# Patient Record
Sex: Female | Born: 1935 | Race: White | Hispanic: No | Smoking: Former smoker
Health system: Southern US, Community
[De-identification: ages and names within clinical notes are randomized; demographics above are authoritative.]

## PROBLEM LIST (undated history)

## (undated) DIAGNOSIS — R053 Chronic cough: Secondary | ICD-10-CM

## (undated) DIAGNOSIS — Z79899 Other long term (current) drug therapy: Secondary | ICD-10-CM

## (undated) DIAGNOSIS — C439 Malignant melanoma of skin, unspecified: Secondary | ICD-10-CM

## (undated) DIAGNOSIS — Z9981 Dependence on supplemental oxygen: Secondary | ICD-10-CM

## (undated) DIAGNOSIS — R519 Headache, unspecified: Secondary | ICD-10-CM

## (undated) DIAGNOSIS — I471 Supraventricular tachycardia, unspecified: Secondary | ICD-10-CM

## (undated) DIAGNOSIS — I209 Angina pectoris, unspecified: Secondary | ICD-10-CM

## (undated) DIAGNOSIS — F419 Anxiety disorder, unspecified: Secondary | ICD-10-CM

## (undated) DIAGNOSIS — Z8719 Personal history of other diseases of the digestive system: Secondary | ICD-10-CM

## (undated) DIAGNOSIS — J329 Chronic sinusitis, unspecified: Secondary | ICD-10-CM

## (undated) DIAGNOSIS — I639 Cerebral infarction, unspecified: Secondary | ICD-10-CM

## (undated) DIAGNOSIS — E119 Type 2 diabetes mellitus without complications: Secondary | ICD-10-CM

## (undated) DIAGNOSIS — R51 Headache: Secondary | ICD-10-CM

## (undated) DIAGNOSIS — IMO0001 Reserved for inherently not codable concepts without codable children: Secondary | ICD-10-CM

## (undated) DIAGNOSIS — Z5189 Encounter for other specified aftercare: Secondary | ICD-10-CM

## (undated) DIAGNOSIS — R55 Syncope and collapse: Secondary | ICD-10-CM

## (undated) DIAGNOSIS — I4891 Unspecified atrial fibrillation: Secondary | ICD-10-CM

## (undated) DIAGNOSIS — F32A Depression, unspecified: Secondary | ICD-10-CM

## (undated) DIAGNOSIS — I10 Essential (primary) hypertension: Secondary | ICD-10-CM

## (undated) DIAGNOSIS — I519 Heart disease, unspecified: Secondary | ICD-10-CM

## (undated) DIAGNOSIS — M1990 Unspecified osteoarthritis, unspecified site: Secondary | ICD-10-CM

## (undated) DIAGNOSIS — E7800 Pure hypercholesterolemia, unspecified: Secondary | ICD-10-CM

## (undated) DIAGNOSIS — R05 Cough: Secondary | ICD-10-CM

## (undated) DIAGNOSIS — D649 Anemia, unspecified: Secondary | ICD-10-CM

## (undated) DIAGNOSIS — J449 Chronic obstructive pulmonary disease, unspecified: Secondary | ICD-10-CM

## (undated) DIAGNOSIS — I82409 Acute embolism and thrombosis of unspecified deep veins of unspecified lower extremity: Secondary | ICD-10-CM

## (undated) DIAGNOSIS — F329 Major depressive disorder, single episode, unspecified: Secondary | ICD-10-CM

## (undated) HISTORY — PX: CARDIAC CATHETERIZATION: SHX172

## (undated) HISTORY — PX: BACK SURGERY: SHX140

## (undated) HISTORY — PX: LUMBAR SPINE SURGERY: SHX701

## (undated) HISTORY — PX: VESICOVAGINAL FISTULA CLOSURE W/ TAH: SUR271

## (undated) HISTORY — PX: PALATE SURGERY: SHX729

## (undated) HISTORY — PX: SMALL INTESTINE SURGERY: SHX150

## (undated) HISTORY — DX: Chronic obstructive pulmonary disease, unspecified: J44.9

## (undated) HISTORY — PX: KNEE CARTILAGE SURGERY: SHX688

## (undated) HISTORY — DX: Supraventricular tachycardia: I47.1

## (undated) HISTORY — DX: Acute embolism and thrombosis of unspecified deep veins of unspecified lower extremity: I82.409

## (undated) HISTORY — PX: ABDOMINAL HYSTERECTOMY: SHX81

## (undated) HISTORY — PX: APPENDECTOMY: SHX54

## (undated) HISTORY — PX: TONSILLECTOMY AND ADENOIDECTOMY: SUR1326

## (undated) HISTORY — PX: ROTATOR CUFF REPAIR: SHX139

## (undated) HISTORY — DX: Supraventricular tachycardia, unspecified: I47.10

## (undated) HISTORY — PX: LUNG BIOPSY: SHX232

---

## 1991-02-14 HISTORY — PX: THORACOTOMY: SUR1349

## 1998-07-15 ENCOUNTER — Encounter: Payer: Self-pay | Admitting: Pulmonary Disease

## 1998-07-15 ENCOUNTER — Ambulatory Visit (HOSPITAL_COMMUNITY): Admission: RE | Admit: 1998-07-15 | Discharge: 1998-07-15 | Payer: Self-pay | Admitting: Pulmonary Disease

## 1999-01-12 ENCOUNTER — Encounter: Admission: RE | Admit: 1999-01-12 | Discharge: 1999-01-12 | Payer: Self-pay | Admitting: Pulmonary Disease

## 1999-01-31 ENCOUNTER — Other Ambulatory Visit: Admission: RE | Admit: 1999-01-31 | Discharge: 1999-01-31 | Payer: Self-pay | Admitting: Obstetrics and Gynecology

## 1999-02-14 HISTORY — PX: CARDIAC ELECTROPHYSIOLOGY STUDY AND ABLATION: SHX1294

## 1999-06-15 ENCOUNTER — Inpatient Hospital Stay (HOSPITAL_COMMUNITY): Admission: EM | Admit: 1999-06-15 | Discharge: 1999-06-16 | Payer: Self-pay | Admitting: *Deleted

## 1999-07-25 ENCOUNTER — Ambulatory Visit (HOSPITAL_COMMUNITY): Admission: RE | Admit: 1999-07-25 | Discharge: 1999-07-26 | Payer: Self-pay | Admitting: Internal Medicine

## 1999-07-25 ENCOUNTER — Encounter: Payer: Self-pay | Admitting: Internal Medicine

## 1999-07-31 ENCOUNTER — Emergency Department (HOSPITAL_COMMUNITY): Admission: EM | Admit: 1999-07-31 | Discharge: 1999-07-31 | Payer: Self-pay

## 1999-09-13 ENCOUNTER — Encounter: Payer: Self-pay | Admitting: Obstetrics and Gynecology

## 1999-09-13 ENCOUNTER — Encounter: Admission: RE | Admit: 1999-09-13 | Discharge: 1999-09-13 | Payer: Self-pay | Admitting: Obstetrics and Gynecology

## 1999-09-20 ENCOUNTER — Encounter: Admission: RE | Admit: 1999-09-20 | Discharge: 1999-09-20 | Payer: Self-pay | Admitting: Obstetrics and Gynecology

## 1999-09-20 ENCOUNTER — Encounter: Payer: Self-pay | Admitting: Obstetrics and Gynecology

## 2000-01-19 ENCOUNTER — Other Ambulatory Visit: Admission: RE | Admit: 2000-01-19 | Discharge: 2000-01-19 | Payer: Self-pay | Admitting: Obstetrics and Gynecology

## 2000-08-13 HISTORY — PX: BREAST BIOPSY: SHX20

## 2000-08-22 ENCOUNTER — Encounter: Payer: Self-pay | Admitting: Obstetrics and Gynecology

## 2000-08-22 ENCOUNTER — Encounter: Admission: RE | Admit: 2000-08-22 | Discharge: 2000-08-22 | Payer: Self-pay | Admitting: Obstetrics and Gynecology

## 2000-09-04 ENCOUNTER — Encounter (INDEPENDENT_AMBULATORY_CARE_PROVIDER_SITE_OTHER): Payer: Self-pay | Admitting: Specialist

## 2000-09-04 ENCOUNTER — Ambulatory Visit (HOSPITAL_BASED_OUTPATIENT_CLINIC_OR_DEPARTMENT_OTHER): Admission: RE | Admit: 2000-09-04 | Discharge: 2000-09-04 | Payer: Self-pay | Admitting: *Deleted

## 2000-10-05 ENCOUNTER — Encounter: Payer: Self-pay | Admitting: Obstetrics and Gynecology

## 2000-10-05 ENCOUNTER — Encounter: Admission: RE | Admit: 2000-10-05 | Discharge: 2000-10-05 | Payer: Self-pay | Admitting: Obstetrics and Gynecology

## 2001-01-14 ENCOUNTER — Other Ambulatory Visit: Admission: RE | Admit: 2001-01-14 | Discharge: 2001-01-14 | Payer: Self-pay | Admitting: Obstetrics and Gynecology

## 2001-04-26 ENCOUNTER — Encounter: Payer: Self-pay | Admitting: Emergency Medicine

## 2001-04-27 ENCOUNTER — Emergency Department (HOSPITAL_COMMUNITY): Admission: EM | Admit: 2001-04-27 | Discharge: 2001-04-27 | Payer: Self-pay | Admitting: Emergency Medicine

## 2001-04-27 ENCOUNTER — Encounter: Payer: Self-pay | Admitting: Emergency Medicine

## 2001-05-02 ENCOUNTER — Encounter: Payer: Self-pay | Admitting: Gastroenterology

## 2001-05-02 ENCOUNTER — Encounter: Admission: RE | Admit: 2001-05-02 | Discharge: 2001-05-02 | Payer: Self-pay | Admitting: Gastroenterology

## 2001-12-26 ENCOUNTER — Encounter: Payer: Self-pay | Admitting: Pulmonary Disease

## 2001-12-26 ENCOUNTER — Encounter: Admission: RE | Admit: 2001-12-26 | Discharge: 2001-12-26 | Payer: Self-pay | Admitting: Pulmonary Disease

## 2001-12-27 ENCOUNTER — Encounter: Payer: Self-pay | Admitting: Pulmonary Disease

## 2001-12-27 ENCOUNTER — Ambulatory Visit (HOSPITAL_COMMUNITY): Admission: RE | Admit: 2001-12-27 | Discharge: 2001-12-27 | Payer: Self-pay | Admitting: Pulmonary Disease

## 2002-02-04 ENCOUNTER — Other Ambulatory Visit: Admission: RE | Admit: 2002-02-04 | Discharge: 2002-02-04 | Payer: Self-pay | Admitting: Gynecology

## 2002-03-13 ENCOUNTER — Emergency Department (HOSPITAL_COMMUNITY): Admission: EM | Admit: 2002-03-13 | Discharge: 2002-03-13 | Payer: Self-pay | Admitting: Emergency Medicine

## 2002-03-13 ENCOUNTER — Encounter: Payer: Self-pay | Admitting: Emergency Medicine

## 2002-04-10 LAB — PULMONARY FUNCTION TEST

## 2002-04-15 ENCOUNTER — Emergency Department (HOSPITAL_COMMUNITY): Admission: EM | Admit: 2002-04-15 | Discharge: 2002-04-15 | Payer: Self-pay | Admitting: Emergency Medicine

## 2002-04-15 ENCOUNTER — Encounter: Payer: Self-pay | Admitting: Emergency Medicine

## 2002-09-14 ENCOUNTER — Encounter: Payer: Self-pay | Admitting: Emergency Medicine

## 2002-09-14 ENCOUNTER — Emergency Department (HOSPITAL_COMMUNITY): Admission: EM | Admit: 2002-09-14 | Discharge: 2002-09-14 | Payer: Self-pay | Admitting: Emergency Medicine

## 2002-09-19 ENCOUNTER — Ambulatory Visit (HOSPITAL_COMMUNITY): Admission: RE | Admit: 2002-09-19 | Discharge: 2002-09-19 | Payer: Self-pay | Admitting: Gastroenterology

## 2002-09-19 ENCOUNTER — Encounter (INDEPENDENT_AMBULATORY_CARE_PROVIDER_SITE_OTHER): Payer: Self-pay | Admitting: *Deleted

## 2003-02-09 ENCOUNTER — Encounter: Admission: RE | Admit: 2003-02-09 | Discharge: 2003-02-09 | Payer: Self-pay | Admitting: Pulmonary Disease

## 2003-05-12 ENCOUNTER — Ambulatory Visit (HOSPITAL_COMMUNITY): Admission: RE | Admit: 2003-05-12 | Discharge: 2003-05-12 | Payer: Self-pay | Admitting: Internal Medicine

## 2003-08-29 ENCOUNTER — Emergency Department (HOSPITAL_COMMUNITY): Admission: EM | Admit: 2003-08-29 | Discharge: 2003-08-29 | Payer: Self-pay | Admitting: Emergency Medicine

## 2004-02-25 ENCOUNTER — Encounter: Admission: RE | Admit: 2004-02-25 | Discharge: 2004-02-25 | Payer: Self-pay | Admitting: Gynecology

## 2004-03-02 ENCOUNTER — Ambulatory Visit: Payer: Self-pay | Admitting: Internal Medicine

## 2004-06-21 ENCOUNTER — Ambulatory Visit: Payer: Self-pay | Admitting: Internal Medicine

## 2004-07-01 ENCOUNTER — Ambulatory Visit: Payer: Self-pay | Admitting: Internal Medicine

## 2004-07-19 ENCOUNTER — Ambulatory Visit (HOSPITAL_COMMUNITY): Admission: RE | Admit: 2004-07-19 | Discharge: 2004-07-19 | Payer: Self-pay | Admitting: Internal Medicine

## 2004-07-19 LAB — PULMONARY FUNCTION TEST

## 2004-07-29 ENCOUNTER — Ambulatory Visit: Payer: Self-pay | Admitting: Internal Medicine

## 2004-11-23 ENCOUNTER — Ambulatory Visit: Payer: Self-pay | Admitting: Internal Medicine

## 2005-03-01 ENCOUNTER — Encounter: Admission: RE | Admit: 2005-03-01 | Discharge: 2005-03-01 | Payer: Self-pay | Admitting: Gynecology

## 2005-03-10 ENCOUNTER — Encounter: Admission: RE | Admit: 2005-03-10 | Discharge: 2005-03-10 | Payer: Self-pay | Admitting: Gynecology

## 2005-03-22 ENCOUNTER — Ambulatory Visit: Payer: Self-pay | Admitting: Internal Medicine

## 2005-04-06 ENCOUNTER — Other Ambulatory Visit: Admission: RE | Admit: 2005-04-06 | Discharge: 2005-04-06 | Payer: Self-pay | Admitting: Gynecology

## 2005-07-04 ENCOUNTER — Emergency Department (HOSPITAL_COMMUNITY): Admission: EM | Admit: 2005-07-04 | Discharge: 2005-07-04 | Payer: Self-pay | Admitting: Emergency Medicine

## 2005-07-19 ENCOUNTER — Ambulatory Visit: Payer: Self-pay | Admitting: Internal Medicine

## 2005-11-15 ENCOUNTER — Ambulatory Visit: Payer: Self-pay | Admitting: Internal Medicine

## 2006-03-05 ENCOUNTER — Encounter: Admission: RE | Admit: 2006-03-05 | Discharge: 2006-03-05 | Payer: Self-pay | Admitting: Gynecology

## 2006-03-19 ENCOUNTER — Ambulatory Visit: Payer: Self-pay | Admitting: Internal Medicine

## 2006-09-17 ENCOUNTER — Ambulatory Visit: Payer: Self-pay | Admitting: Internal Medicine

## 2007-01-18 ENCOUNTER — Telehealth: Payer: Self-pay | Admitting: Internal Medicine

## 2007-03-08 ENCOUNTER — Encounter: Admission: RE | Admit: 2007-03-08 | Discharge: 2007-03-08 | Payer: Self-pay | Admitting: Gynecology

## 2007-03-19 DIAGNOSIS — J302 Other seasonal allergic rhinitis: Secondary | ICD-10-CM

## 2007-03-19 DIAGNOSIS — J3089 Other allergic rhinitis: Secondary | ICD-10-CM

## 2007-03-20 ENCOUNTER — Ambulatory Visit: Payer: Self-pay | Admitting: Internal Medicine

## 2007-03-20 DIAGNOSIS — J449 Chronic obstructive pulmonary disease, unspecified: Secondary | ICD-10-CM | POA: Insufficient documentation

## 2007-03-20 DIAGNOSIS — R55 Syncope and collapse: Secondary | ICD-10-CM

## 2007-03-24 DIAGNOSIS — E119 Type 2 diabetes mellitus without complications: Secondary | ICD-10-CM | POA: Insufficient documentation

## 2007-03-25 ENCOUNTER — Ambulatory Visit: Payer: Self-pay | Admitting: Internal Medicine

## 2007-04-02 ENCOUNTER — Telehealth: Payer: Self-pay | Admitting: Internal Medicine

## 2007-04-02 DIAGNOSIS — J329 Chronic sinusitis, unspecified: Secondary | ICD-10-CM | POA: Insufficient documentation

## 2007-04-11 ENCOUNTER — Ambulatory Visit: Payer: Self-pay | Admitting: Internal Medicine

## 2007-04-16 ENCOUNTER — Ambulatory Visit: Payer: Self-pay | Admitting: Internal Medicine

## 2007-05-02 ENCOUNTER — Encounter: Payer: Self-pay | Admitting: Internal Medicine

## 2007-05-04 ENCOUNTER — Ambulatory Visit: Payer: Self-pay | Admitting: Cardiology

## 2007-05-04 ENCOUNTER — Observation Stay (HOSPITAL_COMMUNITY): Admission: EM | Admit: 2007-05-04 | Discharge: 2007-05-05 | Payer: Self-pay | Admitting: Emergency Medicine

## 2007-05-07 ENCOUNTER — Emergency Department (HOSPITAL_COMMUNITY): Admission: EM | Admit: 2007-05-07 | Discharge: 2007-05-07 | Payer: Self-pay | Admitting: Emergency Medicine

## 2007-05-08 ENCOUNTER — Ambulatory Visit: Payer: Self-pay

## 2007-05-15 ENCOUNTER — Telehealth (INDEPENDENT_AMBULATORY_CARE_PROVIDER_SITE_OTHER): Payer: Self-pay | Admitting: *Deleted

## 2007-05-16 ENCOUNTER — Ambulatory Visit: Payer: Self-pay | Admitting: Internal Medicine

## 2007-05-20 ENCOUNTER — Ambulatory Visit: Payer: Self-pay | Admitting: Internal Medicine

## 2007-08-20 ENCOUNTER — Ambulatory Visit: Payer: Self-pay | Admitting: Internal Medicine

## 2007-09-19 ENCOUNTER — Ambulatory Visit: Payer: Self-pay | Admitting: Internal Medicine

## 2007-12-18 ENCOUNTER — Telehealth (INDEPENDENT_AMBULATORY_CARE_PROVIDER_SITE_OTHER): Payer: Self-pay | Admitting: *Deleted

## 2008-03-09 ENCOUNTER — Encounter: Admission: RE | Admit: 2008-03-09 | Discharge: 2008-03-09 | Payer: Self-pay | Admitting: Gynecology

## 2008-03-17 ENCOUNTER — Ambulatory Visit: Payer: Self-pay | Admitting: Internal Medicine

## 2008-04-13 HISTORY — PX: US ECHOCARDIOGRAPHY: HXRAD669

## 2008-05-14 ENCOUNTER — Encounter: Admission: RE | Admit: 2008-05-14 | Discharge: 2008-05-14 | Payer: Self-pay | Admitting: Orthopedic Surgery

## 2008-05-26 ENCOUNTER — Telehealth: Payer: Self-pay | Admitting: Internal Medicine

## 2008-06-01 ENCOUNTER — Encounter: Payer: Self-pay | Admitting: Internal Medicine

## 2008-07-05 ENCOUNTER — Ambulatory Visit: Payer: Self-pay | Admitting: Internal Medicine

## 2008-07-05 ENCOUNTER — Emergency Department (HOSPITAL_COMMUNITY): Admission: EM | Admit: 2008-07-05 | Discharge: 2008-07-05 | Payer: Self-pay | Admitting: Family Medicine

## 2008-07-05 ENCOUNTER — Inpatient Hospital Stay (HOSPITAL_COMMUNITY): Admission: EM | Admit: 2008-07-05 | Discharge: 2008-07-07 | Payer: Self-pay | Admitting: Emergency Medicine

## 2008-07-06 ENCOUNTER — Encounter (INDEPENDENT_AMBULATORY_CARE_PROVIDER_SITE_OTHER): Payer: Self-pay | Admitting: Internal Medicine

## 2008-07-07 ENCOUNTER — Encounter: Payer: Self-pay | Admitting: Internal Medicine

## 2008-08-21 ENCOUNTER — Emergency Department (HOSPITAL_COMMUNITY): Admission: EM | Admit: 2008-08-21 | Discharge: 2008-08-21 | Payer: Self-pay | Admitting: Emergency Medicine

## 2008-09-14 ENCOUNTER — Ambulatory Visit: Payer: Self-pay | Admitting: Internal Medicine

## 2008-11-26 ENCOUNTER — Ambulatory Visit: Payer: Self-pay | Admitting: Internal Medicine

## 2008-12-07 ENCOUNTER — Emergency Department (HOSPITAL_COMMUNITY): Admission: EM | Admit: 2008-12-07 | Discharge: 2008-12-07 | Payer: Self-pay | Admitting: Emergency Medicine

## 2008-12-07 ENCOUNTER — Telehealth: Payer: Self-pay | Admitting: Internal Medicine

## 2008-12-29 ENCOUNTER — Telehealth: Payer: Self-pay | Admitting: Internal Medicine

## 2008-12-30 ENCOUNTER — Ambulatory Visit: Payer: Self-pay | Admitting: Internal Medicine

## 2008-12-30 DIAGNOSIS — R079 Chest pain, unspecified: Secondary | ICD-10-CM | POA: Insufficient documentation

## 2008-12-31 ENCOUNTER — Ambulatory Visit: Payer: Self-pay | Admitting: Internal Medicine

## 2008-12-31 ENCOUNTER — Encounter: Payer: Self-pay | Admitting: Internal Medicine

## 2009-01-04 ENCOUNTER — Telehealth (INDEPENDENT_AMBULATORY_CARE_PROVIDER_SITE_OTHER): Payer: Self-pay | Admitting: *Deleted

## 2009-01-05 ENCOUNTER — Ambulatory Visit: Payer: Self-pay | Admitting: Cardiology

## 2009-01-05 ENCOUNTER — Encounter (HOSPITAL_COMMUNITY): Admission: RE | Admit: 2009-01-05 | Discharge: 2009-02-12 | Payer: Self-pay | Admitting: Internal Medicine

## 2009-01-05 ENCOUNTER — Ambulatory Visit: Payer: Self-pay

## 2009-02-09 ENCOUNTER — Encounter: Payer: Self-pay | Admitting: Internal Medicine

## 2009-03-08 ENCOUNTER — Emergency Department (HOSPITAL_COMMUNITY): Admission: EM | Admit: 2009-03-08 | Discharge: 2009-03-08 | Payer: Self-pay | Admitting: Emergency Medicine

## 2009-03-09 ENCOUNTER — Inpatient Hospital Stay (HOSPITAL_COMMUNITY): Admission: EM | Admit: 2009-03-09 | Discharge: 2009-03-10 | Payer: Self-pay | Admitting: Emergency Medicine

## 2009-03-09 ENCOUNTER — Ambulatory Visit: Payer: Self-pay | Admitting: Cardiovascular Disease

## 2009-03-09 ENCOUNTER — Encounter (INDEPENDENT_AMBULATORY_CARE_PROVIDER_SITE_OTHER): Payer: Self-pay | Admitting: Internal Medicine

## 2009-03-09 ENCOUNTER — Ambulatory Visit: Payer: Self-pay | Admitting: Vascular Surgery

## 2009-03-09 HISTORY — PX: TRANSTHORACIC ECHOCARDIOGRAM: SHX275

## 2009-03-22 ENCOUNTER — Encounter: Payer: Self-pay | Admitting: Internal Medicine

## 2009-03-23 ENCOUNTER — Ambulatory Visit: Payer: Self-pay | Admitting: Internal Medicine

## 2009-04-01 ENCOUNTER — Ambulatory Visit: Payer: Self-pay | Admitting: Internal Medicine

## 2009-04-01 LAB — CONVERTED CEMR LAB
BUN: 16 mg/dL (ref 6–23)
Eosinophils Relative: 0.1 % (ref 0.0–5.0)
GFR calc non Af Amer: 86.53 mL/min (ref >60)
HCT: 36.2 % (ref 36.0–46.0)
Lymphs Abs: 1.2 10*3/uL (ref 0.7–4.0)
Monocytes Relative: 10.2 % (ref 3.0–12.0)
Neutrophils Relative %: 48.4 % (ref 43.0–77.0)
Platelets: 176 10*3/uL (ref 150.0–400.0)
Potassium: 5.1 meq/L (ref 3.5–5.1)
Prothrombin Time: 12.1 s — ABNORMAL HIGH (ref 9.1–11.7)
RBC: 3.65 M/uL — ABNORMAL LOW (ref 3.87–5.11)
Sodium: 140 meq/L (ref 135–145)
WBC: 2.9 10*3/uL — ABNORMAL LOW (ref 4.5–10.5)
aPTT: 29.5 s — ABNORMAL HIGH (ref 21.7–28.8)

## 2009-04-02 ENCOUNTER — Encounter: Admission: RE | Admit: 2009-04-02 | Discharge: 2009-04-02 | Payer: Self-pay | Admitting: Gynecology

## 2009-04-05 ENCOUNTER — Encounter: Payer: Self-pay | Admitting: Internal Medicine

## 2009-04-08 ENCOUNTER — Ambulatory Visit (HOSPITAL_COMMUNITY): Admission: RE | Admit: 2009-04-08 | Discharge: 2009-04-08 | Payer: Self-pay | Admitting: Internal Medicine

## 2009-04-08 ENCOUNTER — Ambulatory Visit: Payer: Self-pay | Admitting: Internal Medicine

## 2009-04-09 HISTORY — PX: LOOP RECORDER IMPLANT: SHX5954

## 2009-04-12 ENCOUNTER — Encounter: Payer: Self-pay | Admitting: Internal Medicine

## 2009-04-14 ENCOUNTER — Ambulatory Visit: Payer: Self-pay | Admitting: Internal Medicine

## 2009-04-20 ENCOUNTER — Observation Stay (HOSPITAL_COMMUNITY): Admission: EM | Admit: 2009-04-20 | Discharge: 2009-04-20 | Payer: Self-pay | Admitting: Emergency Medicine

## 2009-04-22 ENCOUNTER — Encounter: Payer: Self-pay | Admitting: Internal Medicine

## 2009-04-22 ENCOUNTER — Ambulatory Visit: Payer: Self-pay

## 2009-04-26 ENCOUNTER — Telehealth: Payer: Self-pay | Admitting: Internal Medicine

## 2009-05-12 ENCOUNTER — Ambulatory Visit: Payer: Self-pay | Admitting: Internal Medicine

## 2009-07-23 ENCOUNTER — Encounter: Payer: Self-pay | Admitting: Internal Medicine

## 2009-07-23 ENCOUNTER — Ambulatory Visit: Payer: Self-pay

## 2009-08-19 ENCOUNTER — Emergency Department (HOSPITAL_COMMUNITY): Admission: EM | Admit: 2009-08-19 | Discharge: 2009-08-19 | Payer: Self-pay | Admitting: Emergency Medicine

## 2009-09-02 ENCOUNTER — Encounter: Payer: Self-pay | Admitting: Internal Medicine

## 2009-09-09 ENCOUNTER — Ambulatory Visit: Payer: Self-pay | Admitting: Internal Medicine

## 2009-12-08 ENCOUNTER — Ambulatory Visit: Payer: Self-pay | Admitting: Internal Medicine

## 2009-12-15 ENCOUNTER — Encounter: Payer: Self-pay | Admitting: Internal Medicine

## 2009-12-28 ENCOUNTER — Ambulatory Visit: Payer: Self-pay | Admitting: Internal Medicine

## 2010-03-05 ENCOUNTER — Encounter: Payer: Self-pay | Admitting: Pulmonary Disease

## 2010-03-06 ENCOUNTER — Encounter: Payer: Self-pay | Admitting: Gynecology

## 2010-03-06 ENCOUNTER — Encounter: Payer: Self-pay | Admitting: Gastroenterology

## 2010-03-08 ENCOUNTER — Ambulatory Visit: Admit: 2010-03-08 | Payer: Self-pay | Admitting: Internal Medicine

## 2010-03-09 ENCOUNTER — Telehealth: Payer: Self-pay | Admitting: Internal Medicine

## 2010-03-15 NOTE — Progress Notes (Signed)
  Phone Note Refill Request Message from:  Fax from Pharmacy  Refills Requested: Medication #1:  METOPROLOL TARTRATE 25 MG TABS Take one half  tablet by mouth twice a day.   Notes: #[facility] [address] phone:[phone] fax: [phone] Fax request sent in Dr. Roxy Cedar name. Sent fax back with note stating responsible provider for this medication is Dr. Ladona Ridgel. Zackery Barefoot CMA  April 26, 2009 12:25 PM      Prescriptions: METOPROLOL TARTRATE 25 MG TABS (METOPROLOL TARTRATE) Take one half  tablet by mouth twice a day  #30 x 3   Entered by:   Laurance Flatten CMA   Authorized by:   Laren Boom, MD, [facility]   Signed by:   Laurance Flatten CMA on 04/26/2009   Method used:   [facility]ronically to        [facility] [address] [phone]* (retail)       [address]       [place], [place]  [address]       Ph: [phone] or [phone]       Fax: [phone]   RxID:   [id number]

## 2010-03-15 NOTE — Cardiovascular Report (Signed)
Summary: Office Visit   Office Visit   Imported By: Roderic Ovens 12/31/2009 16:26:44  _____________________________________________________________________  External Attachment:    Type:   Image     Comment:   External Document
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
## 2010-03-15 NOTE — Cardiovascular Report (Signed)
Summary: Office Visit   Office Visit   Imported By: Roderic Ovens 05/21/2009 14:02:28  _____________________________________________________________________  External Attachment:    Type:   Image     Comment:   External Document

## 2010-03-15 NOTE — Consult Note (Signed)
Summary: [facility]  [facility]   Imported By: Lester Holmen 02/16/2009 09:21:13  _____________________________________________________________________  External Attachment:    Type:   Image     Comment:   External Document

## 2010-03-15 NOTE — Procedures (Signed)
Summary: wch/ gd      Allergies Added:   Current Medications (verified): 1)  Singulair 10 Mg Tabs (Montelukast Sodium) .... Take 1 Tablet By Mouth Once A Day 2)  Proair Hfa 108 (90 Base) Mcg/act  Aers (Albuterol Sulfate) .... 2 Puffs Four Times A Day As Needed 3)  Spiriva Handihaler 18 Mcg  Caps (Tiotropium Bromide Monohydrate) .... Inhale 1 Capsule Once Daily; Do Not Swallow Capsule 4)  Tylenol Extra Strength 500 Mg Tabs (Acetaminophen) .... Use As Directed As Needed 5)  Actos 30 Mg Tabs (Pioglitazone Hcl) .... Take One Tablet By Mouth Once Daily. 6)  Af Allergy Relief 10 Mg Tbdp (Loratadine) .... Take One Tablet By Mouth Once Daily. 7)  Promethazine-Codeine 6.25-10 Mg/107ml Syrp (Promethazine-Codeine) .Marland Kitchen.. 1 Teaspoon Four Times A Day As Needed Cough 8)  Pravastatin Sodium 40 Mg Tabs (Pravastatin Sodium) .... Take One Tablet By Mouth Daily At Bedtime  Allergies (verified): 1)  ! * Contrast Dye 2)  ! Epinephrine   ILR Following MD [name] Bunting, MD DOI:  04/08/2009 Vendor:  [employer] Jude     Model Number:  [id number]     Serial Number [id number]       Tachy Episodes:  3     ILR Next Due 07/14/2009  Tech Comments:  Steri strips removed.  Incision well healed.   There were 73 tachy episodes recorded all but 3 were noise.  Episodes showed SVT 5-9 seconds with rates up to 190bpm.  [name] has not had any syncopal episodes since the loop was placed.  She was instructed to call us if she does and make a recording if possible.  Altha Harm, LPN  April 22, 2009 4:14 PM   MD Comments:  Will followup her SVT.
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
## 2010-03-15 NOTE — Cardiovascular Report (Signed)
Summary: Pre Op Orders  Pre Op Orders   Imported By: Roderic Ovens 03/31/2009 14:17:54  _____________________________________________________________________  External Attachment:    Type:   Image     Comment:   External Document
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
## 2010-03-15 NOTE — Miscellaneous (Signed)
Summary: Device preload  Clinical Lists Changes  Observations: Added new observation of ILR SERIAL: [id number]  (04/12/2009 13:58) Added new observation of ILR MODEL: [id number]  (04/12/2009 13:58) Added new observation of ILR VENDOR: St Jude  (04/12/2009 13:58) Added new observation of ILR DTOFINS: 04/08/2009  (04/12/2009 13:58) Added new observation of ILR MD: [name], MD  (04/12/2009 13:58)       ILR Following MD [name], MD DOI:  04/08/2009 Vendor:  [employer] Jude     Model Number:  [id number]     Serial Number [id number]
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
## 2010-03-15 NOTE — Miscellaneous (Signed)
  Clinical Lists Changes  Observations: Added new observation of ECHOINTERP:   Study Conclusions    - Left ventricle: Septal and inferior wall hypokinesis The cavity     size was mildly dilated. Wall thickness was normal. Systolic     function was mildly reduced. The estimated ejection fraction was     in the range of 45% to 50%.   - Mitral valve: Calcified annulus. Mildly thickened leaflets .   - Left atrium: The atrium was mildly dilated.   - Atrial septum: No defect or patent foramen ovale was identified.   Transthoracic echocardiography. M-mode, complete 2D, spectral   Doppler, and color Doppler. Patient status: Inpatient. Location:   ICU/CCU   Prepared and Electronically Authenticated by    [name], MD, [facility]   2011-01-25T18:[id number]  (03/09/2009 13:14)      Echocardiogram  Procedure date:  03/09/2009  Findings:        Study Conclusions    - Left ventricle: Septal and inferior wall hypokinesis The cavity     size was mildly dilated. Wall thickness was normal. Systolic     function was mildly reduced. The estimated ejection fraction was     in the range of 45% to 50%.   - Mitral valve: Calcified annulus. Mildly thickened leaflets .   - Left atrium: The atrium was mildly dilated.   - Atrial septum: No defect or patent foramen ovale was identified.   Transthoracic echocardiography. M-mode, complete 2D, spectral   Doppler, and color Doppler. Patient status: Inpatient. Location:   ICU/CCU   Prepared and Electronically Authenticated by    [name], MD, [facility]   2011-01-25T18:12:[id number]

## 2010-03-15 NOTE — Letter (Signed)
Summary: Implantable Device Instructions  Architectural technologist, Main Office  [address]   [place], [place] [address]   Phone: [phone]  Fax: [phone]      Implantable Device Instructions  You are scheduled for:  _____ Implantable Loop Recorder   on 2/24/11with Dr. Ladona Ridgel  1.  Please arrive at the Short Stay Center at [facility] at 11:00am on the day of your procedure.  2.  Do not eat or drink after midnight the night before your procedure.  3.  Complete lab work on 04/01/09.  The lab at [facility] is open from 8:30 AM to 1:30 PM and from 2:30 PM to 5:00 PM. You do not have to be fasting.  4.  Do NOT take these medications for the day of your procedure: .  5.  Plan for an overnight stay.  Bring your insurance cards and a list of your medications.  6.  Wash your chest and neck with antibacterial soap (any brand) the evening before and the morning of your procedure.  Rinse well.   *If you have ANY questions after you get home, please call the office [phone]. [name]  *Every attempt is made to prevent procedures from being rescheduled.  Due to the nauture of Electrophysiology, rescheduling can happen.  The physician is always aware and directs the staff when this occurs.

## 2010-03-15 NOTE — Letter (Signed)
Summary: [facility]ology [facility] Diabetes  [facility]   Imported By: Sherian Rein 12/30/2009 12:41:21  _____________________________________________________________________  External Attachment:    Type:   Image     Comment:   External Document
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
## 2010-03-15 NOTE — Medication Information (Signed)
Summary: Prometh/Codeine/[facility]  Prometh/Codeine/[facility]   Imported By: Sherian Rein 04/09/2009 09:17:11  _____________________________________________________________________  External Attachment:    Type:   Image     Comment:   External Document

## 2010-03-15 NOTE — Cardiovascular Report (Signed)
Summary: Office Visit   Office Visit   Imported By: Roderic Ovens 08/07/2009 12:17:49  _____________________________________________________________________  External Attachment:    Type:   Image     Comment:   External Document
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
## 2010-03-15 NOTE — Procedures (Signed)
Summary: device/saf    Allergies: 1)  ! * Contrast Dye 2)  ! Epinephrine   ILR Following MD [name], MD DOI:  04/08/2009 Vendor:  [employer] Jude     Model Number:  [id number]     Serial Number [id number]       Tachy Episodes:  73     Brady Episodes:  0 ILR Next Due 10/14/2009  Tech Comments:  All 73 tachy episodes oversensing.  Battery longevity >36 mths.  Changed Sensitivity from 0.09 to 0.33mV and EGM Dynamic Range from 0.37mV to 0.10mV. Checked by [employer].  Vella Kohler  July 28, 2009 8:47 AM

## 2010-03-15 NOTE — Letter (Signed)
Summary: [place] [facility] Office Progress Note    Endo Office Progress Note   Imported By: Roderic Ovens 09/22/2009 11:43:44  _____________________________________________________________________  External Attachment:    Type:   Image     Comment:   External Document

## 2010-03-17 NOTE — Letter (Signed)
Summary: [place] [facility] Office Progress Note   [facility] Progress Note   Imported By: Roderic Ovens 02/01/2010 16:21:06  _____________________________________________________________________  External Attachment:    Type:   Image     Comment:   External Document

## 2010-03-17 NOTE — Progress Notes (Signed)
Summary: nos appt  Phone Note Call from Patient   Caller: [email or web address]  Call For: Tennis Mckinnon Summary of Call: [facility] nos from 1/24 to 2/28. Initial call taken by: Darletta Moll,  March 09, 2010 11:17 AM

## 2010-03-31 ENCOUNTER — Encounter (INDEPENDENT_AMBULATORY_CARE_PROVIDER_SITE_OTHER): Payer: Medicare Other

## 2010-03-31 ENCOUNTER — Encounter: Payer: Self-pay | Admitting: Internal Medicine

## 2010-03-31 DIAGNOSIS — R55 Syncope and collapse: Secondary | ICD-10-CM

## 2010-04-01 ENCOUNTER — Emergency Department (HOSPITAL_COMMUNITY): Payer: Medicare Other

## 2010-04-01 ENCOUNTER — Inpatient Hospital Stay (HOSPITAL_COMMUNITY)
Admission: EM | Admit: 2010-04-01 | Discharge: 2010-04-04 | DRG: 189 | Disposition: A | Discharge status: Home / Self Care | Payer: Medicare Other | Attending: Pulmonary Disease | Admitting: Pulmonary Disease

## 2010-04-01 DIAGNOSIS — J309 Allergic rhinitis, unspecified: Secondary | ICD-10-CM | POA: Diagnosis present

## 2010-04-01 DIAGNOSIS — Z85828 Personal history of other malignant neoplasm of skin: Secondary | ICD-10-CM

## 2010-04-01 DIAGNOSIS — J9600 Acute respiratory failure, unspecified whether with hypoxia or hypercapnia: Principal | ICD-10-CM | POA: Diagnosis present

## 2010-04-01 DIAGNOSIS — J069 Acute upper respiratory infection, unspecified: Secondary | ICD-10-CM | POA: Diagnosis present

## 2010-04-01 DIAGNOSIS — G43909 Migraine, unspecified, not intractable, without status migrainosus: Secondary | ICD-10-CM | POA: Diagnosis present

## 2010-04-01 DIAGNOSIS — E119 Type 2 diabetes mellitus without complications: Secondary | ICD-10-CM | POA: Diagnosis present

## 2010-04-01 DIAGNOSIS — E785 Hyperlipidemia, unspecified: Secondary | ICD-10-CM | POA: Diagnosis present

## 2010-04-01 DIAGNOSIS — J441 Chronic obstructive pulmonary disease with (acute) exacerbation: Secondary | ICD-10-CM | POA: Diagnosis present

## 2010-04-01 DIAGNOSIS — J019 Acute sinusitis, unspecified: Secondary | ICD-10-CM | POA: Diagnosis present

## 2010-04-01 DIAGNOSIS — I10 Essential (primary) hypertension: Secondary | ICD-10-CM | POA: Diagnosis present

## 2010-04-01 LAB — DIFFERENTIAL
Basophils Absolute: 0 10*3/uL (ref 0.0–0.1)
Basophils Relative: 1 % (ref 0–1)
Eosinophils Absolute: 0 10*3/uL (ref 0.0–0.7)
Monocytes Relative: 11 % (ref 3–12)
Neutrophils Relative %: 39 % — ABNORMAL LOW (ref 43–77)

## 2010-04-01 LAB — POCT I-STAT 3, ART BLOOD GAS (G3+)
O2 Saturation: 99 %
Patient temperature: 98.6
TCO2: 28 mmol/L (ref 0–100)
pH, Arterial: 7.294 — ABNORMAL LOW (ref 7.350–7.400)
pH, Arterial: 7.391 (ref 7.350–7.400)

## 2010-04-01 LAB — BASIC METABOLIC PANEL
BUN: 19 mg/dL (ref 6–23)
CO2: 27 mEq/L (ref 19–32)
Chloride: 103 mEq/L (ref 96–112)
Creatinine, Ser: 1.08 mg/dL (ref 0.4–1.2)

## 2010-04-01 LAB — CBC
MCH: 32.7 pg (ref 26.0–34.0)
Platelets: 203 10*3/uL (ref 150–400)
RBC: 3.67 MIL/uL — ABNORMAL LOW (ref 3.87–5.11)
WBC: 4.7 10*3/uL (ref 4.0–10.5)

## 2010-04-01 LAB — POCT CARDIAC MARKERS: Troponin i, poc: <0.05 ng/mL (ref 0.00–0.09)

## 2010-04-02 DIAGNOSIS — J441 Chronic obstructive pulmonary disease with (acute) exacerbation: Secondary | ICD-10-CM

## 2010-04-02 LAB — GLUCOSE, CAPILLARY
Glucose-Capillary: 91 mg/dL (ref 70–99)
Glucose-Capillary: 161 mg/dL — ABNORMAL HIGH (ref 70–99)
Glucose-Capillary: 132 mg/dL — ABNORMAL HIGH (ref 70–99)
Glucose-Capillary: 134 mg/dL — ABNORMAL HIGH (ref 70–99)

## 2010-04-02 LAB — BASIC METABOLIC PANEL
BUN: 16 mg/dL (ref 6–23)
Calcium: 9.2 mg/dL (ref 8.4–10.5)
GFR calc non Af Amer: >60 mL/min (ref >60)
Glucose, Bld: 184 mg/dL — ABNORMAL HIGH (ref 70–99)

## 2010-04-02 LAB — HEMOGLOBIN A1C: Hgb A1c MFr Bld: 6.3 % — ABNORMAL HIGH (ref <5.7)

## 2010-04-02 LAB — POCT I-STAT 3, ART BLOOD GAS (G3+)
O2 Saturation: 96 %
Patient temperature: 98.6
TCO2: 28 mmol/L (ref 0–100)

## 2010-04-02 LAB — CARDIAC PANEL(CRET KIN+CKTOT+MB+TROPI): Troponin I: 0.01 ng/mL (ref 0.00–0.06)

## 2010-04-02 LAB — CBC
HCT: 33.5 % — ABNORMAL LOW (ref 36.0–46.0)
MCHC: 33.4 g/dL (ref 30.0–36.0)
MCV: 98 fL (ref 78.0–100.0)
RDW: 14.1 % (ref 11.5–15.5)

## 2010-04-03 LAB — GLUCOSE, CAPILLARY
Glucose-Capillary: 128 mg/dL — ABNORMAL HIGH (ref 70–99)
Glucose-Capillary: 91 mg/dL (ref 70–99)
Glucose-Capillary: 106 mg/dL — ABNORMAL HIGH (ref 70–99)
Glucose-Capillary: 200 mg/dL — ABNORMAL HIGH (ref 70–99)

## 2010-04-04 ENCOUNTER — Telehealth (INDEPENDENT_AMBULATORY_CARE_PROVIDER_SITE_OTHER): Payer: Self-pay | Admitting: *Deleted

## 2010-04-04 LAB — GLUCOSE, CAPILLARY
Glucose-Capillary: 198 mg/dL — ABNORMAL HIGH (ref 70–99)
Glucose-Capillary: 92 mg/dL (ref 70–99)

## 2010-04-04 LAB — BASIC METABOLIC PANEL
BUN: 16 mg/dL (ref 6–23)
Creatinine, Ser: 0.79 mg/dL (ref 0.4–1.2)
GFR calc non Af Amer: >60 mL/min (ref >60)
Glucose, Bld: 101 mg/dL — ABNORMAL HIGH (ref 70–99)
Potassium: 3.7 mEq/L (ref 3.5–5.1)

## 2010-04-05 ENCOUNTER — Encounter: Payer: Self-pay | Admitting: Internal Medicine
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
## 2010-04-07 NOTE — Discharge Summary (Addendum)
NAMEHISAYO, [name]               ACCOUNT NO.:  [id number]  MEDICAL RECORD NO.:  [id number]           PATIENT TYPE:  I  LOCATION:  5153                         FACILITY:  [facility]  PHYSICIAN:  Clinton D. Maple Hudson, MD, FCCP, FACPDATE OF BIRTH:  09[birth year]Jun-1933  DATE OF ADMISSION:  04/01/2010 DATE OF DISCHARGE:  04/04/2010                              DISCHARGE SUMMARY   DISCHARGE DIAGNOSES: 1. Acute hypercapnic respiratory failure. 2. Acute chronic obstructive pulmonary disease exacerbation.  LABORATORY DATA:  On April 04, 2010, BMP demonstrates sodium 138, potassium 3.7, chloride 104, CO2 28, glucose 101, BUN 16, creatinine 0.79, calcium 9.4.  On April 02, 2010, hemoglobin A1c was 6.3.  On April 02, 2010, CBC demonstrates WBC 3.8, hemoglobin 11.2, hematocrit 33.5, platelet count 172.  MICROBIOLOGY DATA:  On April 01, 2010, blood cultures x2 demonstrate no growth on preliminary results, final results pending.  On April 02, 2010, MRSA nasal PCR was negative.  RADIOLOGIC DATA:  On April 01, 2010, 1 view of the chest demonstrates changes consistent with COPD with no acute infiltrate or abnormality.  HISTORY OF PRESENT ILLNESS:  [name] is a 77 year old female with a past medical history of diabetes, COPD/asthma, hypertension, dyslipidemia, history of syncopal episodes status post implantable loop recorder, seasonal allergies, allergic rhinitis, and sinusitis, who was admitted to [facility] on April 01, 2010, secondary to worsening shortness of breath, chest tightness, and hypercarbic respiratory failure.  Her admission ABG demonstrated a pH of 7.29, PCO2 of 16, and PO2 of 155 with bicarbonate of 29.3.  At that time, she was not able to speak in full sentences and required BiPAP therapy.  She was placed on IV Avelox and IV Solu-Medrol with nebulized bronchodilators and made significant improvements over the course of her hospital admission.  The night prior to  discharge, she did have an episode consistent with an aura for migraine on the morning of discharge, is now resolved without numbness, headache, or residual pain.  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  She will continue on Avelox therapy for a total of 7 days post discharge and complete prednisone taper.  She is scheduled to follow up with Dr. Jetty Duhamel on April 12, 2010. Please see below.  HOSPITAL COURSE BY DISCHARGE DIAGNOSES: 1. Acute hypercapnic respiratory failure.  As per HPI, [name]     presented to [facility] with worsening shortness of breath     and chest tightness that was likely consistent with acute COPD     exacerbation.  Her initial ABG demonstrated pH of 7.29 with CO2 of     60 and a PO2 of 155.  At the time of presentation, she was unable     to complete full sentences and was in significant distress.  She     did require initiation of BiPAP therapy which did significantly     improve her work of breathing.  She was placed on IV Avelox and IV     Solu-Medrol with nebulized bronchodilators.  At the time of     discharge, she was transitioned from IV medications to p.o.  She     will continue post discharge on Avelox and prednisone taper. 2. Acute COPD exacerbation.  Please see above for details.  DISCHARGE INSTRUCTIONS: 1. Activity, increase activity slowly and as tolerated. 2. Heart-healthy diet.  FOLLOWUP:  She is scheduled to follow up with Dr. Jetty Duhamel, [facility], on April 12, 2010, at 1:45 p.m.  DISCHARGE MEDICATIONS: 1. Fluticasone 50-mcg spray, 2 sprays nasally daily.  This will be     continued post hospitalization, however, she will not be given a     script.  If she feels this significantly improves symptoms, we will     continue with a prescription after evaluation in the office.  Her     hospital Flonase was given to her at  the time of discharge. 2. Avelox 400 mg by mouth daily. 3. Prednisone 10-mg tablet 4 tablets daily for 3 days; then 2 tablets     daily for 3 days; then 1 tab daily for 3 days; and stop. 4. Nasal saline spray, 1 spray nasally as needed. 5. Actos 30 mg 1 tablet by mouth daily. 6. Loratadine 10 mg 1 tablet by mouth daily. 7. Losartan 50 mg 1 tablet by mouth daily. 8. Multivitamin OTC 1 tablet by mouth daily. 9. Naproxen sodium 220 mg 1 tablet by mouth daily as needed for pain. 10.Pravachol 40 mg by mouth daily. 11.ProAir 1 puff inhaled twice daily as needed for shortness of breath     or wheezing. 12.Singulair 10 mg 1 tablet by mouth daily. 13.Tylenol Extra Strength 500 mg 1 tablet by mouth every 4 hours as     needed for pain. 14.Spiriva 18 mcg 1 capsule inhaled daily at bedtime. 15.Vitamin D2 50,000 units 1 tablet by mouth every Saturday.  DISPOSITION:  At the time of discharge, [name] has met maximum benefit of inpatient therapy and is currently medically stable and cleared for discharge pending followup as mentioned above.  Time spent on disposition greater than 35 minutes.     Canary Brim, NP   ______________________________ Rennis Chris. Maple Hudson, MD, FCCP, FACP    BO/MEDQ  D:  04/04/2010  T:  04/05/2010  Job:  [id number]  Electronically Signed by Canary Brim  on 04/07/2010 02:34:45 PM Electronically Signed by Jetty Duhamel MD FCCP FACP on 04/12/2010 10:17:07 PM

## 2010-04-08 LAB — CULTURE, BLOOD (ROUTINE X 2): Culture: NO GROWTH

## 2010-04-12 ENCOUNTER — Encounter: Payer: Self-pay | Admitting: Internal Medicine

## 2010-04-12 ENCOUNTER — Ambulatory Visit (INDEPENDENT_AMBULATORY_CARE_PROVIDER_SITE_OTHER): Payer: Medicare Other | Admitting: Internal Medicine

## 2010-04-12 DIAGNOSIS — J9610 Chronic respiratory failure, unspecified whether with hypoxia or hypercapnia: Secondary | ICD-10-CM

## 2010-04-12 DIAGNOSIS — J449 Chronic obstructive pulmonary disease, unspecified: Secondary | ICD-10-CM

## 2010-04-12 NOTE — Progress Notes (Signed)
Summary: pharmacy calling/ pt is there waiting  Phone Note From Pharmacy   Caller: elizabeth w/ [facility] Call For: young  Summary of Call: per caller: pt is at pharmacy now and disputes the directions re: prednisone. caller says this is a new rx dated today. [phone] Initial call taken by: Tivis Ringer, CNA,  April 04, 2010 3:38 PM  Follow-up for Phone Call        I spoke with [facility]Elizabeth states that the son of the pt is questioning the Prednisone RX-I had the pharmacy fax over the Rx as I didnt see any in EMR-pt was d/c from hospital today-the Rx given by Canary Brim is correct; called the pharmacy to confirm this. Reynaldo Minium CMA  April 04, 2010 4:01 PM

## 2010-04-15 ENCOUNTER — Telehealth (INDEPENDENT_AMBULATORY_CARE_PROVIDER_SITE_OTHER): Payer: Self-pay | Admitting: *Deleted
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
## 2010-04-21 NOTE — Letter (Signed)
Summary: [facility]  [facility]   Imported By: Lennie Odor 04/12/2010 14:18:13  _____________________________________________________________________  External Attachment:    Type:   Image     Comment:   External Document

## 2010-04-21 NOTE — Progress Notes (Signed)
Summary: increased sob  Phone Note Call from Patient Call back at Home Phone [phone]   Caller: Patient Call For: young Reason for Call: Talk to Nurse Summary of Call: Patient calling states she is very sob starting today around lunch time.  She recently was in hospital for 4 days at the end of Feb.  She is asking if there is something preventive she could do or take to help with sob, she does not want to go back to the hospital. [facility] Initial call taken by: Lehman Prom,  April 15, 2010 2:59 PM  Follow-up for Phone Call        Spoke with pt.  She states that she started to have some increased SOB arounf noon today.  She states that she gets out of breath with or without any exertion.  She has had very little dry cough. Denies any fever, wheeze, CP, or other c/o's.  She states that she uses proair and this helps, but not for too long.  No openings this pm.  Pls advise thanks allergic to contrast dye and epinephrine Follow-up by: Vernie Murders,  April 15, 2010 3:31 PM  Additional Follow-up for Phone Call Additional follow up Details #1::        per CY---prednisone 20mg    #3  1 daily through the weekend.  no refills.  thanks Randell Loop CMA  April 15, 2010 3:36 PM     Additional Follow-up for Phone Call Additional follow up Details #2::    Spoke with pt and notified of recs per CDY.  She verbalized understanding and rx was sent to pharm.  Follow-up by: Vernie Murders,  April 15, 2010 4:09 PM  New/Updated Medications: PREDNISONE 20 MG TABS (PREDNISONE) 1 by mouth once daily Prescriptions: PREDNISONE 20 MG TABS (PREDNISONE) 1 by mouth once daily  #3 x 0   Entered by:   Vernie Murders   Authorized by:   Waymon Budge MD   Signed by:   Vernie Murders on 04/15/2010   Method used:   [facility]        [facility] [address] [phone]* (retail)       [id number]       [place], [place]  [address]       Ph: [phone] or [phone]  Fax: [phone]   RxID:   [id number]

## 2010-04-21 NOTE — Procedures (Signed)
Summary: device check/sl  [facility]      Allergies Added:   Current Medications (verified): 1)  Singulair 10 Mg Tabs (Montelukast Sodium) .... Take 1 Tablet By Mouth Once A Day 2)  Proair Hfa 108 (90 Base) Mcg/act  Aers (Albuterol Sulfate) .... 2 Puffs Four Times A Day As Needed 3)  Spiriva Handihaler 18 Mcg  Caps (Tiotropium Bromide Monohydrate) .... Inhale 1 Capsule Once Daily; Do Not Swallow Capsule 4)  Tylenol Extra Strength 500 Mg Tabs (Acetaminophen) .... Use As Directed As Needed 5)  Actos 30 Mg Tabs (Pioglitazone Hcl) .... Take One Tablet By Mouth Once Daily. 6)  Af Allergy Relief 10 Mg Tbdp (Loratadine) .... Take One Tablet By Mouth Once Daily. 7)  Pravastatin Sodium 40 Mg Tabs (Pravastatin Sodium) .... Take One Tablet By Mouth Daily At Bedtime 8)  Losartan Potassium 50 Mg Tabs (Losartan Potassium) .... Take One Tablet By Mouth Once Daily. 9)  Vitamin D .... Weekly 10)  Metoprolol Tartrate 25 Mg Tabs (Metoprolol Tartrate) .... 1/2 Tablet By Mouth Two Times A Day  Allergies (verified): 1)  ! * Contrast Dye 2)  ! Epinephrine   ILR Following MD [name], MD DOI:  04/08/2009 Vendor:  [employer]     Model Number:  [id number]     Serial Number [id number]       Tachy Episodes:  49     Brady Episodes:  24 ILR Next Due 06/14/2010  Tech Comments:  49 TACHY EPISODES AND 24 BRADY EPISODES.  NOT TRUE EPISODES. ROV IN 3 MTHS W/DEVICE CLINIC. Vella Kohler  April 06, 2010 4:55 PM

## 2010-04-26 NOTE — Letter (Signed)
Summary: [facility]  [facility]   Imported By: Marylou Mccoy 04/22/2010 13:01:39  _____________________________________________________________________  External Attachment:    Type:   Image     Comment:   External Document

## 2010-04-26 NOTE — Cardiovascular Report (Signed)
Summary: Office Visit   Office Visit   Imported By: Roderic Ovens 04/22/2010 14:54:37  _____________________________________________________________________  External Attachment:    Type:   Image     Comment:   External Document

## 2010-05-01 LAB — DIFFERENTIAL
Lymphocytes Relative: 29 % (ref 12–46)
Lymphs Abs: 2 10*3/uL (ref 0.7–4.0)
Monocytes Relative: 8 % (ref 3–12)
Neutro Abs: 4.4 10*3/uL (ref 1.7–7.7)
Neutrophils Relative %: 63 % (ref 43–77)

## 2010-05-01 LAB — COMPREHENSIVE METABOLIC PANEL
CO2: 22 mEq/L (ref 19–32)
Calcium: 8.9 mg/dL (ref 8.4–10.5)
Creatinine, Ser: 0.73 mg/dL (ref 0.4–1.2)
GFR calc non Af Amer: >60 mL/min (ref >60)
Glucose, Bld: 162 mg/dL — ABNORMAL HIGH (ref 70–99)
Total Protein: 6.5 g/dL (ref 6.0–8.3)

## 2010-05-01 LAB — CBC
HCT: 35.5 % — ABNORMAL LOW (ref 36.0–46.0)
Hemoglobin: 12 g/dL (ref 12.0–15.0)
MCH: 34.1 pg — ABNORMAL HIGH (ref 26.0–34.0)
MCHC: 34 g/dL (ref 30.0–36.0)
RDW: 13.9 % (ref 11.5–15.5)

## 2010-05-01 LAB — POCT I-STAT 3, ART BLOOD GAS (G3+)
Bicarbonate: 23.5 mEq/L (ref 20.0–24.0)
pCO2 arterial: 42.9 mmHg (ref 35.0–45.0)
pH, Arterial: 7.347 — ABNORMAL LOW (ref 7.350–7.400)
pO2, Arterial: 76 mmHg — ABNORMAL LOW (ref 80.0–100.0)

## 2010-05-01 LAB — PROTIME-INR: Prothrombin Time: 14.1 seconds (ref 11.6–15.2)

## 2010-05-01 LAB — BRAIN NATRIURETIC PEPTIDE: Pro B Natriuretic peptide (BNP): 148 pg/mL — ABNORMAL HIGH (ref 0.0–100.0)

## 2010-05-02 LAB — CARDIAC PANEL(CRET KIN+CKTOT+MB+TROPI)
CK, MB: 1.9 ng/mL (ref 0.3–4.0)
CK, MB: 2 ng/mL (ref 0.3–4.0)
Relative Index: 2 (ref 0.0–2.5)
Total CK: 85 U/L (ref 7–177)

## 2010-05-02 LAB — DIFFERENTIAL
Basophils Absolute: 0 10*3/uL (ref 0.0–0.1)
Eosinophils Relative: 0 % (ref 0–5)
Lymphocytes Relative: 18 % (ref 12–46)
Monocytes Absolute: 0.3 10*3/uL (ref 0.1–1.0)
Monocytes Relative: 7 % (ref 3–12)
Neutro Abs: 3.2 10*3/uL (ref 1.7–7.7)

## 2010-05-02 LAB — URINALYSIS, ROUTINE W REFLEX MICROSCOPIC
Bilirubin Urine: NEGATIVE
Glucose, UA: NEGATIVE mg/dL
Ketones, ur: NEGATIVE mg/dL
Specific Gravity, Urine: 1.011 (ref 1.005–1.030)
pH: 7 (ref 5.0–8.0)

## 2010-05-02 LAB — CBC
HCT: 33.6 % — ABNORMAL LOW (ref 36.0–46.0)
Hemoglobin: 11.7 g/dL — ABNORMAL LOW (ref 12.0–15.0)
MCHC: 34.7 g/dL (ref 30.0–36.0)
RBC: 3.48 MIL/uL — ABNORMAL LOW (ref 3.87–5.11)
RDW: 14.6 % (ref 11.5–15.5)

## 2010-05-02 LAB — TROPONIN I: Troponin I: 0.01 ng/mL (ref 0.00–0.06)

## 2010-05-02 LAB — GLUCOSE, CAPILLARY
Glucose-Capillary: 129 mg/dL — ABNORMAL HIGH (ref 70–99)
Glucose-Capillary: 161 mg/dL — ABNORMAL HIGH (ref 70–99)

## 2010-05-02 LAB — POCT I-STAT, CHEM 8
BUN: 16 mg/dL (ref 6–23)
Calcium, Ion: 1.11 mmol/L — ABNORMAL LOW (ref 1.12–1.32)
Chloride: 101 mEq/L (ref 96–112)
Creatinine, Ser: 0.9 mg/dL (ref 0.4–1.2)
Glucose, Bld: 126 mg/dL — ABNORMAL HIGH (ref 70–99)
TCO2: 27 mmol/L (ref 0–100)

## 2010-05-02 LAB — CK TOTAL AND CKMB (NOT AT ARMC)
CK, MB: 2.4 ng/mL (ref 0.3–4.0)
Relative Index: INVALID (ref 0.0–2.5)
Total CK: 98 U/L (ref 7–177)

## 2010-05-02 LAB — CORTISOL: Cortisol, Plasma: 15.4 ug/dL

## 2010-05-02 LAB — POCT CARDIAC MARKERS: Troponin i, poc: <0.05 ng/mL (ref 0.00–0.09)

## 2010-05-06 ENCOUNTER — Emergency Department (HOSPITAL_COMMUNITY)
Admission: EM | Admit: 2010-05-06 | Discharge: 2010-05-06 | Disposition: A | Discharge status: Home / Self Care | Payer: Medicare Other | Attending: Emergency Medicine | Admitting: Emergency Medicine

## 2010-05-06 DIAGNOSIS — E785 Hyperlipidemia, unspecified: Secondary | ICD-10-CM | POA: Insufficient documentation

## 2010-05-06 DIAGNOSIS — S0510XA Contusion of eyeball and orbital tissues, unspecified eye, initial encounter: Secondary | ICD-10-CM | POA: Insufficient documentation

## 2010-05-06 DIAGNOSIS — IMO0002 Reserved for concepts with insufficient information to code with codable children: Secondary | ICD-10-CM | POA: Insufficient documentation

## 2010-05-06 DIAGNOSIS — E119 Type 2 diabetes mellitus without complications: Secondary | ICD-10-CM | POA: Insufficient documentation

## 2010-05-06 DIAGNOSIS — H1130 Conjunctival hemorrhage, unspecified eye: Secondary | ICD-10-CM | POA: Insufficient documentation

## 2010-05-06 DIAGNOSIS — Z79899 Other long term (current) drug therapy: Secondary | ICD-10-CM | POA: Insufficient documentation

## 2010-05-06 DIAGNOSIS — S058X9A Other injuries of unspecified eye and orbit, initial encounter: Secondary | ICD-10-CM | POA: Insufficient documentation

## 2010-05-06 DIAGNOSIS — J45909 Unspecified asthma, uncomplicated: Secondary | ICD-10-CM | POA: Insufficient documentation

## 2010-05-06 LAB — GLUCOSE, CAPILLARY
Glucose-Capillary: 101 mg/dL — ABNORMAL HIGH (ref 70–99)
Glucose-Capillary: 82 mg/dL (ref 70–99)

## 2010-05-09 ENCOUNTER — Other Ambulatory Visit: Payer: Self-pay | Admitting: Internal Medicine

## 2010-05-09 LAB — DIFFERENTIAL
Basophils Absolute: 0 10*3/uL (ref 0.0–0.1)
Eosinophils Relative: 0 % (ref 0–5)
Lymphocytes Relative: 43 % (ref 12–46)
Lymphs Abs: 1.6 10*3/uL (ref 0.7–4.0)
Neutro Abs: 1.8 10*3/uL (ref 1.7–7.7)

## 2010-05-09 LAB — CBC
HCT: 36.6 % (ref 36.0–46.0)
Hemoglobin: 12.6 g/dL (ref 12.0–15.0)
Platelets: 185 10*3/uL (ref 150–400)
WBC: 3.8 10*3/uL — ABNORMAL LOW (ref 4.0–10.5)

## 2010-05-09 LAB — POCT I-STAT, CHEM 8
BUN: 16 mg/dL (ref 6–23)
Chloride: 98 mEq/L (ref 96–112)
Creatinine, Ser: 0.7 mg/dL (ref 0.4–1.2)
Sodium: 131 mEq/L — ABNORMAL LOW (ref 135–145)
TCO2: 29 mmol/L (ref 0–100)

## 2010-05-09 LAB — POCT CARDIAC MARKERS
CKMB, poc: 1.9 ng/mL (ref 1.0–8.0)
Myoglobin, poc: 88.2 ng/mL (ref 12–200)
Myoglobin, poc: 94.1 ng/mL (ref 12–200)
Troponin i, poc: <0.05 ng/mL (ref 0.00–0.09)

## 2010-05-11 ENCOUNTER — Other Ambulatory Visit: Payer: Self-pay | Admitting: *Deleted

## 2010-05-11 MED ORDER — TIOTROPIUM BROMIDE MONOHYDRATE 18 MCG IN CAPS: 18.0000 ug | ORAL_CAPSULE | Freq: Every day | RESPIRATORY_TRACT | Status: DC

## 2010-05-12 ENCOUNTER — Other Ambulatory Visit: Payer: Self-pay | Admitting: Internal Medicine

## 2010-05-19 LAB — POCT CARDIAC MARKERS: Myoglobin, poc: 35.2 ng/mL (ref 12–200)

## 2010-05-19 LAB — BASIC METABOLIC PANEL
BUN: 16 mg/dL (ref 6–23)
CO2: 25 mEq/L (ref 19–32)
Calcium: 8.6 mg/dL (ref 8.4–10.5)
Chloride: 97 mEq/L (ref 96–112)
Creatinine, Ser: 0.73 mg/dL (ref 0.4–1.2)
Glucose, Bld: 191 mg/dL — ABNORMAL HIGH (ref 70–99)
Potassium: 3.1 mEq/L — ABNORMAL LOW (ref 3.5–5.1)

## 2010-05-19 LAB — DIFFERENTIAL
Basophils Absolute: 0 10*3/uL (ref 0.0–0.1)
Basophils Relative: 0 % (ref 0–1)
Eosinophils Absolute: 0 10*3/uL (ref 0.0–0.7)
Eosinophils Relative: 0 % (ref 0–5)
Monocytes Relative: 4 % (ref 3–12)
Neutro Abs: 3.3 10*3/uL (ref 1.7–7.7)
Neutrophils Relative %: 75 % (ref 43–77)

## 2010-05-19 LAB — CBC
HCT: 36.1 % (ref 36.0–46.0)
Hemoglobin: 12.5 g/dL (ref 12.0–15.0)
MCHC: 34.6 g/dL (ref 30.0–36.0)
MCV: 95 fL (ref 78.0–100.0)
RBC: 3.8 MIL/uL — ABNORMAL LOW (ref 3.87–5.11)
RDW: 14.2 % (ref 11.5–15.5)

## 2010-05-23 LAB — BASIC METABOLIC PANEL
Calcium: 9 mg/dL (ref 8.4–10.5)
GFR calc Af Amer: >60 mL/min (ref >60)
GFR calc non Af Amer: >60 mL/min (ref >60)
Potassium: 4.2 mEq/L (ref 3.5–5.1)
Sodium: 131 mEq/L — ABNORMAL LOW (ref 135–145)

## 2010-05-23 LAB — POCT I-STAT, CHEM 8
BUN: 18 mg/dL (ref 6–23)
Creatinine, Ser: 0.9 mg/dL (ref 0.4–1.2)
Glucose, Bld: 100 mg/dL — ABNORMAL HIGH (ref 70–99)
Potassium: 4.2 mEq/L (ref 3.5–5.1)
Sodium: 130 mEq/L — ABNORMAL LOW (ref 135–145)
TCO2: 25 mmol/L (ref 0–100)

## 2010-05-23 LAB — POCT CARDIAC MARKERS
CKMB, poc: 1.6 ng/mL (ref 1.0–8.0)
CKMB, poc: 2.1 ng/mL (ref 1.0–8.0)
Myoglobin, poc: 61.3 ng/mL (ref 12–200)
Myoglobin, poc: 76.8 ng/mL (ref 12–200)
Troponin i, poc: <0.05 ng/mL (ref 0.00–0.09)

## 2010-05-24 ENCOUNTER — Telehealth: Payer: Self-pay | Admitting: Internal Medicine

## 2010-05-24 LAB — LIPID PANEL
Cholesterol: 180 mg/dL (ref 0–200)
HDL: 52 mg/dL (ref >39)
LDL Cholesterol: 118 mg/dL — ABNORMAL HIGH (ref 0–99)
Total CHOL/HDL Ratio: 3.5 RATIO
Triglycerides: 50 mg/dL (ref <150)

## 2010-05-24 LAB — BASIC METABOLIC PANEL
GFR calc Af Amer: >60 mL/min (ref >60)
GFR calc non Af Amer: >60 mL/min (ref >60)
Glucose, Bld: 92 mg/dL (ref 70–99)
Potassium: 3.7 mEq/L (ref 3.5–5.1)
Sodium: 130 mEq/L — ABNORMAL LOW (ref 135–145)

## 2010-05-24 LAB — URINALYSIS, MICROSCOPIC ONLY
Leukocytes, UA: NEGATIVE
Nitrite: NEGATIVE
Protein, ur: NEGATIVE mg/dL
Specific Gravity, Urine: 1.005 (ref 1.005–1.030)
Urobilinogen, UA: 0.2 mg/dL (ref 0.0–1.0)

## 2010-05-24 LAB — TROPONIN I
Troponin I: 0.01 ng/mL (ref 0.00–0.06)
Troponin I: 0.01 ng/mL (ref 0.00–0.06)
Troponin I: 0.01 ng/mL (ref 0.00–0.06)

## 2010-05-24 LAB — COMPREHENSIVE METABOLIC PANEL
ALT: 15 U/L (ref 0–35)
AST: 25 U/L (ref 0–37)
Albumin: 4.3 g/dL (ref 3.5–5.2)
Calcium: 9.8 mg/dL (ref 8.4–10.5)
GFR calc Af Amer: >60 mL/min (ref >60)
Potassium: 3.7 mEq/L (ref 3.5–5.1)
Sodium: 125 mEq/L — ABNORMAL LOW (ref 135–145)
Total Protein: 7.6 g/dL (ref 6.0–8.3)

## 2010-05-24 LAB — URINALYSIS, ROUTINE W REFLEX MICROSCOPIC
Protein, ur: NEGATIVE mg/dL
Urobilinogen, UA: 0.2 mg/dL (ref 0.0–1.0)

## 2010-05-24 LAB — DIFFERENTIAL
Lymphocytes Relative: 13 % (ref 12–46)
Lymphs Abs: 0.8 10*3/uL (ref 0.7–4.0)
Monocytes Relative: 6 % (ref 3–12)
Neutro Abs: 5.1 10*3/uL (ref 1.7–7.7)
Neutrophils Relative %: 78 % — ABNORMAL HIGH (ref 43–77)

## 2010-05-24 LAB — CBC
HCT: 31.3 % — ABNORMAL LOW (ref 36.0–46.0)
Hemoglobin: 11 g/dL — ABNORMAL LOW (ref 12.0–15.0)
MCHC: 34.8 g/dL (ref 30.0–36.0)
Platelets: 269 10*3/uL (ref 150–400)
RBC: 3.34 MIL/uL — ABNORMAL LOW (ref 3.87–5.11)
RDW: 13.2 % (ref 11.5–15.5)
RDW: 13.2 % (ref 11.5–15.5)

## 2010-05-24 LAB — POCT I-STAT, CHEM 8
Calcium, Ion: 1.01 mmol/L — ABNORMAL LOW (ref 1.12–1.32)
Creatinine, Ser: 1 mg/dL (ref 0.4–1.2)
Glucose, Bld: 111 mg/dL — ABNORMAL HIGH (ref 70–99)
HCT: 34 % — ABNORMAL LOW (ref 36.0–46.0)
Hemoglobin: 11.6 g/dL — ABNORMAL LOW (ref 12.0–15.0)

## 2010-05-24 LAB — CK TOTAL AND CKMB (NOT AT ARMC)
CK, MB: 2 ng/mL (ref 0.3–4.0)
Relative Index: INVALID (ref 0.0–2.5)

## 2010-05-24 LAB — POCT CARDIAC MARKERS
CKMB, poc: 1.3 ng/mL (ref 1.0–8.0)
Myoglobin, poc: 117 ng/mL (ref 12–200)
Troponin i, poc: <0.05 ng/mL (ref 0.00–0.09)

## 2010-05-24 LAB — GLUCOSE, CAPILLARY
Glucose-Capillary: 106 mg/dL — ABNORMAL HIGH (ref 70–99)
Glucose-Capillary: 110 mg/dL — ABNORMAL HIGH (ref 70–99)
Glucose-Capillary: 90 mg/dL (ref 70–99)

## 2010-05-24 LAB — URINE MICROSCOPIC-ADD ON

## 2010-05-24 LAB — HEMOGLOBIN A1C: Mean Plasma Glucose: 114 mg/dL

## 2010-05-24 LAB — SODIUM, URINE, RANDOM: Sodium, Ur: 24 mEq/L

## 2010-05-24 NOTE — Telephone Encounter (Signed)
Please let pt know that Placard is at front for pick up.

## 2010-05-24 NOTE — Telephone Encounter (Signed)
Pt aware placard is upfront for pick up

## 2010-05-24 NOTE — Telephone Encounter (Signed)
Pt is requesting a handicap placard. Pls advise.

## 2010-06-28 NOTE — H&P (Signed)
[name], [name]               ACCOUNT NO.:  [id number]   MEDICAL RECORD NO.:  [id number]          PATIENT TYPE:  OBV   LOCATION:  4707                         FACILITY:  [facility]   PHYSICIAN:  Lorain Childes, MD DATE OF BIRTH:  [birth year]   DATE OF ADMISSION:  05/04/2007  DATE OF DISCHARGE:                              HISTORY & PHYSICAL   CARDIOLOGIST:  [name]. Ladona Ridgel, M.D.   PRIMARY CARE PHYSICIAN:  Clinton D. Maple Hudson, MD, FCCP, FACP.   CHIEF COMPLAINT:  Chest pain with palpitations.   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  She did have recurrent syncope  over the past year with 2 episodes.  She has been evaluated by Dr.  Ladona Ridgel at the end of February of 2009, and she was placed on a monitor.  She now presents to the emergency room with complaints of chest pain and  palpitations.  The patient states that earlier this afternoon she had  some chest pain located in her central chest.  She states that it was  just a pain.  It was not pressure, tightness, or heaviness.  No  sharpness or stabbing sensation to her pain.  The pain lasted for couple  of minutes and resolved on its own.  She also has had palpitations which  have been coming off and on for the past few weeks and which prompted  the monitor placement.  She states that her palpitations last  approximately 5 minutes and resolve on their own.  She feels like her  heart rate is irregular at that time and sometimes going fast and  sometimes stable.  She was out shopping earlier this afternoon when this  began.  She stayed at the shop and waited for it to resolve, and it  passed and she came home.  Again, she had another episode of  palpitations while in her car, she sat in the car without driving, and  the palpitations resolved on their own.  With this, she had no  lightheadedness  or dizziness and no near syncope.  She continued with  her day.  Her son came over checked on her.  She had another episode  with chest pain, which lasted approximately 5 minutes.  Again, there  were no associated symptoms.  There was no tightness, no heaviness, no  pressure.  No nausea, vomiting, diaphoresis.  No shortness of breath.  There was no radiation of her pain;  it was located centrally in her  chest.  With this, her son prompted her to come in for evaluation.  To  relieve her pain, she took 2 aspirin which resolved these symptoms.  She  has not had any further symptoms.  She states that she has not had pain  like this previously.  She has been sending transmission in for her  palpitations through Card[employer].   PAST MEDICAL HISTORY:  1. 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  She is status post  radiofrequency ablation in 2001 by Dr. Ladona Ridgel.  2. Syncope.  She had syncope prior to AVNRT ablation, which prompted      multiple emergency room visits.  She had been free of syncope up      until this past year when she has had 2 syncopal events.  She was      evaluated by Dr. Ladona Ridgel and placed on a monitor.  He did recommend      her not driving until this was further evaluated.  She has been      using her CardioNet monitor.  With this syncopal event, her last      one occurred in her house approximately a month ago.  She had no      bowel or bladder dysfunction related to it and no seizure activity.      She had no prodrome leading up to her syncopal event.  3. Hypertension.  4. Reactive airway disease, on multiple inhalers and previously on      prednisone.  5. Dyslipidemia.  6. Diabetes.   SOCIAL HISTORY:  She lives locally.  She is a retired Interior and spatial designer.  She  denies any tobacco or alcohol use.   FAMILY HISTORY:  Her mother died of cancer.  Her father died from  poisoning.  She had 2 brothers who had heart disease.  One died from  leukemia also.   MEDICATIONS:  1. Actos  30 mg p.o. daily.  2. Aspirin 81 mg daily.  3. Crestor 10 mg p.o. daily.  4. Spiriva daily.  5. Singulair daily.  6. Claritin-D.  7. Diovan 320 mg p.o. daily.  8. PreserVision vitamins 1 tablet p.o. daily.  9. Albuterol p.r.n.  She use albuterol approximately 4 times in a      month.   ALLERGIES:  CONTRAST DYE which causes anaphylactic shock.  She also  states she had a reaction to EPINEPHRINE given at her dental office.   REVIEW OF SYSTEMS:  No fevers, no chills, no weight changes, no sweats.  No headaches or visual changes.  No skin rashes or lesions.  She states  the chest pain as described in the HPI.  No dyspnea on exertion,  orthopnea, or no PND.  No lower extremity edema.  She does have  palpitations as described above.  No syncopal events.  No coughing or  wheezing.  She denies any urinary symptoms.  No focal weakness.  Some  generalized malaise.  However, her activity has remained moderate.  No  nausea, vomiting, or diarrhea.  No bright red blood per rectum.  No  melena.  No hematemesis.  She states that she had a colonoscopy done  recently which was negative.  All other systems are negative.   PHYSICAL EXAMINATION:  Temperature 97.1, pulse 70, respirations 16,  blood pressure 123/71.  She is saturating at 100% on room air.  GENERAL:  She is a pleasant female, in no acute distress.  HEENT:  Normocephalic and atraumatic.  NECK:  JVP is 7cm normal  carotid upstroke.  There are no bruits.  CARDIOVASCULAR:  Normal S1 and , split S2.  Regular rate and rhythm.  She has a few ectopic beats noted.  Her pulses are equal throughout.  LUNGS:  Clear to auscultation without any wheezes or rhonchi.  ABDOMEN:  Soft and nontender with normoactive bowel sounds.  EXTREMITIES:  She has no edema.  NEUROLOGIC:  Nonfocal.    EKG:  Rate is 70, sinus rhythm.  She has PAC  noted.  Her axis is 54.  PR  is 183 milliseconds, QRS is 88 milliseconds, and QTc 423 milliseconds.  She has no Q-waves,  no acute ischemic changes, and there is no  hypertrophy.  There is no change from prior EKG dated September 14, 2002.    Labs show a white count of 4.4, hematocrit of 36, and platelets 198,000.  Potassium is 3.8, creatinine is 1.0, and glucose 108.  Point of care  enzymes show a CK-MB of 2.8, troponin less than 0.05, and myoglobin of  72.   ASSESSMENT AND PLAN:  1. The patient is a 77 year old female with history of syncope,      supraventricular tachycardia status post radiofrequency ablation,      April 04, 2007.  She has not had any recurrent syncope and is      evaluated by Dr. Ladona Ridgel and currently has a monitor in place.  She      comes in this evening for complaints of chest pain and      palpitations.  No wonder chest pain description with mostly      atypical features.  It may be related to her palpitations.  They      appeared to correlate with palpitation for the most part in her      description.  We will admit her in telemetry to monitor her for any      arrhythmias.  We will follow her EKG.  We check her cardiac      enzymes.  2. Palpitations.  __________  placing her on a monitor, they can check      with CardioNet to see if there any documented arrhythmias with her      transmissions.  Thus far, she has had premature atrial contractions      noted.  3. Hypertension.  Her blood pressure is controlled.  4. Reactive airway disease and chronic rhinitis.  We will continue her      Singulair, Spiriva, and Claritin.  5. Diabetes.  We will continue Actos, monitor her blood glucose and      cover with sliding scale as needed.      Lorain Childes, MD  Electronically Signed     CGF/MEDQ  D:  05/05/2007  T:  05/05/2007  Job:  [id number]

## 2010-06-28 NOTE — Assessment & Plan Note (Signed)
[facility]                             PULMONARY OFFICE NOTE   NAME:[name], [name]                      MRN:          [id number]  DATE:09/17/2006                            DOB:          [birth year]    PROBLEMS:  1. Chronic obstructive pulmonary disease/asthmatic bronchitis.  2. History of deep vein thrombosis.  3. Allergic rhinitis.  4. Diabetes.  5. History of supraventricular tachycardia with ablation.   HISTORY:  Her breathing has been fairly stable despite the heat. She  does cough and eats candy to suppress it, but she does not cough up  anything and does not notice wheezing, chest pain, or palpitation. She  feels a little unsteady on her feet at times, no specific pattern. I  cautioned her about sudden standing.   MEDICATIONS:  1. Aspirin 81 mg.  2. Actos.  3. Crestor.  4. Singulair.  5. Spiriva once daily.  6. Claritin D 12 hour daily p.r.n.  7. Albuterol rescue inhaler occasionally needed.   Drug intolerant CONTRAST DYE.   OBJECTIVE:  Weight 111 pounds, blood pressure 128/66, pulse 56, room air  saturation 99%. Pulse feels regular. I do not hear a murmur. She seems  alert, oriented, and appropriate now with no tremor. Lung fields are  quiet without rales, rhonchi, or wheeze. Breathing is unlabored. There  is no neck vein distension or strider. No peripheral edema or cyanosis.   IMPRESSION:  Asthma, rhinitis, cough which may reflect a combination of  both. I do not get a history suggesting reflux.   PLAN:  1. Chest x-ray.  2. Schedule return 6 months, earlier p.r.n.     Clinton D. Maple Hudson, MD, Tonny Bollman, FACP  Electronically Signed    CDY/MedQ  DD: 09/22/2006  DT: 09/24/2006  Job #: [id number]   cc:   Veverly Fells. Altheimer, M.D.  Gretta Cool, M.D.

## 2010-06-28 NOTE — H&P (Signed)
[name], [name]               ACCOUNT NO.:  [id number]   MEDICAL RECORD NO.:  [id number]          PATIENT TYPE:  INP   LOCATION:  1862                         FACILITY:  [facility]   PHYSICIAN:  Eduard Clos, MDDATE OF BIRTH:  1931/03/23   DATE OF ADMISSION:  07/05/2008  DATE OF DISCHARGE:                              HISTORY & PHYSICAL   PRIMARY CARE PHYSICIAN:  Unassigned.   PULMONOLOGIST:  Rennis Chris. Maple Hudson, MD, FCCP, FACP.   CARDIOLOGIST:  [facility].   CHIEF COMPLAINT:  Loss of function.   HISTORY OF PRESENT ILLNESS:  A 77 year old female with history of SVT ,  status post atrial fibrillation, history of diabetes mellitus type 1,  hypertension, who had a recent rotator cuff surgery 17 days ago.  Was  brought into the  ER.  The patient had an episode loss of consciousness.  She said she was  doing well.  She tried to go from bedroom to the kitchen and she  suddenly lost consciousness and it was witnessed by her son.  The  patient stated that she might have lost consciousness for less than a  minute.  She did not have any incontinence of urine, tongue biting or  any seizure-like activity.  The patient denies any headaches or any loss  of function of her limbs.   In the ER, the patient was found to be hypotensive and presently is  admitted for further work-up.   The patient denies any chest pain, shortness of breath, abdominal pain,  nausea, vomiting, diarrhea, fever, or chills.  Denies any dysuria or  discharged.  Marland Kitchen   PAST MEDICAL HISTORY:  1. Supraventricular tachycardia, status post ablation.  2. Hypertension.  3. Diabetes mellitus type 2.  4. Hyperlipidemia.   PAST SURGICAL HISTORY:  1. Low back surgery.  2. Knee surgery.  3. Tonsillectomy and adenoidectomy.  4. Recent rotator cuff surgery.   MEDICATIONS:  1. Actos 30 mg p.o. daily.  2. Crestor 10 mg p.o. on Tuesday and Thursday.  3. Diovan 325 mg daily.  4. HCTZ 12.5 mg daily.  5. Singular 10  mg daily.  6. Spiriva 18 mcg one inhalation daily.  7. Proair two puffs q.6h p.r.n.  8. Aspirin 81 mg daily.  9. Vitamin p.o. daily.  10.Oxycodone 5 mg p.o. q.6h. for pain which she took only 3 days ago.   FAMILY HISTORY:  Noncontributory.   SOCIAL HISTORY:  The patient denies smoking cigarettes, drinking alcohol  or using illicit drugs.   ALLERGIES:  IV DYE and EPINEPHRINE.   REVIEW OF SYSTEMS:  As per history of present illness,  nothing of  significance.   PHYSICAL EXAMINATION:  GENERAL:  The patient is examined at the bedside.  Not in acute distress.  VITAL SIGNS: Blood pressure 94/60, pulse 68, temperature 97, respiratory  rate 18, oxygen saturation 100% on room air.  HEENT:  Anicteric.  No pallor.  No facial asymmetry.  Tongue midline.  CHEST:  Bilateral air entry.  No rhonchi on palpation.  HEART:  S1 and S2 heard.  ABDOMEN:  Soft, nontender.  Bowel sounds heard.  CNS:  She is awake, alert and oriented to time, place and person.  Motor  strength 5/5.  EXTREMITIES:  Peripheral pulses full. No edema.   LABORATORY DATA:  The EKG shows normal sinus rhythm.  No acute ST-T wave  changes.  Chest x-ray shows stable chest.  CT scan of the head showed  pansinusitis.  No acute internal hemorrhage or edema.  X-ray of the  right shoulder shows degenerative changes. There is no fracture or  subluxation.  CBC showed WBC 6.5, hemoglobin 11.6, hematocrit 34,  platelets 269,000. Neutrophils 78%.  Basic metabolic panel:  Sodium 134,  potassium 4, chloride 91, CO2 29, glucose 111, BUN 22, creatinine 1,  total bilirubin 0.4, alk phos 57, AST 25, ALT 15, total protein 10.6,  albumin 4.3, calcium 9.8.  CK-MB 1.3, troponin-I less than 0.05.  Urinalysis:  Negative for ketones, nitrites negative, trace leukocytes,  wbc's 0.   ASSESSMENT:  1. Syncope with hypotension, probably to dehydration with medication      induced.  2. SVT S/P ablation.  3. Hyponatremia, probably from  hydrochlorothiazide.  4. Recent right rotator cuff surgery  5. Diabetes mellitus type 2.  6. Hyperlipidemia.   PLAN:  Will admit the patient to telemetry.  Place the patient on IV  fluids.  Check a urine sodium and urine electrolytes for further work-up  and hyponatremia.  Get a 2-D echo.  Will watch the patient on telemetry  for any arrhythmias.  Hold off on Diovan and HCTZ for now and further  recommendations as condition evolves.     Eduard Clos, MD  Electronically Signed    ANK/MEDQ  D:  07/05/2008  T:  07/06/2008  Job:  [id number]

## 2010-06-28 NOTE — Discharge Summary (Signed)
[name], [name]               ACCOUNT NO.:  [id number]   MEDICAL RECORD NO.:  [id number]          PATIENT TYPE:  INP   LOCATION:  5511                         FACILITY:  [facility]   PHYSICIAN:  Charlestine Massed, MDDATE OF BIRTH:  [birth year]   DATE OF ADMISSION:  07/05/2008  DATE OF DISCHARGE:  07/07/2008                               DISCHARGE SUMMARY   PRIMARY CARE PHYSICIAN:  [name] Fells. Altheimer, MD, phone number [phone].   PULMONOLOGIST:  [name] Chris. Maple Hudson, MD, [name]   CARDIOLOGIST:  Hillis Range, MD, [facility]   REASON FOR ADMISSION:  Syncope.   DISCHARGE DIAGNOSES:  1. Syncope secondary to hypovolemia secondary to hydrochlorothiazide.  2. History of hypertension, currently stable.  3. Diabetes mellitus type 2.  4. Dyslipidemia.  5. Sprain to right upper extremity.  6. Hyponatremia on admission, currently resolved.   DISCHARGE MEDICATIONS:  1. Actos 30 mg p.o. daily.  2. Crestor 10 mg p.o. on Tuesdays and Thursdays.  3. Diovan 160 mg p.o. daily.  This was changed from the previous      dosing of 320 mg p.o. daily.  4. Singulair 10 mg p.o. daily.  5. Spiriva 18 mcg one inhalation daily.  6. ProAir HFA 2 puffs q.6 hourly p.r.n.  7. Aspirin 81 mg p.o. daily.  8. Claritin 10 mg p.o. daily p.r.n. allergy.  9. Oxycodone 5 mg p.o. q.6 hourly p.r.n. pain.  10.PreserVision vitamin p.o. daily.   MEDICATION THAT WERE STOPPED:  Hydrochlorothiazide.   MEDICATIONS THAT WERE CHANGED:  Diovan decreased to 160 mg p.o. daily  from 320 mg p.o. daily.   CONSULTATIONS ORDERED DURING THIS ADMISSION:  Cardiology for  electrophysiology, [name], as EKG showed Wenckebach phenomenon  initially.   HOSPITAL COURSE:  1. Syncope due to hypertension.  The patient was markedly hypotensive      on admission.  She came after a fall and had syncope with the fall.      There was no head injury at the fall.  The patient's CAT scan      reviewed was negative for bleed.   X-ray of the shoulder did not      reveal any injury or fracture or rib fractures or humeral fracture      or dislocation of the humerus.  As early chronic degenerative      changes, the patient did not have any subluxation.  She was put in      a sling due to the sprain at time during fall and with pain      medications.   The patient was given IV fluid boluses and was started on IV normal  saline drip.  Hydrochlorothiazide was discontinued.  Yesterday, she was  markedly orthostatic and she was extremely dizzy when she woke up and  today after 24 hours of IV fluids, her orthostasis is currently normal.  On lying, her blood pressure is 141/64, heart rate of 63.  On sitting,  her blood pressure is 130/74, heart rate of 61 and on standing, her  blood pressure is 139/69 and heart rate of 64.  She is currently  ambulating in the hallway and in the room without any symptoms.  Telemetry reviewed and no arrhythmias noted.  Currently, she is having  normal sinus rhythm with some PACs.  The patient is being discharged  back home on Diovan which has been reduced to 160 mg p.o. daily.  1. Heart block on initial EKG.  Initially, EKG showed type 2 second-      degree heart block with possible Wenckebach.  Cardiology consult      was done.  After that, the telemetry review was reviewed by      Cardiology who stated that the patient currently is in normal sinus      rhythm with PACs.  No further cardiac evaluation is needed for      that.  In view of Cardiology recommendation for no further workup      for the heart block seen on the initial EKG, the patient is being      discharged currently, as the patient is also asymptomatic.  She was      at no time on any medications that would support AV node      conduction.  2. Dyslipidemia.  Currently, continue Crestor as dosed above.  3. Chronic lung disease.  She follows up with [name].      Continue the medications as mentioned above.  No acute  issues now.  4. Diabetes mellitus.  Continue Actos.  Blood sugars are currently      stable.  There was no need for any coverage while she was in the      hospital and her hemoglobin A1c was 5.6 which shows excellent      control of blood sugars and the patient may be having possibly some      impaired glucose tolerance rather than diabetes.  In my opinion,      continue just Actos.   DISPOSITION:  Discharged back home.  She lives with her husband.   FOLLOWUP:  Follow up with [name] in 1 week to recheck blood  pressure and adjustment of blood pressure medications as needed.  She is  off hydrochlorothiazide and on half dose of the Diovan as she was  before.  Currently, blood pressures are stable.   TESTS DONE DURING THIS ADMISSION:  1. Echocardiogram done on Jul 06, 2008 shows ejection fraction of 50-      50%.  The Doppler parameters are consistent with a grade 1      diastolic dysfunction.  No aortic stenosis.  No mitral stenosis.      Trivial mitral regurgitation and trivial tricuspid regurgitation      present.  Otherwise, valve structures were all normal.  Trivial      pericardial effusion identified.  2. CAT scan of the head without contrast which showed no acute      intracranial hemorrhage or hematoma or bleed or fractures seen.      Pansinusitis present.  3. Chest x-ray:  Diffuse pulmonary interstitial infiltrates.  It is      chronic.  Negative for edema or effusions.  Heart      structure normal, osteopenic with diffuse thoracic spine      degenerative changes.  4. X-ray shoulder, right:  No evidence of subluxation or fracture.      Degenerative changes noted.   TIME SPENT:  Total of 35 minutes spent on discharge.      Charlestine Massed, MD  Electronically Signed  UT/MEDQ  D:  07/07/2008  T:  07/08/2008  Job:  [id number]   cc:   [name], M.D.  [name]  Hillis Range, MD

## 2010-06-28 NOTE — Assessment & Plan Note (Signed)
[facility]                         ELECTROPHYSIOLOGY OFFICE NOTE   NAME:OVERLYRaelynne, Black                      MRN:          [id number]  DATE:04/11/2007                            DOB:          23-Jul-1931    [name] is referred today by Dr. Fannie Knee for evaluation of  recurrent syncope.   HISTORY OF PRESENT ILLNESS:  The patient is a very pleasant, 77 year old  woman who I actually met initially back in 2001 when she had recurrent  symptomatic SVT.  She underwent electrophysiologic study and catheter  ablation of AV node reentrant tachycardia without problem. Post  procedure, she did well except for rare palpitations. The patient notes  that last summer she had an episode where she passed out though it was  only for a very brief period of time and there was no consequence to it.  She recently describes another episode of frank syncope which occurred  without warning while she was in her house.  There is no loss of bowel  or bladder continence.  She did not initially seek medical attention.  She is now referred for additional evaluation.  The patient denies chest  pain.  She denies recurrent palpitations and she has otherwise felt  well.   Additional past medical history is notable for hypertension.  She has a  history of reactive airway disease and is on multiple inhalers.  In  times past, she had had to take prednisone.  She has a history of  dyslipidemia and is presently taking Crestor.   MEDICATIONS:  1. Actos 30 a day.  2. Aspirin 81 a day.  3. Crestor 10 a day.  4. Spiriva.  5. Singulair.  6. Claritin D.  7. Diovan 320 a day.   PHYSICAL EXAM:  She is a pleasant, 77 year old woman in no acute  distress.  Blood pressure today was 125/68, pulse was 72 and regular, respirations  18, and the weight was 111 pounds.  HEENT:  Normocephalic and atraumatic.  Pupils equal and round.  The  oropharynx was moist. The sclerae anicteric.  NECK:   Revealed no jugular distention. There was no thyromegaly.  The  trachea was midline. The carotids were 2+ and symmetric.  LUNGS:  Clear bilaterally to auscultation. No wheezes, rales or rhonchi  are present.  There is no increased work of breathing.  CARDIOVASCULAR:  Regular rate and rhythm with normal S1 and S2. There  are  no murmurs, rubs or gallops present.  There is no increased work of  breathing.  ABDOMEN:  Soft, nontender, nondistended. There was no organomegaly. The  bowel sounds were present. There was no rebound or guarding.  EXTREMITIES:  Demonstrated no cyanosis, clubbing or edema.  The pulses  were 2+ and symmetric.  NEUROLOGIC:  Alert and oriented x3. Cranial nerves intact.  The strength  was 5/5 and symmetric.  SKIN:  Exam was normal.   REVIEW OF SYSTEMS:  Negative except as noted in the HPI, otherwise the  only other positive review of systems is that he has generalized fatigue  and weakness at times.   IMPRESSION:  1. Two episodes of syncope separated by approximately 1 year.  2. History of SVT status post ablation.  3. Severe fatigue and malaise.   DISCUSSION:  The etiology of the patient's symptoms is unclear. I am not  quite clear why she had her syncopal episode.  I am however concerned  about bradycardia.  I have asked that she wear a CardioNet monitor to  see if we can document bradycardia.  She could certainly have recurrent  SVT though she does not feel palpitations like she did when she had  prior episodes of SVT back in 2001 and 2002. With this being said, I  will plan on seeing her back in the office in approximately 6-12 weeks.  Additional recommendations will be based on the results of her  additional testing.     [name]. Ladona Ridgel, MD  Electronically Signed    GWT/MedQ  DD: 04/11/2007  DT: 04/12/2007  Job #: [id number]   cc:   Joni Fears D. Maple Hudson, MD, FCCP, FACP

## 2010-06-28 NOTE — Consult Note (Signed)
NAMEKANIA, REGNIER               ACCOUNT NO.:  [id number]   MEDICAL RECORD NO.:  [id number]          PATIENT TYPE:  INP   LOCATION:  5511                         FACILITY:  [facility]   PHYSICIAN:  Hillis Range, MD       DATE OF BIRTH:  12-21-1931   DATE OF CONSULTATION:  DATE OF DISCHARGE:                                 CONSULTATION   REQUESTING PHYSICIAN:  Hospitalist.   REASON FOR CONSULTATION:  Syncope.   HISTORY OF PRESENT ILLNESS:  Ms. [name] is a pleasant 77 year old female  with a history of diabetes, hypertension and syncope who was admitted  after a syncopal episode.  The patient reports recently having shoulder  surgery.  On Sunday, she had stood and began ambulating within her house  when after several seconds she collapsed to the floor.  She reports  brief loss of consciousness for several seconds.  She was subsequently  brought to [facility] where she was confirmed to be  orthostatic.  She reports that over the past few months she has had  intermittent episodes of dizziness upon standing.  She reports  approximately 2-3 months ago that upon standing she had a similar  syncopal episode.  She denies any episodes of palpitations, chest  discomfort, shortness of breath or presyncope outside and recent changes  in position.  She is otherwise without complaint at this time.  She has  been observed on telemetry to have primarily sinus rhythm with frequent  premature atrial contractions.  There have been no significant  tachycardias, bradycardias, or pauses.  She is otherwise without  complaint at this time.   PAST MEDICAL HISTORY:  1. Diabetes mellitus.  2. Hypertension.  3. Preserved ejection fraction.  4. Diastolic dysfunction.  5. Recent shoulder surgery.  6. Chronic lung disease.   ALLERGIES:  IV DYE and CRESTOR.   CURRENT MEDICATIONS:  1. Actos 30 mg daily.  2. Diovan 320 mg daily.  3. Crestor 10 mg twice weekly.  4. Spiriva next.  5. Albuterol q.6 h.  p.r.n.  6. Aspirin 81 mg daily.  7. Claritin 10 mg p.r.n.  8. Oxycodone 5 mg q.6 h. p.r.n.   SOCIAL HISTORY:  The patient lives with her husband in [place].  She denies tobacco, alcohol or drug use.   FAMILY HISTORY:  Notable for cerebrovascular disease.   REVIEW OF SYSTEMS:  All systems reviewed and negative except as outlined  in the HPI above and as documented in my note.   PHYSICAL EXAMINATION:  VITAL SIGNS:  Blood pressure 89/53, heart rate  70, respirations 19, 96% on room air, afebrile.  The patient is  documented to be orthostatic.  GENERAL:  The patient is a thin elderly female in no acute distress.  She is alert and oriented x3.  HEENT:  Normocephalic, atraumatic.  Sclerae clear.  Conjunctivae pink.  Oropharynx clear.  NECK:  Supple.  No thyromegaly, JVD or bruits.  LUNGS:  Clear to auscultation bilaterally.  HEART:  Regular rate and rhythm.  No murmurs, rubs or gallops.  GI:  Soft, nontender, nondistended.  Positive bowel  sounds.  EXTREMITIES:  No clubbing, cyanosis or edema.  NEUROLOGIC:  Cranial nerves II-XII are intact.  Strength and sensation  are intact.  SKIN:  No ecchymoses or laceration.  MUSCULOSKELETAL:  Status post recent right rotator cuff surgery.  PSYCH:  Euthymic mood.  Full affect.   EKG reveals sinus rhythm with frequent premature atrial contractions and  an incomplete right bundle branch block, otherwise unremarkable.   Telemetry reveals predominantly sinus rhythm with no significant  bradycardias or pauses.  The patient's AV conduction appears to be  intact with frequent premature atrial contractions.   Head CT reveals no acute process.   Chest x-ray reveals chronic diffuse interstitial opacities.   LABORATORY DATA:  Creatinine 0.95, potassium 3.7, hematocrit 31.  TSH is  pending.   Transthoracic echocardiogram reveals an ejection fraction of 50-55% with  grade 1 diastolic dysfunction and no regional wall motion abnormalities.    IMPRESSION:  Ms. [name] is a pleasant 77 year old female with a history  of hypertension, diabetes and recent shoulder surgery who is now  admitted following a syncopal episode.  Her syncopal episode is clearly  orthostatic by history.  She reports ongoing orthostatic symptoms for  sometime.  She has been documented to be orthostatic upon admission.  I  think that at this point our strategy should be to target her  orthostasis and have volume replete the patient.  Her telemetry has  revealed no significant arrhythmias and I do not think that arrhythmia  is the cause of her present episode of syncope.  I would therefore not  proceed with any further cardiac evaluation or workup.  If the patient  continues to be orthostatic after adequate hydration, then support hose  and possibly midodrine may be beneficial.  I would refrain from  initiation of midodrine at this time, however.  The patient was recently  treated with hydrochlorothiazide and I would recommend that we  discontinue hydrochlorothiazide at this time.      Hillis Range, MD  Electronically Signed    JA/MEDQ  D:  07/06/2008  T:  07/07/2008  Job:  [id number]

## 2010-06-28 NOTE — Assessment & Plan Note (Signed)
[facility]                         ELECTROPHYSIOLOGY OFFICE NOTE   NAME:[name], [name]                      MRN:          [id number]  DATE:05/16/2007                            DOB:          10-13-1931    Ms. Ey returns today for follow-up.  She is a very pleasant 77-year-  old woman with a history of SVT status post catheter ablation back in  2001.  The patient was referred back to me by Dr. Fannie Knee for  evaluation of syncope and palpitations.  She also had severe fatigue,  malaise.  After this visit, I recommend she wear a CardioNet monitor and  that we obtain a stress Myoview study.  The CardioNet monitor  demonstrated occasional PACs and PVCs, stress test was unremarkable with  normal LV function and no evidence of ischemia.  She returns today for  follow-up and overall is improved.  She had no specific complaints  today, denying chest pain.  She does have rare palpitations.   PHYSICAL EXAMINATION:  GENERAL APPEARANCE:  She is a pleasant, well-  appearing woman in no acute distress.  VITAL SIGNS:  Blood pressure was 104/64.  The pulse was 88 and regular,  respirations were 18.  Weight was 112 pounds.  NECK:  No jugular distention.  LUNGS:  Clear bilaterally to auscultation.  No wheezes, rales or rhonchi  are present.  CARDIOVASCULAR:  Regular rate and rhythm with normal S1 and S2.  There  are no murmurs, rubs or gallops.  EXTREMITIES:  No edema.   IMPRESSION:  1. Remote episode of syncope.  2. History of supraventricular tachycardia status post ablation.  3. Palpitations secondary to PACs.   DISCUSSION:  Ms. Mone is stable.  There is no additional cardiac  workup recommended at the present time.  She will continue her current  medical therapy including aspirin 81 mg daily, Actos 30 a day, Crestor  10 a day, Singulair and Spiriva and Claritin and Diovan 320 a day.  We  will see her back in the office in one year.     [name].  Ladona Ridgel, MD  Electronically Signed    GWT/MedQ  DD: 05/16/2007  DT: 05/17/2007  Job #: [id number]

## 2010-06-28 NOTE — Discharge Summary (Signed)
NAMETANIJAH, MORAIS               ACCOUNT NO.:  [id number]   MEDICAL RECORD NO.:  [id number]          PATIENT TYPE:  OBV   LOCATION:  4707                         FACILITY:  [facility]   PHYSICIAN:  Madolyn Frieze. Jens Som, MD, FACCDATE OF BIRTH:  1931-08-15   DATE OF ADMISSION:  05/04/2007  DATE OF DISCHARGE:  05/05/2007                         DISCHARGE SUMMARY - REFERRING   DISCHARGE DIAGNOSES:  1. Chest discomfort of uncertain etiology.  2. Palpitations without evidence of dysrhythmia on telemetry here at      the hospital, however, it does show PA-Cs.  3. History as noted below.   PRIMARY CARE PHYSICIAN:  Clinton D. Maple Hudson, MD, FCCP, FACP.   PRIMARY CARDIOLOGIST:  [name]. Ladona Ridgel, MD.   SUMMARY OF HISTORY:  Ms. Cappello is a 77 year old white female who is  currently being evaluated by Dr. Ladona Ridgel with an event monitor for  palpitations.  However, on the evening of May 04, 2007, she presented  to the emergency room complaining of chest discomfort that started  around 4:30 p.m., improved after two aspirin and by 7 o'clock the  discomfort had resolved but she came to the emergency room for further  evaluation.  Dr. Bascom Levels saw the patient and admitted her for further  evaluation.   PAST MEDICAL HISTORY:  Notable for  1. SVT with ablation in 2001.  2. Syncope in February, for which a Cardio monitor was placed.  She      has been wearing this for the last two weeks,  3. Hypertension.  4. Dyslipidemia.  5. Diabetes.  6. History of DVT.   LABORATORY:  EKG showed normal sinus rhythm, PA-Cs, no acute changes.   Admission weight was 50.3 kg.  H&H was 11.8 and 34.1, normal indices,  platelets 198, WBCs 4.4.  D-dimer less than 0.22.  Sodium 132, potassium  3.6, BUN 15, creatinine 0.72, glucose 95.  CK, MB, relative indexes and  troponins did not reveal myocardial injury pattern.  BNP was 109.  Fasting lipids showed a total cholesterol 132, triglycerides 22, HDL 59,  LDL 69..   Chest  x-ray on May 04, 2007, showed no acute findings.   HOSPITAL COURSE:  The patient was admitted to 20.  Overnight, she  stated that she felt much better without any further chest discomfort.  She does have a chronic cough that she relates to asthma.  Telemetry did  not show any dysrhythmia except for occasional PACs.  Enzymes were  negative x2 and EKGs did not show any acute changes.  After review by  Dr. Jens Som, it felt that she could be discharged home with an  outpatient stress test and follow-up with Dr. Ladona Ridgel.   DIET:  She was asked to maintain a low-sodium, heart-healthy, ADA diet.   ACTIVITIES:  Not restricted.   MEDICATIONS:  1. Actos 30 mg daily.  2. Crestor 10 mg daily.  3. Diovan unknown dosage daily.  4. Singulair 10 mg daily.  5. Spiriva daily.  6. Aspirin 81 mg daily.  7. Vitamin as previously.  She was informed that her home medications have not changed.   She  was asked to place her monitor back on and to continue to activate  if she had any difficulties.  The office will call her with a follow-up  for a stress Myoview as well as an appointment with Dr. Ladona Ridgel.  She was  asked to bring all medications to all appointments.   DISCHARGE TIME:  25 minutes.      Joellyn Rued, PA-C      Madolyn Frieze Jens Som, MD, [facility]  Electronically Signed    EW/MEDQ  D:  05/05/2007  T:  05/05/2007  Job:  [id number]   cc:   Joni Fears D. Maple Hudson, MD, FCCP, Maurine Simmering. Ladona Ridgel, MD

## 2010-06-30 ENCOUNTER — Ambulatory Visit (INDEPENDENT_AMBULATORY_CARE_PROVIDER_SITE_OTHER): Payer: Medicare Other | Admitting: *Deleted

## 2010-06-30 DIAGNOSIS — R55 Syncope and collapse: Secondary | ICD-10-CM

## 2010-07-01 NOTE — Procedures (Signed)
[place]. [facility]  Patient:    [name], [name]                      MRN: [id number] Proc. Date: 07/25/98 Adm. Date:  [id number] Disc. Date: [id number] Attending:  [name] CC:         Kathrine Cords, R.N., [facility]                           Procedure Report  PROCEDURE PERFORMED:  Electrophysiologic study and radiofrequency catheter ablation.  INDICATIONS:  Recurrent supraventricular tachycardia.  I. INTRODUCTION:  The patient is a very pleasant 77 year old woman, who was referred for evaluation of tachy palpitations.  She states, that her symptoms have been present for several years, but have been worsening in the last several months.  She has had several visits to the emergency room requiring adenosine.  This was despite medical therapy.  She is now referred for catheter ablation.  II. PROCEDURE:  After informed consent was obtained, the patient was taken to the diagnostic electrophysiology laboratory in the fasting state.  After the usual preparation and draping, intravenous fentanyl and midazolam were given for sedation.  A 6-French hexapolar catheter was inserted percutaneously through the right jugular vein and advanced to the coronary sinus.  A 5-French quadripolar catheter was inserted percutaneously into the right femoral vein and advanced to the RV apex.  A 5-French quadripolar catheter was inserted percutaneously in the right femoral vein and advanced to the His bundle region.  After measurement of the basic intervals, rapid ventricular pacing was carried out from the RV apex at 490 msec and stepwise decreased down to 380 msec, where V-A Wenckebach was observed.  During rapid ventricular pacing, the atrial activation sequence was midline and decremental.  Next, programmed ventricular stimulation was carried out from the RV apex at a basic drive cycle length of 213 msec.  The S1-S2 interval was stepwise decreased down to 260 msec, where  ventricular refractoriness was observed.  During programmed ventricular stimulation, the atrial activation sequence was again midline and decremental.  Next, programmed atrial stimulation was carried out from the coronary sinus at a basic drive cycle length of 086 msec.  The S1-S2 interval was stepwise decreased down to 260 msec, where there was a marked A-H jump. Additional decrements resulted in an echo beat.  At a S1-S2 interval of 400/240, atrial refractoriness was observed.  Next, rapid atrial pacing was carried out from the coronary sinus at a pacing cycle length of 490 msec and stepwise decreased down to 330 msec, where A-V Wenckebach was observed. It should be noted that during rapid atrial pacing, the patient had nonsustained SVT.  Additional programmed atrial stimulation was carried out and this resulted in the initiation of sustained SVT.  This was characterized by a midline atrial activation with a VA interval of 55 msec.  PVCs placed during the tachycardia at the time of His bundle refractoriness demonstrated no evidence of atrial preexcitation.  In addition, ventricular pacing during tachycardia resulted in VAV conduction.  With the diagnosis of AVNRT confirmed, the ablation catheter was maneuvered into Kochs triangle.  A total of 10 RF energy applications with 4 minutes were delivered to Kochs triangle. During RF energy application, there was accelerated junctional rhythm. Following the ninth RF energy application, additional rapid atrial pacing was carried out and there was no inducible SVT.  However, the patients P-R interval remained greater  than the R-R interval and she did have a jump and an echo beat.  A 10th and final RF energy application was delivered and this resulted in a long episode of accelerated junctional rhythm.  Following this, the P-R interval was less than the R-R interval and again, there was no inducible SVT.  Isoproterenol was discontinued and the  patient was observed for a period of 15-20 minutes and there was no recurrent evidence of additional SVT and the P-R interval remained less than the R-R interval.  At this point, the catheters were removed, hemostasis was assured and the patient was returned to her room in good condition.  III. COMPLICATIONS:  None.  IV. RESULTS:  A. BASELINE 12 ECG:  The baseline 12 ECG demonstrates normal sinus rhythm, normal axis and intervals.  B. BASELINE INTERVALS:  The sinus node cycle length was 1040 msec, the P-R interval 202 msec, the QRS duration 92 msec, the A-H interval 111 msec, the hv interval 47 msec.  C. RAPID VENTRICULAR PACING:  Rapid ventricular pacing demonstrated a V-A Wenckebach cycle length of 380 msec and the atrial activation sequence was midline and decremental.  D. PROGRAMMED VENTRICULAR STIMULATION:  Programmed ventricular stimulation was carried out from the RV apex at a basic drive cycle length of 914 msec.  The S1-S2 interval was stepwise decreased down to 260 msec, where the ERP of the ventricle was observed.  During programmed ventricular stimulation, the atrial activation sequence was again midline and decremental.  E. RAPID ATRIAL PACING:  Rapid atrial pacing was carried out from the coronary sinus at a pacing cycle length of 490 msec and stepwise decreased down to 330 msec, where A-V Wenckebach was observed.  During rapid atrial pacing, the P-R interval was greater than the R-R interval.  Following RF energy application, the P-R interval became less than the R-R interval.  F. PROGRAMMED ATRIAL STIMULATION:  Programmed atrial stimulation was carried out from the coronary sinus at basic drive cycle lengths of 782 and 400 msec. The S1-S2 interval was stepwise decreased down to 260 msec, where the fast pathway ERP was observed.  Additional decrements down to 240 msec resulted in atrial refractoriness.  G. ARRHYTHMIAS OBSERVED: 1. AVNRT.  Initiation: Rapid  atrial pacing, or programmed atrial stimulation.    Duration: sustained at cycle length varying between 340 and 270 msec.  The     termination method was spontaneous or with ventricular pacing. 2. Atrial fibrillation.  Atrial fibrillation was present during rapid atrial    pacing on isoproterenol and this resulted in the requirement of    cardioversion at a 100 joules to restore sinus rhythm.  H. MAPPING:  Mapping of the patients Kochs triangle demonstrated the triangle to be displaced in a counterclockwise fashion to be somewhat decreased in size.  I. RF ENERGY APPLICATION:  A total of 10 RF energy applications for a total of 4 minutes and 25 seconds were delivered to sites 6 through 9 in Kochs triangle.  This resulted in prolonged periods of accelerated junctional rhythm.  Following this, the P-R interval remained less than the R-R interval and there was no inducible SVT.  There was evidence of residual slow pathway conduction with A-H jumps present.  V. RESULTS/CONCLUSION:  This demonstrates successful slow pathway modification in a patient with easily inducible A-V node reentry tachycardia rendering the tachycardia no longer inducible.  There were no immediate procedural complications. DD:  07/25/99 TD:  07/27/99 Job: [id number]

## 2010-07-01 NOTE — Op Note (Signed)
   NAME:  [name], [name]                         ACCOUNT NO.:  [id number]   MEDICAL RECORD NO.:  [id number]                   PATIENT TYPE:  AMB   LOCATION:  ENDO                                 FACILITY:  [facility]   PHYSICIAN:  Bernette Redbird, M.D.                DATE OF BIRTH:  [birth year]   DATE OF PROCEDURE:  09/19/2002  DATE OF DISCHARGE:                                 OPERATIVE REPORT   PROCEDURE:  Upper endoscopy with biopsies.   INDICATIONS FOR PROCEDURE:  A 77 year old female with coffeeground emesis  and persistent nausea, vomiting, and food intolerance with epigastric pain.   FINDINGS:  Mild bile reflux.  Small hiatal hernia with esophageal ring.   DESCRIPTION OF PROCEDURE:  The nature, purpose, and risks of the procedure  were familiar to the patient and she provided written consent.  Sedation was  fentanyl 30 mcg and Versed 4 mg IV without arrhythmias or desaturation.  Cautious sedation was used because of a history of pulmonary disease.   The Olympus video endoscope was passed under direct vision. The vocal cords  were very briefly seen and appeared grossly unremarkable.  There might have  been a little bit of erythema above the cords.   The esophagus was entered without too much difficulty under direct vision.   The esophageal mucosa was normal, without evidence of reflux esophagitis,  Barrett's esophagus, varices, infection, neoplasia.  There was widely patent  esophageal mucosa ring at the squamocolumnar junction and below this was a 2  to 3 cm hiatal hernia.   The stomach contained a small bilious residual and had some antral erythema  consistent with possible bile reflux gastritis, but no erosions, ulcers,  polyps, or masses were observed including a retroflexed view of the proximal  stomach. The pyloris was normal, without any scarring, stenosis, or  ulceration, and the duodenal bulb and second duodenum looked normal.   The scope was removed from the patient  after obtaining antral biopsies to  look for H.pylori infection. The patient tolerated the procedure well and  there were no apparent complications.   IMPRESSION:  1. Small hiatal hernia with esophageal ring.  2. Bile reflux with slight erythema of the antrum and the stomach.  3. No definite source for the patient's abdominal pain, nausea, or vomiting,     was endoscopically evident on this examination.   PLAN:  Await pathology on antral biopsies.                                               Bernette Redbird, M.D.   RB/MEDQ  D:  09/19/2002  T:  09/20/2002  Job:  [id number]
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
## 2010-07-01 NOTE — Op Note (Signed)
[place]  Patient:    [name], [name]                        MRN: [id number] Proc. Date: 09/04/00 Attending:  Maisie Fus B. Samuella Cota, M.D. CC:         Esmeralda Arthur, M.D.  Lonzo Cloud. Kriste Basque, M.D. [facility]   Operative Report  CCS [phone].  PREOPERATIVE DIAGNOSIS:  Mass, left breast.  POSTOPERATIVE DIAGNOSIS:  Mass, left breast.  PROCEDURE:  Excision of mass, left breast.  SURGEON:  Maisie Fus B. Samuella Cota, M.D.  ANESTHESIA:  1% Xylocaine local with anesthesia monitoring, anesthesiologist J. Claybon Jabs, M.D., and C.R.N.A.  DESCRIPTION OF PROCEDURE:  Patient was taken to the operating room and placed on the table in supine position.  The left breast was prepped and draped as a sterile field.  The patient had a palpable fullness at about the 5:30 position about 2 cm from the edge of the areola.  She also had a thickened, slightly transverse area at about the 7 oclock position.  A curved incision was outlined with the skin marker.  The area was then infiltrated with 1% Xylocaine local.  The incision was made and dissection taken through the skin and subcutaneous tissue.  There was no definite discrete mass, but there was some fullness of the breast tissue.  She had a considerable amount of fat in her breast tissue, and there were several very prominent fat lobules.  The area at the 5:30 position was removed, and bleeding was controlled with the cautery.  The palpable mass at the 7 oclock position was palpated from inside the wound, and this was a lipoma.  This lipoma was removed.  No discrete mass was felt in the wound.  The specimen removed, revealed no distinct mass, but I think a fair amount of what we were feeling was really prominent fatty tissue. After the bleeding had been controlled with the cautery, the wound was irrigated, closed with interrupted sutures of 3-0 Vicryl, and the skin was closed with a running subcuticular suture of 4-0 Vicryl.   Benzoin and half-inch Steri-Strips were used to reinforce the skin closure, a pressure dressing using 4 x 4s, ABD, and four-inch Hypafix was applied.  The patient seemed to tolerate the procedure well and was taken to the PACU in satisfactory condition. DD:  09/04/00 TD:  09/04/00 Job: [id number]

## 2010-07-01 NOTE — Assessment & Plan Note (Signed)
[facility]                             PULMONARY OFFICE NOTE   NAME:[name], [name]                      MRN:          [id number]  DATE:03/19/2006                            DOB:          13-Apr-1931    PROBLEM:  1. Chronic obstructive pulmonary disease/asthmatic bronchitis.  2. History of deep venous thrombosis.  3. Allergic rhinitis.  4. Diabetes mellitus.  5. History of supraventricular tachycardia with ablation.   HISTORY:  She feels well today. Has liked Spiriva and overall feels she  has been doing better in the past year or two. She is pleased that she  had a good examination recently with Dr. Leslie Dales. There is little or  no routine sputum and exercise tolerance has been stable without chest  pain.   MEDICATIONS:  1. Aspirin 81 mg.  2. Actos.  3. Crestor.  4. Singulair.  5. Spiriva.  6. P.r.n. use of Claritin D 12-hour.  7. Asmanex.  8. Albuterol rescue inhaler.   She uses the Asmanex in stretches when needed.   DRUG INTOLERANCES:  CONTRAST DYE.   OBJECTIVE:  Weight is 111 pounds.  Blood pressure is 132/72.  Pulse is  71.  Room air saturation is 99%.  CHEST: Is really pretty clear.  She looks well.  I do not find adenopathy or edema.  Heart sounds are regular without murmur.   Chest x-ray: Film at this office on November 15, 2005, showed some stable  elevation of the right hemidiaphragm, right hilar fullness, which is  unchanged going back a number of years now. Heart size normal. Some  degenerative change in the thoracic spine. Overall, stable chest.  Pulmonary function test had been done in 2004, showing obstructive  change in small airways with minimal production of diffusion capacity.   IMPRESSION:  1. Mild chronic asthma with very slight chronic obstructive pulmonary      disease.  2. Rhinitis.   PLAN:  1. No change is necessary. Walk for endurance.  2. Schedule return in six months, earlier p.r.n.     Clinton  D. Maple Hudson, MD, Tonny Bollman, FACP  Electronically Signed    CDY/MedQ  DD: 03/20/2006  DT: 03/20/2006  Job #: [id number]   cc:   Veverly Fells. Altheimer, M.D.

## 2010-07-01 NOTE — Assessment & Plan Note (Signed)
[name]                               PULMONARY OFFICE NOTE   NAME:[name], [name]                      MRN:          [id number]  DATE:11/15/2005                            DOB:          19-Jan-1932    PROBLEM:  1. Chronic obstructive pulmonary disease/asthmatic bronchitis.  2. History of deep venous thrombosis.  3. Allergic rhinitis.  4. Diabetes mellitus.  5. History of supraventricular tachycardia with ablation.   HISTORY:  She returns for followup reporting minor sniffling all day.  She  has not had sense of taste or smell for a long time.  She feels she eats  well but is losing weight, and Dr. Leslie Black is aware.  She has not been  able to tolerate Advair.  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  Frequent cough, usually  non-productive.  No chest pain, adenopathy, fever or sweats.   MEDICATIONS:  1. Aspirin 81 mg.  2. Actos.  3. Crestor.  4. Singular.  5. Asmanex.  6. Spiriva.  7. She has restarted Nasonex.  8. Has albuterol rescue inhaler.  9. Occasional Claritin D as alternative to Allegra D.  She makes a point that she does not want more medicine.   OBJECTIVE:  VITAL SIGNS:  Weight 108 pounds, is down from 114 pounds in  June.  BP 120/70, pulse regular 76.  Room air saturation 100%.  HEENT:  There is moderate nasal stuffiness and sniffing with no postnasal drainage.  Voice quality is normal.  I do not hear stridor.  NECK:  There is no neck vein distention.  I do not find adenopathy at the  neck, shoulders or axillae.  CHEST:  Breath sounds are quiet without cough or wheeze.  Breathing is  unlabored.  There is no dullness.  HEART:  Sounds are regular without murmur or gallop.  I can not feel liver  or spleen.  I did not examine breasts, but she denies change or problems.  No cyanosis or clubbing.  No peripheral edema.   CHEST X-RAY:  COPD with no obvious active  process.  Film is sent for over-  read.   IMPRESSION:  Chronic asthma with nearly normal pulmonary function tests last  year, rhinitis with anosmia, weight loss which may be related to her blood  sugar management but appropriately assessed by Dr. Leslie Black.   PLAN:  1. Chest x-ray, primarily with concern about her weight loss.  2. We discussed use of decongestant and anti-inflammatory nasal      medications.  Consider CT of sinuses  3. Schedule return in four months but earlier p.r.n.       [name] D. Maple Hudson, MD, FCCP, FACP      CDY/MedQ  DD:  11/19/2005  DT:  11/20/2005  Job #:  [id number]   cc:   [name], M.D.

## 2010-07-11 ENCOUNTER — Emergency Department (HOSPITAL_COMMUNITY): Payer: Medicare Other

## 2010-07-11 ENCOUNTER — Inpatient Hospital Stay (HOSPITAL_COMMUNITY)
Admission: EM | Admit: 2010-07-11 | Discharge: 2010-07-12 | DRG: 192 | Disposition: A | Discharge status: Home / Self Care | Payer: Medicare Other | Attending: Internal Medicine | Admitting: Internal Medicine

## 2010-07-11 DIAGNOSIS — J441 Chronic obstructive pulmonary disease with (acute) exacerbation: Principal | ICD-10-CM | POA: Diagnosis present

## 2010-07-11 DIAGNOSIS — I10 Essential (primary) hypertension: Secondary | ICD-10-CM | POA: Diagnosis present

## 2010-07-11 DIAGNOSIS — E7800 Pure hypercholesterolemia, unspecified: Secondary | ICD-10-CM | POA: Diagnosis present

## 2010-07-11 LAB — POCT I-STAT, CHEM 8
BUN: 29 mg/dL — ABNORMAL HIGH (ref 6–23)
Calcium, Ion: 1.17 mmol/L (ref 1.12–1.32)
Chloride: 97 mEq/L (ref 96–112)
HCT: 36 % (ref 36.0–46.0)
Potassium: 3.8 mEq/L (ref 3.5–5.1)
Sodium: 132 mEq/L — ABNORMAL LOW (ref 135–145)

## 2010-07-11 LAB — CK TOTAL AND CKMB (NOT AT ARMC)
CK, MB: 7.5 ng/mL (ref 0.3–4.0)
Total CK: 251 U/L — ABNORMAL HIGH (ref 7–177)

## 2010-07-11 LAB — TROPONIN I: Troponin I: <0.3 ng/mL (ref <0.30)

## 2010-07-12 LAB — CARDIAC PANEL(CRET KIN+CKTOT+MB+TROPI)
CK, MB: 5.7 ng/mL — ABNORMAL HIGH (ref 0.3–4.0)
Relative Index: 3.3 — ABNORMAL HIGH (ref 0.0–2.5)
Total CK: 171 U/L (ref 7–177)

## 2010-07-12 LAB — CK TOTAL AND CKMB (NOT AT ARMC): Total CK: 193 U/L — ABNORMAL HIGH (ref 7–177)

## 2010-07-12 LAB — TROPONIN I: Troponin I: <0.3 ng/mL (ref <0.30)

## 2010-07-13 ENCOUNTER — Telehealth: Payer: Self-pay | Admitting: Internal Medicine

## 2010-07-13 NOTE — Telephone Encounter (Signed)
Yes ok 
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
## 2010-07-13 NOTE — Telephone Encounter (Signed)
[id number] to advise pt of time and date. Carron Curie, CMA

## 2010-07-13 NOTE — Telephone Encounter (Signed)
Spoke with pt and she states she was in hospital from 07-11-10 to 07-12-10 due to COPD. She states they wanted her to stay longer but she had to get home to care for her husband. She states she was told to f/u with Dr. Maple Hudson asap. There is a 15 minute slot available tomorrow. Is it ok to use this time slot for pt HFU? Please advise. Carron Curie, CMA

## 2010-07-13 NOTE — Telephone Encounter (Signed)
Pt set to see CY tomorrow at 10am.  Carron Curie, CMA

## 2010-07-14 ENCOUNTER — Encounter: Payer: Self-pay | Admitting: Internal Medicine

## 2010-07-14 ENCOUNTER — Ambulatory Visit (INDEPENDENT_AMBULATORY_CARE_PROVIDER_SITE_OTHER): Payer: Medicare Other | Admitting: Internal Medicine

## 2010-07-14 VITALS — BP 124/72 | HR 52 | Ht 62.0 in | Wt 102.8 lb

## 2010-07-14 DIAGNOSIS — J449 Chronic obstructive pulmonary disease, unspecified: Secondary | ICD-10-CM

## 2010-07-14 NOTE — Assessment & Plan Note (Signed)
She had sustained second hand smoke exposure. Some of her lung disease is more like chronic obstructive asthma. i will check for a1AT.  We will have her finish the post hosp. prednisone taper.  I don't now what is in the "Advanced Aspirin" but she hasn't known of true aspirin allergy before.

## 2010-07-14 NOTE — Progress Notes (Signed)
  Subjective:    Patient ID: [name], female    DOB: 1931/12/30, 77 y.o.   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  Followed for COPD, complicated by chronic sinusitis, rhinitis. Last here April 12, 2010 after hosp for COPD exacerbation. Older sister was chain smoker who died of emphysema.  Now here for post hospital f/u after hosp 5/28-29/12 for exacerb asthma/ COPD. She hurried home early to care for debilitated husband. She blames this admission on trial of new "Advanced Aspirin" taken for joint pain. No prior prob with aspirin, but usually has used tylenol.  Today feels well, a little bit tight. Now tapering prednisone and no concerns with current meds.  Review of Systems Constitutional:   No weight loss, night sweats,  Fevers, chills, fatigue, lassitude. HEENT:   No headaches,  Difficulty swallowing,  Tooth/dental problems,  Sore throat,                No sneezing, itching, ear ache, nasal congestion, post nasal drip,   CV:  Minor chest wall/ sternal soreness,    No- orthopnea, PND, swelling in lower extremities, anasarca, dizziness, palpitations  GI  No heartburn, indigestion, abdominal pain, nausea, vomiting, diarrhea, change in bowel habits, loss of appetite  Resp:.  No excess mucus, no productive cough,  No non-productive cough,  No coughing up of blood.  No change in color of mucus.  No wheezing.   Skin: no rash or lesions.  GU: no dysuria, change in color of urine, no urgency or frequency.  No flank pain.  MS:  No joint pain or swelling.  No decreased range of motion.  No back pain.  Psych:  No change in mood or affect. No depression or anxiety.  No memory loss.      Objective:   Physical Exam General- Alert, Oriented, Affect-appropriate, Distress- none acute.  Petite  Skin- rash-none, lesions- none, excoriation- none  Lymphadenopathy- none  Head- atraumatic  Eyes- Gross vision intact, PERRLA,  conjunctivae clear secretions  Ears- Hearing, canals, Tm - normal  Nose- Clear, No- Septal dev, mucus, polyps, erosion, perforation   Throat- Mallampati II , mucosa clear , drainage- none, tonsils- atrophic  Neck- flexible , trachea midline, no stridor , thyroid nl, carotid no bruit  Chest - symmetrical excursion , unlabored     Heart/CV- Irregular- frequent dropped beats , no murmur , no gallop  , no rub, nl s1 s2                     - JVD- none , edema- none, stasis changes- none, varices- none     Lung- clear to P&A, wheeze- none, cough- none , dullness-none, rub- none     Chest wall- minor pressure tenderness at mid sternum Abd- tender-no, distended-no, bowel sounds-present, HSM- no  Br/ Gen/ Rectal- Not done, not indicated  Extrem- cyanosis- none, clubbing, none, atrophy- none, strength- nl  Neuro- grossly intact to observation         Assessment & Plan:

## 2010-07-14 NOTE — Patient Instructions (Signed)
Finish the prednisone taper now - then call if needed. I suggest you keep the pending appointment.

## 2010-07-25 NOTE — Discharge Summary (Signed)
[name], RISDEN               ACCOUNT NO.:  [id number]  MEDICAL RECORD NO.:  [id number]           PATIENT TYPE:  I  LOCATION:  2031                         FACILITY:  [facility]  PHYSICIAN:  Lonia Blood, M.D.       DATE OF BIRTH:  1931/07/18  DATE OF ADMISSION:  07/11/2010 DATE OF DISCHARGE:  07/12/2010                              DISCHARGE SUMMARY   PATIENT'S PRIMARY CARE PHYSICIAN:  This patient does not have a primary care physician.  She follows up with Dr. Casimiro Needle Altheimer from Endocrinology and Dr. Jetty Duhamel from Pulmonary as a co-ownership primary care physician co-op.  DISCHARGE DIAGNOSES: 1. Asthma/chronic obstructive pulmonary disease exacerbation, markedly     improved by the time of discharge. 2. Diabetes mellitus type 2. 3. Hypertension. 4. History of syncope. 5. Chronic sinusitis and allergic rhinitis. 6. Status post hysterectomy, tonsillectomy, appendectomy, and back     surgery.  DISCHARGE MEDICATIONS: 1. Actos 30 mg daily. 2. Flonase 2 sprays daily. 3. Loratadine 10 mg daily. 4. Losartan 50 mg daily. 5. Multivitamin 1 tablet daily. 6. Naproxen 220 mg daily as needed for pain. 7. Pravachol 40 mg daily. 8. Prednisone 60 mg for 2 days, 40 mg for 2 days, 20 mg for 2 days, 10     mg for 2 days, then to stop. 9. ProAir inhaled 1 puff twice a day as needed. 10.Singulair 10 mg daily. 11.Spiriva 80 mcg daily. 12.Tylenol Extra Strength 500 mg 1 tablet every 4 hours as needed for     pain. 13.Vitamin D 50,000 units weekly.  CONDITION ON DISCHARGE:  The patient was discharged in good condition. Oxygen saturation 96% on room air.  Hemodynamically stable.  Alert, oriented, in no  acute distress.  FOLLOWUP:  She will follow up with Dr. Jetty Duhamel as previously scheduled.  PROCEDURE DURING THIS ADMISSION:  The patient underwent chest x-ray portable on Jul 11, 2010, which showed COPD and clear lungs.  HISTORY AND PHYSICAL:  Refer to dictated H and P done  by Dr. Houston Siren.  HOSPITAL COURSE:  [name] is a 77 year old woman with known asthma/COPD who presented to the emergency room with complaints of severe dyspnea after mowing the lawn.  She was also complained of some chest pain.  She was placed on a telemetry unit, and she had serial cardiac enzyme measurements with three troponin Is less than 0.3.  Her EKG was checked twice without any ST elevation or ST depression noted. The official report of the EKG showed occasional supraventricular complexes, but no negative T-waves, no ST pressure, or ST elevation. The patient was treated for an asthma exacerbation with oral prednisone and albuterol nebulized, and by May 29. 2012, at 12:00 p.m., she felt better and she requested to be discharged home.  We have continued her regular medications and we added a prednisone taper.  We encouraged the patient to use allergen avoidance and to follow up closely with Dr. Maple Hudson and Dr. Leslie Dales.     Lonia Blood, M.D.     SL/MEDQ  D:  07/13/2010  T:  07/14/2010  Job:  [id number]  cc:  Veverly Fells. Altheimer, M.D. Clinton D. Maple Hudson, MD, [facility] FACP  Electronically Signed by Lonia Blood M.D. on 07/25/2010 09:36:54 AM

## 2010-08-10 ENCOUNTER — Encounter: Payer: Self-pay | Admitting: Internal Medicine

## 2010-08-11 ENCOUNTER — Ambulatory Visit (INDEPENDENT_AMBULATORY_CARE_PROVIDER_SITE_OTHER): Payer: Medicare Other | Admitting: Internal Medicine

## 2010-08-11 ENCOUNTER — Encounter: Payer: Self-pay | Admitting: Internal Medicine

## 2010-08-11 VITALS — BP 122/80 | HR 62 | Ht 62.0 in | Wt 108.4 lb

## 2010-08-11 DIAGNOSIS — J309 Allergic rhinitis, unspecified: Secondary | ICD-10-CM

## 2010-08-11 DIAGNOSIS — R079 Chest pain, unspecified: Secondary | ICD-10-CM

## 2010-08-11 DIAGNOSIS — J449 Chronic obstructive pulmonary disease, unspecified: Secondary | ICD-10-CM

## 2010-08-11 NOTE — Assessment & Plan Note (Signed)
Mild rhinitis now might benefit from sudafed, but she doesn't feel the need.

## 2010-08-11 NOTE — Patient Instructions (Signed)
Continue present treatment  Please call as needed 

## 2010-08-11 NOTE — Progress Notes (Signed)
Subjective:    Patient ID: [name], female    DOB: 12-13-1931, 77 y.o.   MRN: [id number]  HPI    Review of Systems     Objective:   Physical Exam        Assessment & Plan:   Subjective:    Patient ID: [name], female    DOB: 05/19/1931, 77 y.o.   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  Followed for COPD, complicated by chronic sinusitis, rhinitis. Last here April 12, 2010 after hosp for COPD exacerbation. Older sister was chain smoker who died of emphysema.  Now here for post hospital f/u after hosp 5/28-29/12 for exacerb asthma/ COPD. She hurried home early to care for debilitated husband. She blames this admission on trial of new "Advanced Aspirin" taken for joint pain. No prior prob with aspirin, but usually has used tylenol.  Today feels well, a little bit tight. Now tapering prednisone and no concerns with current meds.   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  Followed for COPD, complicated by chronic sinusitis, rhinitis. Breathing is fair- aware of some shortness, of breath and using her rescue inhaler 0-3/day. Stuffy nose- denies need for treatment.  Incidental shingles across left breast- Taking a pill twice daily. No fever. Denies chest infection. Husband- Hospice for prostate cancer.  Review of stems: Constitutional:   No weight loss, night sweats,  Fevers, chills, fatigue, lassitude. HEENT:   No headaches,  Difficulty swallowing,  Tooth/dental problems,  Sore throat,                No sneezing, itching, ear ache, nasal congestion, post nasal drip,   CV:   chest wall/ sternal soreness/ Shingles pain,    No- orthopnea, PND, swelling in lower extremities, anasarca, dizziness, palpitations  GI  No heartburn, indigestion, abdominal pain, nausea, vomiting, diarrhea, change in bowel habits, loss of appetite  Resp:.  No excess mucus, no productive  cough,  No non-productive cough,  No coughing up of blood.  No change in color of mucus.  No wheezing.   Skin: per HPI  GU: no dysuria, change in color of urine, no urgency or frequency.  No flank pain.  MS:  No joint pain or swelling.  No decreased range of motion.  No back pain.  Psych:  No change in mood or affect. No depression or anxiety.  No memory loss.      Objective:   Physical Exam General- Alert, Oriented, Affect-appropriate, Distress- none acute.  Petite  Skin- few small pink spots left upper chest- no vesicles seen  Lymphadenopathy- none  Head- atraumatic  Eyes- Gross vision intact, PERRLA, conjunctivae clear secretions  Ears- Hearing, canals, Tm - normal  Nose- Stuffy, No- Septal dev, mucus, polyps, erosion, perforation   Throat- Mallampati II , mucosa clear , drainage- none, tonsils- atrophic  Neck- flexible , trachea midline, no stridor , thyroid nl, carotid no bruit  Chest - symmetrical excursion , unlabored     Heart/CV- Irregular- frequent dropped beats , no murmur , no gallop  , no rub, nl s1 s2                     - JVD- none , edema- none, stasis changes- none, varices- none     Lung- clear to P&A, wheeze- none, cough- none , dullness-none, rub- none     Chest wall-  Abd- tender-no, distended-no, bowel sounds-present, HSM- no  Br/ Gen/ Rectal- Not done, not indicated  Extrem- cyanosis- none, clubbing, none, atrophy- none, strength- nl  Neuro- grossly intact to observation         Assessment & Plan:

## 2010-08-11 NOTE — Assessment & Plan Note (Signed)
Fairly good control with some appropriate flexible use of her rescue inhaler. Chest wall discomfort from Shingles pain is adding to her sense of breathing effort.

## 2010-08-14 NOTE — Assessment & Plan Note (Signed)
Chest wall pain from shingles.

## 2010-08-15 ENCOUNTER — Encounter: Payer: Self-pay | Admitting: Internal Medicine

## 2010-09-27 ENCOUNTER — Ambulatory Visit (INDEPENDENT_AMBULATORY_CARE_PROVIDER_SITE_OTHER): Payer: Medicare Other | Admitting: Internal Medicine

## 2010-09-27 ENCOUNTER — Encounter: Payer: Self-pay | Admitting: Internal Medicine

## 2010-09-27 DIAGNOSIS — J329 Chronic sinusitis, unspecified: Secondary | ICD-10-CM

## 2010-09-27 DIAGNOSIS — R55 Syncope and collapse: Secondary | ICD-10-CM

## 2010-09-27 LAB — PACEMAKER DEVICE OBSERVATION: DEVICE MODEL PM: 2341959

## 2010-09-27 NOTE — Assessment & Plan Note (Signed)
She has had no recurrent episodes. Her loop recorder demonstrates no bradycardia or tachycardia arrhythmias. We'll continue watchful waiting.

## 2010-09-27 NOTE — Patient Instructions (Signed)
Your physician recommends that you schedule a follow-up appointment in: 3 months with the device clinic and 12 months with Dr Taylor  

## 2010-09-27 NOTE — Assessment & Plan Note (Signed)
I have encouraged her to try regular Claritin.
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
## 2010-10-03 ENCOUNTER — Other Ambulatory Visit: Payer: Self-pay | Admitting: Internal Medicine

## 2010-10-19 ENCOUNTER — Emergency Department (HOSPITAL_COMMUNITY): Payer: Medicare Other

## 2010-10-19 ENCOUNTER — Emergency Department (HOSPITAL_COMMUNITY)
Admission: EM | Admit: 2010-10-19 | Discharge: 2010-10-19 | Disposition: A | Discharge status: Home / Self Care | Payer: Medicare Other | Attending: Emergency Medicine | Admitting: Emergency Medicine

## 2010-10-19 DIAGNOSIS — R0609 Other forms of dyspnea: Secondary | ICD-10-CM | POA: Insufficient documentation

## 2010-10-19 DIAGNOSIS — R05 Cough: Secondary | ICD-10-CM | POA: Insufficient documentation

## 2010-10-19 DIAGNOSIS — R0989 Other specified symptoms and signs involving the circulatory and respiratory systems: Secondary | ICD-10-CM | POA: Insufficient documentation

## 2010-10-19 DIAGNOSIS — I10 Essential (primary) hypertension: Secondary | ICD-10-CM | POA: Insufficient documentation

## 2010-10-19 DIAGNOSIS — R059 Cough, unspecified: Secondary | ICD-10-CM | POA: Insufficient documentation

## 2010-10-19 DIAGNOSIS — E119 Type 2 diabetes mellitus without complications: Secondary | ICD-10-CM | POA: Insufficient documentation

## 2010-10-19 DIAGNOSIS — J45901 Unspecified asthma with (acute) exacerbation: Secondary | ICD-10-CM | POA: Insufficient documentation

## 2010-10-19 DIAGNOSIS — E785 Hyperlipidemia, unspecified: Secondary | ICD-10-CM | POA: Insufficient documentation

## 2010-10-19 LAB — POCT I-STAT, CHEM 8
BUN: 29 mg/dL — ABNORMAL HIGH (ref 6–23)
Calcium, Ion: 1.17 mmol/L (ref 1.12–1.32)
Chloride: 99 mEq/L (ref 96–112)
Glucose, Bld: 118 mg/dL — ABNORMAL HIGH (ref 70–99)
HCT: 36 % (ref 36.0–46.0)
TCO2: 29 mmol/L (ref 0–100)

## 2010-10-19 LAB — CBC
MCH: 33.2 pg (ref 26.0–34.0)
MCV: 98.8 fL (ref 78.0–100.0)
Platelets: 170 10*3/uL (ref 150–400)
RDW: 14.2 % (ref 11.5–15.5)

## 2010-10-19 LAB — DIFFERENTIAL
Basophils Relative: 1 % (ref 0–1)
Eosinophils Absolute: 0 10*3/uL (ref 0.0–0.7)
Eosinophils Relative: 0 % (ref 0–5)
Lymphs Abs: 1.8 10*3/uL (ref 0.7–4.0)
Monocytes Relative: 10 % (ref 3–12)

## 2010-10-25 ENCOUNTER — Other Ambulatory Visit: Payer: Self-pay | Admitting: Orthopedic Surgery

## 2010-10-25 DIAGNOSIS — M1990 Unspecified osteoarthritis, unspecified site: Secondary | ICD-10-CM

## 2010-10-31 ENCOUNTER — Ambulatory Visit
Admission: RE | Admit: 2010-10-31 | Discharge: 2010-10-31 | Disposition: A | Discharge status: Home / Self Care | Payer: Medicare Other | Source: Ambulatory Visit | Attending: Orthopedic Surgery | Admitting: Orthopedic Surgery

## 2010-10-31 DIAGNOSIS — M1990 Unspecified osteoarthritis, unspecified site: Secondary | ICD-10-CM

## 2010-10-31 MED ORDER — METHYLPREDNISOLONE ACETATE 40 MG/ML INJ SUSP (RADIOLOG
120.0000 mg | Freq: Once | INTRAMUSCULAR | Status: AC
  Administered 2010-10-31: 120 mg via EPIDURAL

## 2010-11-02 ENCOUNTER — Other Ambulatory Visit: Payer: Self-pay | Admitting: Internal Medicine

## 2010-11-07 LAB — BASIC METABOLIC PANEL
CO2: 28
Chloride: 97
GFR calc non Af Amer: >60
Glucose, Bld: 95
Potassium: 3.6
Sodium: 132 — ABNORMAL LOW

## 2010-11-07 LAB — LIPID PANEL
LDL Cholesterol: 69
Total CHOL/HDL Ratio: 2.2
VLDL: 4

## 2010-11-07 LAB — POCT I-STAT CREATININE
Creatinine, Ser: 1
Operator id: 151321

## 2010-11-07 LAB — POCT CARDIAC MARKERS
CKMB, poc: 2.9
Myoglobin, poc: 72.9
Operator id: 151321
Troponin i, poc: <0.05

## 2010-11-07 LAB — I-STAT 8, (EC8 V) (CONVERTED LAB)
Acid-Base Excess: 1
Bicarbonate: 27.9 — ABNORMAL HIGH
Glucose, Bld: 108 — ABNORMAL HIGH
Hemoglobin: 12.2
Operator id: 151321
Sodium: 131 — ABNORMAL LOW
TCO2: 29

## 2010-11-07 LAB — CARDIAC PANEL(CRET KIN+CKTOT+MB+TROPI): CK, MB: 4

## 2010-11-07 LAB — DIFFERENTIAL
Basophils Absolute: 0
Lymphocytes Relative: 36
Neutro Abs: 1.9
Neutrophils Relative %: 44

## 2010-11-07 LAB — CBC
HCT: 31.2 — ABNORMAL LOW
Hemoglobin: 10.7 — ABNORMAL LOW
Platelets: 198
RDW: 13.3
RDW: 13.6
WBC: 4.4

## 2010-11-07 LAB — CK TOTAL AND CKMB (NOT AT ARMC): Total CK: 142

## 2010-12-12 ENCOUNTER — Telehealth: Payer: Self-pay | Admitting: Internal Medicine

## 2010-12-12 ENCOUNTER — Ambulatory Visit (INDEPENDENT_AMBULATORY_CARE_PROVIDER_SITE_OTHER): Payer: Medicare Other | Admitting: Internal Medicine

## 2010-12-12 ENCOUNTER — Encounter: Payer: Self-pay | Admitting: Internal Medicine

## 2010-12-12 VITALS — BP 118/68 | HR 79 | Ht 62.0 in | Wt 103.0 lb

## 2010-12-12 DIAGNOSIS — J4489 Other specified chronic obstructive pulmonary disease: Secondary | ICD-10-CM

## 2010-12-12 DIAGNOSIS — J309 Allergic rhinitis, unspecified: Secondary | ICD-10-CM

## 2010-12-12 DIAGNOSIS — J449 Chronic obstructive pulmonary disease, unspecified: Secondary | ICD-10-CM

## 2010-12-12 MED ORDER — PHENYLEPHRINE HCL 1 % NA SOLN
3.0000 [drp] | Freq: Once | NASAL | Status: AC
  Administered 2010-12-12: 3 [drp] via NASAL

## 2010-12-12 MED ORDER — DOXYCYCLINE HYCLATE 100 MG PO TABS: ORAL_TABLET | ORAL | Status: DC

## 2010-12-12 MED ORDER — COMPRESSOR/NEBULIZER MISC: 1.0000 | Freq: Once | Status: DC

## 2010-12-12 MED ORDER — ALBUTEROL SULFATE (2.5 MG/3ML) 0.083% IN NEBU: 2.5000 mg | INHALATION_SOLUTION | Freq: Four times a day (QID) | RESPIRATORY_TRACT | Status: DC | PRN

## 2010-12-12 MED ORDER — PROMETHAZINE-CODEINE 6.25-10 MG/5ML PO SYRP: 5.0000 mL | ORAL_SOLUTION | ORAL | Status: DC | PRN

## 2010-12-12 MED ORDER — PREDNISONE 10 MG PO TABS: ORAL_TABLET | ORAL | Status: DC

## 2010-12-12 NOTE — Assessment & Plan Note (Signed)
Recurrent exacerbations of COPD. Some may be viral or allergic. Plan home nebulizer with albuterol. Hopefully this will cut down visits to the emergency room. Prednisone 8 day taper. Doxycycline.

## 2010-12-12 NOTE — Progress Notes (Signed)
Patient ID: [name], female    DOB: July 19, 1931, 77 y.o.   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  Followed for COPD, complicated by chronic sinusitis, rhinitis. Last here April 12, 2010 after hosp for COPD exacerbation. Older sister was chain smoker who died of emphysema.  Now here for post hospital f/u after hosp 5/28-29/12 for exacerb asthma/ COPD. She hurried home early to care for debilitated husband. She blames this admission on trial of new "Advanced Aspirin" taken for joint pain. No prior prob with aspirin, but usually has used tylenol.  Today feels well, a little bit tight. Now tapering prednisone and no concerns with current meds.   08/11/10- Breathing is fair- aware of some shortness, of breath and using her rescue inhaler 0-3/day. Stuffy nose- denies need for treatment.  Incidental shingles across left breast- Taking a pill twice daily. No fever. Denies chest infection. Husband- Hospice for prostate cancer.  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  Followed for COPD, complicated by chronic sinusitis, rhinitis. Husband died and she admits being depressed, having difficulty coping. Hasn't felt well for 10 days with increased cough. Sputum is stained orange by cough syrup but otherwise not purulent. Denies fever chest pain, blood or swollen glands. Using rescue inhaler frequently. Went to the emergency room one month ago for shortness of breath.   Review of stems: See HPI Constitutional:   No-   weight loss, night sweats, fevers, chills, fatigue, lassitude. HEENT:   No-  headaches, difficulty swallowing, tooth/dental problems, sore throat,       No-  sneezing, itching, ear ache,  nasal congestion, post nasal drip,  CV:  No-   chest pain, orthopnea, PND, swelling in lower extremities, anasarca, dizziness, palpitations Resp: +  shortness of breath with exertion or at  rest.              +  productive cough,  No non-productive cough,  No- coughing up of blood.              No- purulent  change in color of mucus.  No- wheezing.   Skin: No-   rash or lesions. GI:  No-   heartburn, indigestion, abdominal pain, nausea, vomiting, diarrhea,                 change in bowel habits, loss of appetite GU: No-   dysuria, change in color of urine, no urgency or frequency.  No- flank pain. MS:  No-   joint pain or swelling.  No- decreased range of motion.  No- back pain. Neuro-     nothing unusual Psych:  Depressed since husband died.  No memory loss.       Objective:  General- Alert, Oriented, Affect-appropriate, Distress- none acute- + malaise Skin- rash-none, lesions- none, excoriation- none Lymphadenopathy- none Head- atraumatic            Eyes- Gross vision intact, PERRLA, conjunctivae clear secretions            Ears- Hearing, canals-normal            Nose- Thick occlusive white mucus,  no-Septal dev,polyps, erosion, perforation             Throat- Mallampati II , mucosa clear , drainage- none, tonsils- atrophic Neck- flexible , trachea midline, no stridor , thyroid nl, carotid no bruit Chest - symmetrical excursion , unlabored  Heart/CV- RRR , no murmur , no gallop  , no rub, nl s1 s2                           - JVD- none , edema- none, stasis changes- none, varices- none           Lung- Distant with few faint rhonchi, mild cough ,                 dullness-none, rub- none           Chest wall-  Abd- tender-no, distended-no, bowel sounds-present, HSM- no Br/ Gen/ Rectal- Not done, not indicated Extrem- cyanosis- none, clubbing, none, atrophy- none, strength- nl Neuro- grossly intact to observation

## 2010-12-12 NOTE — Assessment & Plan Note (Signed)
Rhinitis or rhinosinusitis. Current exacerbation. Plan-nasal decongestant aerosol. Consider nasal steroid therapy after the current episode is clear.

## 2010-12-12 NOTE — Telephone Encounter (Signed)
Called and spoke with pt. Pt aware of CY's response.  Pt verbalized understanding and denied any questions.

## 2010-12-12 NOTE — Patient Instructions (Addendum)
Neb neo nasal  Script prednisone and doxycycline sent  Order- DME- nebulizer/ albuterol   Up to 4 times daily as needed for COPD   Scripts have been printed- for DME company  Script for cough syrup refill

## 2010-12-12 NOTE — Telephone Encounter (Signed)
She would only use the nebulizer if she needs something while at home. She will still want to have the rescue inhaler available for when she is out and around.    Ok to refill her rescue inhaler as needed

## 2010-12-12 NOTE — Telephone Encounter (Signed)
I spoke with pt and she states she received her albuterol nebulizer medicationtoday. Pt states she forgot to ask Dr. Maple Hudson if she still needed to use her albuterol inhaler as well. Pt states Dr. Maple Hudson did not mention anything about this and wants to know if she still needs to have this on hand. Please advise Dr. Maple Hudson, thanks  Carver Fila, CMA

## 2010-12-16 ENCOUNTER — Telehealth: Payer: Self-pay | Admitting: Internal Medicine

## 2010-12-16 ENCOUNTER — Emergency Department (HOSPITAL_COMMUNITY)
Admission: EM | Admit: 2010-12-16 | Discharge: 2010-12-16 | Disposition: A | Discharge status: Home / Self Care | Payer: Medicare Other | Attending: Emergency Medicine | Admitting: Emergency Medicine

## 2010-12-16 ENCOUNTER — Emergency Department (HOSPITAL_COMMUNITY): Payer: Medicare Other

## 2010-12-16 DIAGNOSIS — J449 Chronic obstructive pulmonary disease, unspecified: Secondary | ICD-10-CM | POA: Insufficient documentation

## 2010-12-16 DIAGNOSIS — E785 Hyperlipidemia, unspecified: Secondary | ICD-10-CM | POA: Insufficient documentation

## 2010-12-16 DIAGNOSIS — J4489 Other specified chronic obstructive pulmonary disease: Secondary | ICD-10-CM | POA: Insufficient documentation

## 2010-12-16 DIAGNOSIS — R5381 Other malaise: Secondary | ICD-10-CM | POA: Insufficient documentation

## 2010-12-16 DIAGNOSIS — E119 Type 2 diabetes mellitus without complications: Secondary | ICD-10-CM | POA: Insufficient documentation

## 2010-12-16 DIAGNOSIS — R51 Headache: Secondary | ICD-10-CM | POA: Insufficient documentation

## 2010-12-16 DIAGNOSIS — R072 Precordial pain: Secondary | ICD-10-CM | POA: Insufficient documentation

## 2010-12-16 DIAGNOSIS — R42 Dizziness and giddiness: Secondary | ICD-10-CM | POA: Insufficient documentation

## 2010-12-16 DIAGNOSIS — I10 Essential (primary) hypertension: Secondary | ICD-10-CM | POA: Insufficient documentation

## 2010-12-16 LAB — COMPREHENSIVE METABOLIC PANEL
BUN: 20 mg/dL (ref 6–23)
CO2: 24 mEq/L (ref 19–32)
Calcium: 9.4 mg/dL (ref 8.4–10.5)
Creatinine, Ser: 0.62 mg/dL (ref 0.50–1.10)
GFR calc Af Amer: >90 mL/min (ref >90)
GFR calc non Af Amer: 85 mL/min — ABNORMAL LOW (ref >90)
Glucose, Bld: 163 mg/dL — ABNORMAL HIGH (ref 70–99)

## 2010-12-16 LAB — CBC
HCT: 34.7 % — ABNORMAL LOW (ref 36.0–46.0)
MCHC: 33.4 g/dL (ref 30.0–36.0)
MCV: 98.6 fL (ref 78.0–100.0)
RDW: 13.6 % (ref 11.5–15.5)

## 2010-12-16 LAB — DIFFERENTIAL
Basophils Absolute: 0 10*3/uL (ref 0.0–0.1)
Eosinophils Relative: 0 % (ref 0–5)
Lymphocytes Relative: 10 % — ABNORMAL LOW (ref 12–46)
Lymphs Abs: 0.7 10*3/uL (ref 0.7–4.0)
Monocytes Absolute: 0.2 10*3/uL (ref 0.1–1.0)

## 2010-12-16 NOTE — Telephone Encounter (Signed)
Pt having chest pain an her heads hurt

## 2010-12-16 NOTE — Telephone Encounter (Signed)
I spoke with pt and she states she already has prednisone that was given to her on Monday and she started this Tuesday with her abx doxycycline. Pt states she is doing saline nasal spray and Singulair daily. Pt states she see's Dr. Ladona Ridgel and will call him about her chest pains. Pt states the SOB is coming from the stuffy nose. I advised pt that since this is so that the nebulizer tx is not going to help her then. Pt states she has always had stuffy nose and has not been able to get rid of this. Pt is requesting further recs. Please advise Dr. Maple Hudson, thanks  Allergies  Allergen Reactions  . Aspirin Other (See Comments)    Advanced Aspirin brand  . Epinephrine   . Ivp Dye (Iodinated Diagnostic Agents)      Carver Fila, CMA

## 2010-12-16 NOTE — Telephone Encounter (Signed)
If steady use of nasal saline rinse and nasal steroid spray don't help her nose, then I suggest we get her an appointment with the ENT doctors at [facility]- like Dr Pollyann Kennedy or as available.

## 2010-12-16 NOTE — Telephone Encounter (Signed)
Spoke with patient-states she talked with her cardiologist and they requested she go straight to the ER-patient was on her way to ER now.

## 2010-12-16 NOTE — Telephone Encounter (Signed)
11/2--pt calling stating she has had an episode of chest and severe head pain which lasted only seconds and went from chest to head--describes head pain as "a squeezing pain" in her head --no diaphoresis,nausea--she does have a very bad head cold--she states she called dr young(her pulmonologist) and they told her to call here--i advised pt to go to nearest ED, but pt refuses to do this --pt states that if it happens again she will go to [place]--I STATED I WOULD PASS MESSAGE ALONG TO DR Ladona Ridgel AND HIS NURSE KELLY--PT AGREES--NT

## 2010-12-16 NOTE — Telephone Encounter (Signed)
Per CY-NOTE TO US:(much stress and depression-husband died) Does she have a PCP or cardiologist to assess the chest pain? If she is SOB because of stuffy nose, using her nebulizer wont help. Offer Prednisone 10 mg #14 take 1 daily x 2 weeks for rhinitis no refills.

## 2010-12-16 NOTE — Telephone Encounter (Signed)
I spoke with pt and she states she was experiencing chest pains last night and this morning. Pt states she is no longer having these pains. Pt c/o stuffy nose and states she is having to use her nebulizer every 4 hours. Pt states now her throat is burning after she uses her nebs. Pt denies any SOB, wheezing, chest tightness, cough, chest pains, fever, nausea, vomiting, numbness in her arms, blurred vision. Pt is requesting recs from Dr. Maple Hudson. Please advise, thanks  Allergies  Allergen Reactions  . Aspirin Other (See Comments)    Advanced Aspirin brand  . Epinephrine   . Ivp Dye (Iodinated Diagnostic Agents)      Carver Fila, CMA

## 2010-12-18 ENCOUNTER — Telehealth: Payer: Self-pay | Admitting: Nurse Practitioner

## 2010-12-18 NOTE — Telephone Encounter (Signed)
pts son called stating that pt has had recurrent headache and intermittent chest pain today.  i rec that she present to local ED.  Son asked if he could take her to urgent care and i advised that if they feel that she is not emergently ill, then urgent care may be appropriate but given recurrent Ss, she should be seen by someone today.  Son verbalized understanding.

## 2010-12-19 ENCOUNTER — Other Ambulatory Visit: Payer: Self-pay | Admitting: Internal Medicine

## 2010-12-19 ENCOUNTER — Telehealth: Payer: Self-pay | Admitting: Internal Medicine

## 2010-12-19 NOTE — Telephone Encounter (Signed)
Patient states stop using the nebulizer prescribed by the pulmonologist because it made her sick.  Patient  has an appointment with Jacolyn Reedy PA on 12/22/10 at 8:25 Am she  will keep the  appointment.

## 2010-12-19 NOTE — Telephone Encounter (Signed)
Pt had some chest pain that went to her head and went to ED Friday night and she is very weak and son is very concerned

## 2010-12-19 NOTE — Telephone Encounter (Signed)
Patient states the headache and chest pain is better, she went to the ED on Saturday november 3 rd for those symptoms and was told  that and every thing was fine. Now she  C/O of weakness that she almost fell today x 2 and SOB, she has been using the nebulizer,  What is bother most is she feels very weak.

## 2010-12-21 ENCOUNTER — Encounter (HOSPITAL_COMMUNITY): Payer: Self-pay

## 2010-12-21 ENCOUNTER — Emergency Department (HOSPITAL_COMMUNITY)
Admission: EM | Admit: 2010-12-21 | Discharge: 2010-12-22 | Disposition: A | Discharge status: Home / Self Care | Payer: Medicare Other | Attending: Emergency Medicine | Admitting: Emergency Medicine

## 2010-12-21 ENCOUNTER — Emergency Department (HOSPITAL_COMMUNITY): Payer: Medicare Other

## 2010-12-21 ENCOUNTER — Telehealth: Payer: Self-pay | Admitting: Internal Medicine

## 2010-12-21 DIAGNOSIS — R059 Cough, unspecified: Secondary | ICD-10-CM | POA: Insufficient documentation

## 2010-12-21 DIAGNOSIS — Z86718 Personal history of other venous thrombosis and embolism: Secondary | ICD-10-CM | POA: Insufficient documentation

## 2010-12-21 DIAGNOSIS — J45901 Unspecified asthma with (acute) exacerbation: Secondary | ICD-10-CM

## 2010-12-21 DIAGNOSIS — R05 Cough: Secondary | ICD-10-CM | POA: Insufficient documentation

## 2010-12-21 DIAGNOSIS — R64 Cachexia: Secondary | ICD-10-CM | POA: Insufficient documentation

## 2010-12-21 DIAGNOSIS — E119 Type 2 diabetes mellitus without complications: Secondary | ICD-10-CM | POA: Insufficient documentation

## 2010-12-21 DIAGNOSIS — J441 Chronic obstructive pulmonary disease with (acute) exacerbation: Secondary | ICD-10-CM | POA: Insufficient documentation

## 2010-12-21 DIAGNOSIS — Z79899 Other long term (current) drug therapy: Secondary | ICD-10-CM | POA: Insufficient documentation

## 2010-12-21 DIAGNOSIS — Z9889 Other specified postprocedural states: Secondary | ICD-10-CM | POA: Insufficient documentation

## 2010-12-21 DIAGNOSIS — R0602 Shortness of breath: Secondary | ICD-10-CM | POA: Insufficient documentation

## 2010-12-21 LAB — BASIC METABOLIC PANEL
Chloride: 93 mEq/L — ABNORMAL LOW (ref 96–112)
GFR calc Af Amer: >90 mL/min (ref >90)
Potassium: 4.4 mEq/L (ref 3.5–5.1)

## 2010-12-21 LAB — CBC
HCT: 36.5 % (ref 36.0–46.0)
Hemoglobin: 12 g/dL (ref 12.0–15.0)
RDW: 13.4 % (ref 11.5–15.5)
WBC: 5.7 10*3/uL (ref 4.0–10.5)

## 2010-12-21 MED ORDER — PREDNISONE 20 MG PO TABS
40.0000 mg | ORAL_TABLET | Freq: Once | ORAL | Status: AC
  Administered 2010-12-21: 40 mg via ORAL
  Filled 2010-12-21: qty 2

## 2010-12-21 MED ORDER — AEROCHAMBER PLUS W/MASK MISC
Status: AC
  Administered 2010-12-21: 21:00:00
  Filled 2010-12-21: qty 1

## 2010-12-21 MED ORDER — ALBUTEROL SULFATE HFA 108 (90 BASE) MCG/ACT IN AERS: 2.0000 | INHALATION_SPRAY | Freq: Once | RESPIRATORY_TRACT | Status: DC

## 2010-12-21 MED ORDER — ALBUTEROL SULFATE HFA 108 (90 BASE) MCG/ACT IN AERS
2.0000 | INHALATION_SPRAY | RESPIRATORY_TRACT | Status: DC | PRN
  Administered 2010-12-21 (×2): 2 via RESPIRATORY_TRACT
  Filled 2010-12-21: qty 6.7

## 2010-12-21 NOTE — ED Notes (Signed)
Pt here for sob sts she can hardly breath, speaks in complete sentences, nad noted in triage, sts started several days ago but today is worse. Oxygen level 95 %.

## 2010-12-21 NOTE — ED Provider Notes (Signed)
7:44 PM  Patient discussed with Dr. Ethelda Chick. The patient will be admitted to the CDU on the asthma protocol. Of note the patient did not like nebulizer treatments and will only use albuterol to treat her shortness of breath from COPD. Dr.Yang is the patient's pulmonologist.  10:00 PM Patient has been monitored for the last 2 and half hours. Her original peak flow coming in was 80 and her second peak flow was has increased to 1:15. The patient's O2 sats are above 90 on room air. She states that her chest does not feel tight and that she feels as if she may be breathing a little bit better. Patient is able to ambulate. We will continue to follow patient.  11:58 PM  Patient plan discussed with Dr. Alto Denver who will resume patient care. She will reevaluate patient's lung sounds at 6 AM. As long as the patient is not currently wheezing and is able to stay saturated above 93% on room air after ambulation she is able to be discharged. Of note her son should be called to arrange transportation.  [place], [place] 12/21/10 [phone]

## 2010-12-21 NOTE — Telephone Encounter (Signed)
[name] spoke to pt & her son, advised CY & advised pt & her son to go to their local  ED or Urgent Care.  Antionette Fairy

## 2010-12-21 NOTE — ED Notes (Signed)
Patient is resting comfortably. 

## 2010-12-21 NOTE — ED Notes (Signed)
Peak flow 115

## 2010-12-21 NOTE — ED Notes (Signed)
Vital signs stable. 

## 2010-12-21 NOTE — ED Notes (Signed)
sts worse after nebulizer.
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
## 2010-12-22 ENCOUNTER — Ambulatory Visit (INDEPENDENT_AMBULATORY_CARE_PROVIDER_SITE_OTHER): Payer: Medicare Other | Admitting: Physician Assistant

## 2010-12-22 ENCOUNTER — Encounter: Payer: Self-pay | Admitting: Physician Assistant

## 2010-12-22 ENCOUNTER — Encounter: Payer: Self-pay | Admitting: Internal Medicine

## 2010-12-22 ENCOUNTER — Ambulatory Visit (INDEPENDENT_AMBULATORY_CARE_PROVIDER_SITE_OTHER): Payer: Medicare Other | Admitting: *Deleted

## 2010-12-22 DIAGNOSIS — R55 Syncope and collapse: Secondary | ICD-10-CM

## 2010-12-22 DIAGNOSIS — R0989 Other specified symptoms and signs involving the circulatory and respiratory systems: Secondary | ICD-10-CM

## 2010-12-22 MED ORDER — DIGOXIN 125 MCG PO TABS: ORAL_TABLET | ORAL | Status: DC

## 2010-12-22 MED ORDER — SODIUM CHLORIDE 0.9 % IV BOLUS (SEPSIS)
500.0000 mL | Freq: Once | INTRAVENOUS | Status: AC
  Administered 2010-12-22: 500 mL via INTRAVENOUS

## 2010-12-22 MED ORDER — PREDNISONE 20 MG PO TABS: 40.0000 mg | ORAL_TABLET | Freq: Once | ORAL | Status: DC

## 2010-12-22 NOTE — Progress Notes (Signed)
ILR interrogation 

## 2010-12-22 NOTE — Assessment & Plan Note (Signed)
Patient complains of chest pain and  palpitations for over a week now. Loop recorder check verifies many fast rates as fast as 320-344 ms. This dates from November 1 through November 6. It was faster when she was on the nebulizer's which he has stopped. She is currently being treated for acute asthma so we cannot increase her beta blocker. We have added Digoxin to her treatment.
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
## 2010-12-22 NOTE — Assessment & Plan Note (Signed)
Patient has not had recurrent syncope, but is dizzy with her tachycardia. We will add Digoxin in hopes of controlling her heart rate.

## 2010-12-22 NOTE — ED Provider Notes (Signed)
Patient ambulate in with oxygen saturation going down only as far is 91%. Patient safe for discharge home.  Cyndra Numbers, MD 12/22/10 [phone]

## 2010-12-22 NOTE — Patient Instructions (Signed)
Your physician recommends that you schedule a follow-up appointment in: 3 weeks with Dr Ladona Ridgel Your physician has recommended you make the following change in your medication: START Digoxin 125 mcg Take 2 tablets today and 2 tomorrow then start 1 tablet daily on Friday

## 2010-12-22 NOTE — ED Provider Notes (Signed)
Medical screening examination/treatment/procedure(s) were conducted as a shared visit with non-physician practitioner(s) and myself.  I personally evaluated the patient during the encounter  Doug Sou, MD 12/22/10 [phone]

## 2010-12-22 NOTE — Assessment & Plan Note (Signed)
Patient has COPD with Asthma and exacerbation, just discharged from the hospital. Hopefully once she takes her steroids, this will improve.

## 2010-12-23 ENCOUNTER — Emergency Department (HOSPITAL_COMMUNITY): Payer: Medicare Other

## 2010-12-23 ENCOUNTER — Other Ambulatory Visit: Payer: Self-pay

## 2010-12-23 ENCOUNTER — Encounter (HOSPITAL_COMMUNITY): Payer: Self-pay | Admitting: *Deleted

## 2010-12-23 ENCOUNTER — Inpatient Hospital Stay (HOSPITAL_COMMUNITY)
Admission: EM | Admit: 2010-12-23 | Discharge: 2010-12-27 | DRG: 309 | Disposition: A | Discharge status: Home / Self Care | Payer: Medicare Other | Attending: Internal Medicine | Admitting: Internal Medicine

## 2010-12-23 ENCOUNTER — Telehealth: Payer: Self-pay | Admitting: Internal Medicine

## 2010-12-23 DIAGNOSIS — J4489 Other specified chronic obstructive pulmonary disease: Secondary | ICD-10-CM | POA: Diagnosis present

## 2010-12-23 DIAGNOSIS — M792 Neuralgia and neuritis, unspecified: Secondary | ICD-10-CM | POA: Diagnosis present

## 2010-12-23 DIAGNOSIS — I471 Supraventricular tachycardia, unspecified: Secondary | ICD-10-CM

## 2010-12-23 DIAGNOSIS — R0789 Other chest pain: Secondary | ICD-10-CM | POA: Diagnosis present

## 2010-12-23 DIAGNOSIS — I10 Essential (primary) hypertension: Secondary | ICD-10-CM | POA: Diagnosis present

## 2010-12-23 DIAGNOSIS — I498 Other specified cardiac arrhythmias: Principal | ICD-10-CM | POA: Diagnosis present

## 2010-12-23 DIAGNOSIS — J449 Chronic obstructive pulmonary disease, unspecified: Secondary | ICD-10-CM | POA: Diagnosis present

## 2010-12-23 DIAGNOSIS — I519 Heart disease, unspecified: Secondary | ICD-10-CM

## 2010-12-23 DIAGNOSIS — R51 Headache: Secondary | ICD-10-CM | POA: Diagnosis present

## 2010-12-23 DIAGNOSIS — Z86718 Personal history of other venous thrombosis and embolism: Secondary | ICD-10-CM

## 2010-12-23 DIAGNOSIS — R5381 Other malaise: Secondary | ICD-10-CM | POA: Diagnosis present

## 2010-12-23 DIAGNOSIS — IMO0002 Reserved for concepts with insufficient information to code with codable children: Secondary | ICD-10-CM | POA: Diagnosis present

## 2010-12-23 DIAGNOSIS — R079 Chest pain, unspecified: Secondary | ICD-10-CM

## 2010-12-23 DIAGNOSIS — E119 Type 2 diabetes mellitus without complications: Secondary | ICD-10-CM | POA: Diagnosis present

## 2010-12-23 DIAGNOSIS — J9610 Chronic respiratory failure, unspecified whether with hypoxia or hypercapnia: Secondary | ICD-10-CM

## 2010-12-23 HISTORY — DX: Syncope and collapse: R55

## 2010-12-23 HISTORY — DX: Heart disease, unspecified: I51.9

## 2010-12-23 LAB — CBC
Hemoglobin: 12.1 g/dL (ref 12.0–15.0)
MCH: 32.5 pg (ref 26.0–34.0)
MCV: 97.3 fL (ref 78.0–100.0)
RBC: 3.72 MIL/uL — ABNORMAL LOW (ref 3.87–5.11)
WBC: 7 10*3/uL (ref 4.0–10.5)

## 2010-12-23 LAB — BASIC METABOLIC PANEL
Calcium: 9 mg/dL (ref 8.4–10.5)
Creatinine, Ser: 0.61 mg/dL (ref 0.50–1.10)
GFR calc Af Amer: >90 mL/min (ref >90)
Potassium: 4.3 mEq/L (ref 3.5–5.1)

## 2010-12-23 LAB — POCT I-STAT TROPONIN I: Troponin i, poc: 0.01 ng/mL (ref 0.00–0.08)

## 2010-12-23 LAB — DIFFERENTIAL
Eosinophils Absolute: 0 10*3/uL (ref 0.0–0.7)
Eosinophils Relative: 0 % (ref 0–5)
Lymphocytes Relative: 10 % — ABNORMAL LOW (ref 12–46)
Lymphs Abs: 0.7 10*3/uL (ref 0.7–4.0)
Monocytes Relative: 6 % (ref 3–12)
Neutrophils Relative %: 84 % — ABNORMAL HIGH (ref 43–77)

## 2010-12-23 MED ORDER — NITROGLYCERIN 0.4 MG SL SUBL: 0.4000 mg | SUBLINGUAL_TABLET | SUBLINGUAL | Status: DC | PRN

## 2010-12-23 MED ORDER — ONDANSETRON HCL 4 MG/2ML IJ SOLN
INTRAMUSCULAR | Status: AC
  Filled 2010-12-23: qty 2

## 2010-12-23 MED ORDER — SODIUM CHLORIDE 0.9 % IV SOLN: 250.0000 mL | INTRAVENOUS | Status: DC

## 2010-12-23 MED ORDER — ONDANSETRON HCL 4 MG/2ML IJ SOLN
4.0000 mg | Freq: Once | INTRAMUSCULAR | Status: AC
  Administered 2010-12-23: 4 mg via INTRAVENOUS

## 2010-12-23 MED ORDER — ONDANSETRON HCL 4 MG/2ML IJ SOLN
4.0000 mg | Freq: Four times a day (QID) | INTRAMUSCULAR | Status: DC | PRN
  Administered 2010-12-24: 4 mg via INTRAVENOUS
  Filled 2010-12-23: qty 2

## 2010-12-23 MED ORDER — ASPIRIN 81 MG PO CHEW
324.0000 mg | CHEWABLE_TABLET | ORAL | Status: AC
  Filled 2010-12-23: qty 4

## 2010-12-23 MED ORDER — ZOLPIDEM TARTRATE 5 MG PO TABS: 5.0000 mg | ORAL_TABLET | Freq: Every evening | ORAL | Status: DC | PRN

## 2010-12-23 MED ORDER — VITAMIN D3 1.25 MG (50000 UT) PO CAPS: 50000.0000 [IU] | ORAL_CAPSULE | ORAL | Status: DC

## 2010-12-23 MED ORDER — SODIUM CHLORIDE 0.9 % IJ SOLN
3.0000 mL | Freq: Two times a day (BID) | INTRAMUSCULAR | Status: DC
  Administered 2010-12-24 – 2010-12-27 (×8): 3 mL via INTRAVENOUS

## 2010-12-23 MED ORDER — THERA M PLUS PO TABS
1.0000 | ORAL_TABLET | Freq: Every day | ORAL | Status: DC
  Administered 2010-12-24 – 2010-12-27 (×4): 1 via ORAL
  Filled 2010-12-23 (×4): qty 1

## 2010-12-23 MED ORDER — PREDNISONE 50 MG PO TABS
60.0000 mg | ORAL_TABLET | Freq: Once | ORAL | Status: DC
  Filled 2010-12-23: qty 1

## 2010-12-23 MED ORDER — DILTIAZEM HCL 30 MG PO TABS
30.0000 mg | ORAL_TABLET | Freq: Four times a day (QID) | ORAL | Status: DC
  Administered 2010-12-23 – 2010-12-25 (×7): 30 mg via ORAL
  Filled 2010-12-23 (×13): qty 1

## 2010-12-23 MED ORDER — ACETAMINOPHEN 325 MG PO TABS
650.0000 mg | ORAL_TABLET | ORAL | Status: DC | PRN
  Administered 2010-12-24: 650 mg via ORAL
  Filled 2010-12-23: qty 2

## 2010-12-23 MED ORDER — MORPHINE SULFATE 4 MG/ML IJ SOLN
4.0000 mg | Freq: Once | INTRAMUSCULAR | Status: AC
  Administered 2010-12-23: 4 mg via INTRAVENOUS
  Filled 2010-12-23: qty 1

## 2010-12-23 MED ORDER — NITROGLYCERIN 0.4 MG SL SUBL
0.4000 mg | SUBLINGUAL_TABLET | SUBLINGUAL | Status: AC | PRN
Start: 2010-12-23 — End: 2010-12-23
  Administered 2010-12-23 (×3): 0.4 mg via SUBLINGUAL
  Filled 2010-12-23: qty 25

## 2010-12-23 MED ORDER — TIOTROPIUM BROMIDE MONOHYDRATE 18 MCG IN CAPS
18.0000 ug | ORAL_CAPSULE | Freq: Every day | RESPIRATORY_TRACT | Status: DC
  Administered 2010-12-24 – 2010-12-27 (×4): 18 ug via RESPIRATORY_TRACT
  Filled 2010-12-23: qty 5

## 2010-12-23 MED ORDER — HEPARIN SODIUM (PORCINE) 5000 UNIT/ML IJ SOLN
5000.0000 [IU] | Freq: Three times a day (TID) | INTRAMUSCULAR | Status: DC
  Administered 2010-12-24 – 2010-12-27 (×11): 5000 [IU] via SUBCUTANEOUS
  Filled 2010-12-23 (×14): qty 1

## 2010-12-23 MED ORDER — MONTELUKAST SODIUM 10 MG PO TABS
10.0000 mg | ORAL_TABLET | Freq: Every day | ORAL | Status: DC
  Administered 2010-12-24 – 2010-12-26 (×3): 10 mg via ORAL
  Filled 2010-12-23 (×4): qty 1

## 2010-12-23 MED ORDER — ASPIRIN 300 MG RE SUPP: 300.0000 mg | RECTAL | Status: AC

## 2010-12-23 MED ORDER — IPRATROPIUM BROMIDE 0.02 % IN SOLN
0.5000 mg | Freq: Once | RESPIRATORY_TRACT | Status: AC
  Administered 2010-12-23: 0.5 mg via RESPIRATORY_TRACT
  Filled 2010-12-23: qty 2.5

## 2010-12-23 MED ORDER — ALBUTEROL SULFATE (5 MG/ML) 0.5% IN NEBU
2.5000 mg | INHALATION_SOLUTION | Freq: Once | RESPIRATORY_TRACT | Status: AC
  Administered 2010-12-23: 2.5 mg via RESPIRATORY_TRACT
  Filled 2010-12-23: qty 0.5

## 2010-12-23 MED ORDER — ALBUTEROL SULFATE HFA 108 (90 BASE) MCG/ACT IN AERS
2.0000 | INHALATION_SPRAY | Freq: Four times a day (QID) | RESPIRATORY_TRACT | Status: DC | PRN
  Administered 2010-12-24 – 2010-12-27 (×3): 2 via RESPIRATORY_TRACT
  Filled 2010-12-23 (×2): qty 6.7

## 2010-12-23 MED ORDER — VITAMIN D (ERGOCALCIFEROL) 1.25 MG (50000 UNIT) PO CAPS
50000.0000 [IU] | ORAL_CAPSULE | ORAL | Status: DC
  Administered 2010-12-24: 50000 [IU] via ORAL
  Filled 2010-12-23: qty 1

## 2010-12-23 MED ORDER — ASPIRIN 81 MG PO CHEW
81.0000 mg | CHEWABLE_TABLET | Freq: Every day | ORAL | Status: DC
  Administered 2010-12-24 – 2010-12-27 (×4): 81 mg via ORAL
  Filled 2010-12-23 (×6): qty 1

## 2010-12-23 MED ORDER — SIMVASTATIN 20 MG PO TABS
20.0000 mg | ORAL_TABLET | Freq: Every day | ORAL | Status: DC
  Administered 2010-12-24: 20 mg via ORAL
  Filled 2010-12-23 (×2): qty 1

## 2010-12-23 MED ORDER — SODIUM CHLORIDE 0.9 % IJ SOLN: 3.0000 mL | INTRAMUSCULAR | Status: DC | PRN

## 2010-12-23 NOTE — ED Notes (Signed)
Report called to [facility] on [id number]

## 2010-12-23 NOTE — ED Provider Notes (Signed)
Medical screening examination/treatment/procedure(s) were conducted as a shared visit with non-physician practitioner(s) and myself.  I personally evaluated the patient during the encounter.  79yf with CP. Pt just evaluated for same and felt to likley be 2/2 COPD exacerbation. Clinically not. Pt clear on exam and no respiratory distress. Pt is somewhat histrionic but complaining of bilateral "squeezing pressure and "hot"  [name] Razor, MD 12/23/10 1821

## 2010-12-23 NOTE — ED Notes (Signed)
Pt returned from CXR; per xray tech pt " passed out" momentarily while standing up, was eased to floor by tech. Headache "8/10", chest pain "9/10". 119/70. MD aware and in to see pt and talk with family.
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
## 2010-12-23 NOTE — ED Notes (Addendum)
Pt reports midsternal chest pain that started this morning states it goes all over here chest. Reports sob but has COPD. Reports headache. States was here a couple days ago for fast heart rate. Reports bilateral jaw pain.

## 2010-12-23 NOTE — ED Notes (Signed)
Pt drowsy. Grasps equal  And strong. MD notified and in room to evaluate pt. Pt O2 dropped to 90% on room air. O2 at 2 l via [place]. Resp therapy called to administer breathing treatment.

## 2010-12-23 NOTE — ED Notes (Signed)
Patient's son [name] contact numbers [phone] work cell [phone] personal cell

## 2010-12-23 NOTE — Telephone Encounter (Signed)
Pt's son calling re FMLA papers for his work, wanted to know if we have everything we need to prceed and how long will it be before it will be done

## 2010-12-23 NOTE — ED Notes (Signed)
Morphine held for now per MD verbal order.

## 2010-12-23 NOTE — ED Notes (Signed)
Attempted to Call report to floor, RN unavailable and will call back.

## 2010-12-23 NOTE — ED Notes (Signed)
Pale. C/o frontal headache extending to back of head and intermittently  c/o " black spot in my right eye". MD notified.

## 2010-12-23 NOTE — ED Notes (Signed)
To CT scan

## 2010-12-23 NOTE — ED Notes (Signed)
Pt c/o "9/10" left sided chest pain "bad" in describing type. Pt requesting inhaler. O 2 sats 95% on room air. Lung sounds clear bilaterally. No wheezes heard. MD aware.

## 2010-12-23 NOTE — ED Notes (Signed)
Chest pain "4//10" now. Vss. Alert.

## 2010-12-24 ENCOUNTER — Encounter: Payer: Self-pay | Admitting: Physician Assistant

## 2010-12-24 DIAGNOSIS — I471 Supraventricular tachycardia, unspecified: Secondary | ICD-10-CM

## 2010-12-24 DIAGNOSIS — M792 Neuralgia and neuritis, unspecified: Secondary | ICD-10-CM | POA: Diagnosis present

## 2010-12-24 DIAGNOSIS — I10 Essential (primary) hypertension: Secondary | ICD-10-CM | POA: Diagnosis present

## 2010-12-24 LAB — CBC
MCH: 32.6 pg (ref 26.0–34.0)
MCV: 97.8 fL (ref 78.0–100.0)
Platelets: 223 10*3/uL (ref 150–400)
RBC: 3.71 MIL/uL — ABNORMAL LOW (ref 3.87–5.11)
RDW: 13.4 % (ref 11.5–15.5)

## 2010-12-24 LAB — TSH: TSH: 0.773 u[IU]/mL (ref 0.350–4.500)

## 2010-12-24 LAB — CARDIAC PANEL(CRET KIN+CKTOT+MB+TROPI)
Relative Index: INVALID (ref 0.0–2.5)
Relative Index: INVALID (ref 0.0–2.5)
Relative Index: INVALID (ref 0.0–2.5)
Total CK: 41 U/L (ref 7–177)
Troponin I: <0.3 ng/mL (ref <0.30)
Troponin I: <0.3 ng/mL (ref <0.30)

## 2010-12-24 LAB — BASIC METABOLIC PANEL
CO2: 29 mEq/L (ref 19–32)
Calcium: 9.3 mg/dL (ref 8.4–10.5)
Creatinine, Ser: 0.58 mg/dL (ref 0.50–1.10)
GFR calc non Af Amer: 87 mL/min — ABNORMAL LOW (ref >90)
Glucose, Bld: 157 mg/dL — ABNORMAL HIGH (ref 70–99)
Sodium: 133 mEq/L — ABNORMAL LOW (ref 135–145)

## 2010-12-24 LAB — GLUCOSE, CAPILLARY
Glucose-Capillary: 126 mg/dL — ABNORMAL HIGH (ref 70–99)
Glucose-Capillary: 123 mg/dL — ABNORMAL HIGH (ref 70–99)
Glucose-Capillary: 134 mg/dL — ABNORMAL HIGH (ref 70–99)
Glucose-Capillary: 125 mg/dL — ABNORMAL HIGH (ref 70–99)

## 2010-12-24 MED ORDER — GABAPENTIN 300 MG PO CAPS
300.0000 mg | ORAL_CAPSULE | Freq: Two times a day (BID) | ORAL | Status: DC
  Administered 2010-12-24 – 2010-12-27 (×7): 300 mg via ORAL
  Filled 2010-12-24 (×8): qty 1
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
## 2010-12-24 NOTE — Progress Notes (Signed)
Subjective:  Thinks she has shingles on right side  No palpitations  Objective:  Vital Signs in the last 24 hours: Temp:  [97.8 F (36.6 C)-98.4 F (36.9 C)] 97.9 F (36.6 C) (11/10 0538) Pulse Rate:  [76-94] 92  (11/10 0538) Resp:  [16-25] 16  (11/10 0538) BP: (111-142)/(58-87) 119/70 mmHg (11/10 0538) SpO2:  [91 %-99 %] 91 % (11/10 0538) Weight:  [44.9 kg (98 lb 15.8 oz)] 98 lb 15.8 oz (44.9 kg) (11/09 2150)  Intake/Output from previous day: 11/09 0701 - 11/10 0700 In: 3 [I.V.:3] Out: -  Intake/Output from this shift:    Physical Exam: General appearance: alert Neck: no adenopathy, no carotid bruit, no JVD, supple, symmetrical, trachea midline and thyroid not enlarged, symmetric, no tenderness/mass/nodules Lungs: clear to auscultation bilaterally Heart: regular rate and rhythm, S1, S2 normal, no murmur, click, rub or gallop Extremities: extremities normal, atraumatic, no cyanosis or edema Pulses: 2+ and symmetric Skin: Skin color, texture, turgor normal. No rashes or lesions or She has seborhaic lesions on back but no characteristic herpetic lesions  Lab Results:  Basename 12/24/10 0600 12/23/10 1306  WBC 5.5 7.0  HGB 12.1 12.1  PLT 223 216    Basename 12/24/10 0600 12/23/10 1306  NA 133* 135  K 4.0 4.3  CL 96 99  CO2 29 28  GLUCOSE 157* 152*  BUN 14 18  CREATININE 0.58 0.61    Basename 12/24/10 0600 12/23/10  TROPONINI <0.30 <0.30   Hepatic Function Panel No results found for this basename: PROT,ALBUMIN,AST,ALT,ALKPHOS,BILITOT,BILIDIR,IBILI in the last 72 hours No results found for this basename: CHOL in the last 72 hours No results found for this basename: PROTIME in the last 72 hours  Imaging: Dg Chest 2 View  12/23/2010  *RADIOLOGY REPORT*  Clinical Data: Mid chest pain, weakness, nausea, dry heaves, headache, hypertension, diabetes  CHEST - 2 VIEW  Comparison: 12/21/2010  Findings: Loop recorder projects over left mid chest. Upper-normal size of  cardiac silhouette. Mediastinal contours and pulmonary vascular markings normal. Minimal prominence of right hilum stable, question due to prominent central pulmonary artery. Emphysematous and bronchitic changes. No pulmonary infiltrate, pleural effusion or pneumothorax. Bones diffusely demineralized. Old healed fracture middle third left clavicle.  IMPRESSION: Emphysematous and chronic bronchitic changes. No acute abnormalities.  Original Report Authenticated By: Lollie Marrow, M.D.   Ct Head Wo Contrast  12/23/2010  *RADIOLOGY REPORT*  Clinical Data: Severe headache  CT HEAD WITHOUT CONTRAST  Technique:  Contiguous axial images were obtained from the base of the skull through the vertex without contrast.  Comparison: 03/08/2009  Findings: No evidence of parenchymal hemorrhage or extra-axial fluid collection. No mass lesion, mass effect, or midline shift.  No CT evidence of acute infarction.  Subcortical white matter and periventricular small vessel ischemic changes.  Age related atrophy.  Near complete opacification of the right maxillary, right sphenoid, and bilateral ethmoid sinuses.  Partial opacification/fluid level in the left maxillary sinus.  Mucosal thickening in the left sphenoid sinus.  This appearance is grossly unchanged from prior studies.  No evidence of calvarial fracture.  IMPRESSION: No evidence of acute intracranial abnormality.  Age-related atrophy with small vessel ischemic changes  Chronic paranasal sinus opacification, as above.  Original Report Authenticated By: Charline Bills, M.D.    Cardiac Studies:  Assessment/Plan:  Arrhythmia:  Review of telemetry shows NSR with occasional PVC;s and no sustained arrythmia.  Continue calcium blocker.  ? Long term plan regarding mapping and restudy.  Will see if EPS can  see this weekend Dermatitis:  Not clear to me that patient has shingles.  Will ask IM to evaluate. SSCP:  ? Related to fast rate R/O with no acute ECG changes.  Likely  outpatient myovue.  LOS: 1 day    [name] 12/24/2010, 8:19 AM

## 2010-12-25 DIAGNOSIS — I471 Supraventricular tachycardia: Secondary | ICD-10-CM

## 2010-12-25 LAB — GLUCOSE, CAPILLARY
Glucose-Capillary: 117 mg/dL — ABNORMAL HIGH (ref 70–99)
Glucose-Capillary: 131 mg/dL — ABNORMAL HIGH (ref 70–99)
Glucose-Capillary: 109 mg/dL — ABNORMAL HIGH (ref 70–99)
Glucose-Capillary: 139 mg/dL — ABNORMAL HIGH (ref 70–99)
Glucose-Capillary: 121 mg/dL — ABNORMAL HIGH (ref 70–99)

## 2010-12-25 MED ORDER — ROSUVASTATIN CALCIUM 5 MG PO TABS
5.0000 mg | ORAL_TABLET | Freq: Every day | ORAL | Status: DC
  Administered 2010-12-25 – 2010-12-26 (×2): 5 mg via ORAL
  Filled 2010-12-25 (×3): qty 1

## 2010-12-25 MED ORDER — LOPERAMIDE HCL 2 MG PO CAPS: 2.0000 mg | ORAL_CAPSULE | ORAL | Status: DC | PRN

## 2010-12-25 MED ORDER — DILTIAZEM HCL ER COATED BEADS 180 MG PO CP24
180.0000 mg | ORAL_CAPSULE | Freq: Every day | ORAL | Status: DC
  Administered 2010-12-25 – 2010-12-27 (×3): 180 mg via ORAL
  Filled 2010-12-25 (×3): qty 1

## 2010-12-25 MED ORDER — ALUM & MAG HYDROXIDE-SIMETH 200-200-20 MG/5ML PO SUSP
15.0000 mL | ORAL | Status: DC | PRN
Start: 2010-12-25 — End: 2010-12-27

## 2010-12-25 MED ORDER — PRAMOXINE HCL 1 % RE OINT
1.0000 "application " | TOPICAL_OINTMENT | Freq: Three times a day (TID) | RECTAL | Status: DC | PRN
  Filled 2010-12-25: qty 30

## 2010-12-25 MED ORDER — MAGNESIUM HYDROXIDE 400 MG/5ML PO SUSP
30.0000 mL | Freq: Every day | ORAL | Status: DC | PRN
  Administered 2010-12-25 – 2010-12-26 (×2): 30 mL via ORAL
  Filled 2010-12-25 (×2): qty 30

## 2010-12-25 MED ORDER — LOPERAMIDE HCL 2 MG PO CAPS: 4.0000 mg | ORAL_CAPSULE | Freq: Once | ORAL | Status: AC | PRN

## 2010-12-25 MED ORDER — GUAIFENESIN-DM 100-10 MG/5ML PO SYRP: 15.0000 mL | ORAL_SOLUTION | ORAL | Status: DC | PRN

## 2010-12-25 NOTE — Progress Notes (Signed)
Subjective: Neuropathic pain is better, no vesicular eruption in the affected area yet.  Objective: Vital signs in last 24 hours: Temp:  [97.9 F (36.6 C)-98.3 F (36.8 C)] 98.3 F (36.8 C) (11/11 0635) Pulse Rate:  [77-82] 77  (11/11 0635) Resp:  [18] 18  (11/11 0635) BP: (122-137)/(73-82) 122/74 mmHg (11/11 0635) SpO2:  [93 %-96 %] 96 % (11/11 0635) Weight change:  Body mass index is 17.82 kg/(m^2).  Intake/Output from previous day: 11/10 0701 - 11/11 0700 In: 1200 [P.O.:1200] Out: -    PHYSICAL EXAM: Gen Exam: Awake and alert with clear speech.   Neck: Supple, No JVD.   Chest: B/L Clear.   CVS: S1 S2 Regular, no murmurs.  Abdomen: soft, BS +, non tender, non distended.  Extremities: no edema, warm.   Neurologic: Non Focal.   Skin: No vesicular eruption in the right mid lateral back area and right lateral chest. Wounds: N/A.    Lab Results:  Basename 12/24/10 0600 12/23/10 1306  WBC 5.5 7.0  HGB 12.1 12.1  HCT 36.3 36.2  PLT 223 216   CMET CMP     Component Value Date/Time   NA 133* 12/24/2010 0600   K 4.0 12/24/2010 0600   CL 96 12/24/2010 0600   CO2 29 12/24/2010 0600   GLUCOSE 157* 12/24/2010 0600   BUN 14 12/24/2010 0600   CREATININE 0.58 12/24/2010 0600   CALCIUM 9.3 12/24/2010 0600   PROT 6.1 12/16/2010 1916   ALBUMIN 3.5 12/16/2010 1916   AST 15 12/16/2010 1916   ALT 12 12/16/2010 1916   ALKPHOS 54 12/16/2010 1916   BILITOT 0.2* 12/16/2010 1916   GFRNONAA 87* 12/24/2010 0600   GFRAA >90 12/24/2010 0600    LIPIDS @LASTLIPID @ @LASTBNP @ COAGS No results found for this basename: PT:2,INR:2 in the last 72 hours  Studies/Results: Dg Chest 2 View  12/23/2010  *RADIOLOGY REPORT*  Clinical Data: Mid chest pain, weakness, nausea, dry heaves, headache, hypertension, diabetes  CHEST - 2 VIEW  Comparison: 12/21/2010  Findings: Loop recorder projects over left mid chest. Upper-normal size of cardiac silhouette. Mediastinal contours and pulmonary vascular  markings normal. Minimal prominence of right hilum stable, question due to prominent central pulmonary artery. Emphysematous and bronchitic changes. No pulmonary infiltrate, pleural effusion or pneumothorax. Bones diffusely demineralized. Old healed fracture middle third left clavicle.  IMPRESSION: Emphysematous and chronic bronchitic changes. No acute abnormalities.  Original Report Authenticated By: Lollie Marrow, M.D.   Ct Head Wo Contrast  12/23/2010  *RADIOLOGY REPORT*  Clinical Data: Severe headache  CT HEAD WITHOUT CONTRAST  Technique:  Contiguous axial images were obtained from the base of the skull through the vertex without contrast.  Comparison: 03/08/2009  Findings: No evidence of parenchymal hemorrhage or extra-axial fluid collection. No mass lesion, mass effect, or midline shift.  No CT evidence of acute infarction.  Subcortical white matter and periventricular small vessel ischemic changes.  Age related atrophy.  Near complete opacification of the right maxillary, right sphenoid, and bilateral ethmoid sinuses.  Partial opacification/fluid level in the left maxillary sinus.  Mucosal thickening in the left sphenoid sinus.  This appearance is grossly unchanged from prior studies.  No evidence of calvarial fracture.  IMPRESSION: No evidence of acute intracranial abnormality.  Age-related atrophy with small vessel ischemic changes  Chronic paranasal sinus opacification, as above.  Original Report Authenticated By: Charline Bills, M.D.    Medications:  Scheduled:   . aspirin  324 mg Oral NOW   Or  .  aspirin  300 mg Rectal NOW  . aspirin  81 mg Oral Daily  . diltiazem  180 mg Oral Daily  . gabapentin  300 mg Oral BID  . heparin  5,000 Units Subcutaneous Q8H  . montelukast  10 mg Oral QHS  . multivitamins ther. w/minerals  1 tablet Oral Daily  . predniSONE  60 mg Oral Once  . rosuvastatin  5 mg Oral q1800  . sodium chloride  3 mL Intravenous Q12H  . tiotropium  18 mcg Inhalation Daily    . Vitamin D (Ergocalciferol)  50,000 Units Oral Q7 days  . DISCONTD: diltiazem  30 mg Oral Q6H  . DISCONTD: simvastatin  20 mg Oral q1800   Continuous:   . sodium chloride      Assessment/Plan: Principal Problem:  *Neuralgia -This is better with Neurontin, no evidence of any shingles yet. -We'll continue with Neurontin and would monitor for any vesicular eruptions  Active Problems: -)SVT (supraventricular tachycardia) Per cardiology.  -)COPD -Stable continue with tapering prednisone and Spiriva.  -)Chronic respiratory failure -Stable, this is from COPD. Continue with O2.   -)HTN (hypertension) -Controlled with Cardizem  -) Hospitalist service will sign off, please call with any questions. Thank you  Maretta Bees, MD. 12/25/2010, 9:42 AM

## 2010-12-25 NOTE — Progress Notes (Signed)
SUBJECTIVE: The patient is doing reasonable well today. She reports feeling "weak".  At this time, she denies chest pain,palpitations, or any new concerns.  She has stable chronic dyspnea.  She does not feel that she is ready to be discharged today.   OBJECTIVE: Physical Exam: Filed Vitals:   12/24/10 0538 12/24/10 1300 12/24/10 2200 12/25/10 0635  BP: 119/70 136/82 137/73 122/74  Pulse: 92 82 80 77  Temp: 97.9 F (36.6 C) 97.9 F (36.6 C) 98.3 F (36.8 C) 98.3 F (36.8 C)  TempSrc: Oral Oral Oral Oral  Resp: 16 18 18 18   Height:      Weight:      SpO2: 91% 95% 93% 96%    Intake/Output Summary (Last 24 hours) at 12/25/10 0759 Last data filed at 12/24/10 1700  Gross per 24 hour  Intake    720 ml  Output      0 ml  Net    720 ml    Telemetry reveals sinus rhythm with pacs and nonsustained atrial tachycardia  GEN- The patient is thin and frail appearing, alert and oriented x 3 today.   Head- normocephalic, atraumatic Eyes-  Sclera clear, conjunctiva pink Ears- hearing intact Oropharynx- clear Neck- supple, no JVP Lymph- no cervical lymphadenopathy Lungs-  Diffuse expiratory wheezes, decreased BS at bases Heart- Regular rate and rhythm,  GI- soft, NT, ND, + BS Extremities- no clubbing, cyanosis, or edema Skin- no rash or lesion Psych- euthymic mood, full affect Neuro- strength and sensation are intact     . aspirin  324 mg Oral NOW   Or  . aspirin  300 mg Rectal NOW  . aspirin  81 mg Oral Daily  . diltiazem  30 mg Oral Q6H  . gabapentin  300 mg Oral BID  . heparin  5,000 Units Subcutaneous Q8H  . montelukast  10 mg Oral QHS  . multivitamins ther. w/minerals  1 tablet Oral Daily  . predniSONE  60 mg Oral Once  . simvastatin  20 mg Oral q1800  . sodium chloride  3 mL Intravenous Q12H  . tiotropium  18 mcg Inhalation Daily  . Vitamin D (Ergocalciferol)  50,000 Units Oral Q7 days     LABS: Basic Metabolic Panel:  Basename 12/24/10 0600 12/23/10 1306  NA  133* 135  K 4.0 4.3  CL 96 99  CO2 29 28  GLUCOSE 157* 152*  BUN 14 18  CREATININE 0.58 0.61  CALCIUM 9.3 9.0  MG -- --  PHOS -- --   Liver Function Tests: No results found for this basename: AST:2,ALT:2,ALKPHOS:2,BILITOT:2,PROT:2,ALBUMIN:2 in the last 72 hours No results found for this basename: LIPASE:2,AMYLASE:2 in the last 72 hours CBC:  Basename 12/24/10 0600 12/23/10 1306  WBC 5.5 7.0  NEUTROABS -- 5.9  HGB 12.1 12.1  HCT 36.3 36.2  MCV 97.8 97.3  PLT 223 216   Cardiac Enzymes:  Basename 12/24/10 1950 12/24/10 0600 12/23/10  CKTOTAL 40 36 41  CKMB 3.1 2.9 3.4  CKMBINDEX -- -- --  TROPONINI <0.30 <0.30 <0.30   BNP: No results found for this basename: POCBNP:3 in the last 72 hours D-Dimer: No results found for this basename: DDIMER:2 in the last 72 hours Hemoglobin A1C: No results found for this basename: HGBA1C in the last 72 hours Fasting Lipid Panel: No results found for this basename: CHOL,HDL,LDLCALC,TRIG,CHOLHDL,LDLDIRECT in the last 72 hours Thyroid Function Tests:  [facility] 12/23/10 2335  TSH 0.773  T4TOTAL --  T3FREE --  THYROIDAB --  Anemia Panel: No results found for this basename: VITAMINB12,FOLATE,FERRITIN,TIBC,IRON,RETICCTPCT in the last 72 hours  RADIOLOGY: Dg Chest 2 View  12/23/2010  *RADIOLOGY REPORT*  Clinical Data: Mid chest pain, weakness, nausea, dry heaves, headache, hypertension, diabetes  CHEST - 2 VIEW  Comparison: 12/21/2010  Findings: Loop recorder projects over left mid chest. Upper-normal size of cardiac silhouette. Mediastinal contours and pulmonary vascular markings normal. Minimal prominence of right hilum stable, question due to prominent central pulmonary artery. Emphysematous and bronchitic changes. No pulmonary infiltrate, pleural effusion or pneumothorax. Bones diffusely demineralized. Old healed fracture middle third left clavicle.  IMPRESSION: Emphysematous and chronic bronchitic changes. No acute abnormalities.   Original Report Authenticated By: Lollie Marrow, M.D.   Ct Head Wo Contrast  12/23/2010  *RADIOLOGY REPORT*  Clinical Data: Severe headache  CT HEAD WITHOUT CONTRAST  Technique:  Contiguous axial images were obtained from the base of the skull through the vertex without contrast.  Comparison: 03/08/2009  Findings: No evidence of parenchymal hemorrhage or extra-axial fluid collection. No mass lesion, mass effect, or midline shift.  No CT evidence of acute infarction.  Subcortical white matter and periventricular small vessel ischemic changes.  Age related atrophy.  Near complete opacification of the right maxillary, right sphenoid, and bilateral ethmoid sinuses.  Partial opacification/fluid level in the left maxillary sinus.  Mucosal thickening in the left sphenoid sinus.  This appearance is grossly unchanged from prior studies.  No evidence of calvarial fracture.  IMPRESSION: No evidence of acute intracranial abnormality.  Age-related atrophy with small vessel ischemic changes  Chronic paranasal sinus opacification, as above.  Original Report Authenticated By: Charline Bills, M.D.    ASSESSMENT AND PLAN:   1.  SVT- she has runs of atrial tachycardia (nonsustained) on telemetry,  Presently ILR print out is not on chart for me to review but per report reveals similar SVT.  Episodes likely due to worsened respiratory disease.  Will convert to daily Cardizem CD 180mg   Given overall poor health state, she is not a candidate for ablation presently.  She should be managed medically.  2.  Chronic lung disease- she has very poor air movement today,  She may benefit from home O2.  Will ask internal medicine (already following) to assist  3.  Atypical chest pain-  CMs negative,  Possible due to SVT or reactive lung disease,  Would consider outpatient myoview, though this would be challenging given her fragility.   She cannot exercise or undergo Lexiscan due to lung disease,  Would like to avoid dobutamine with  arrhythmias... Would be reasonable to treat medically and hold off on stress testing.  I will defer to Dr Ladona Ridgel.  4.  Deconditioning-  Hope to dc home next 1-2 days,  PT consult    Out of bed today   Hillis Range, MD 12/25/2010 7:59 AM

## 2010-12-26 ENCOUNTER — Telehealth (HOSPITAL_BASED_OUTPATIENT_CLINIC_OR_DEPARTMENT_OTHER): Payer: Self-pay | Admitting: Cardiology

## 2010-12-26 LAB — GLUCOSE, CAPILLARY
Glucose-Capillary: 130 mg/dL — ABNORMAL HIGH (ref 70–99)
Glucose-Capillary: 115 mg/dL — ABNORMAL HIGH (ref 70–99)
Glucose-Capillary: 123 mg/dL — ABNORMAL HIGH (ref 70–99)
Glucose-Capillary: 144 mg/dL — ABNORMAL HIGH (ref 70–99)

## 2010-12-26 NOTE — Telephone Encounter (Signed)
BC 
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
## 2010-12-26 NOTE — Progress Notes (Signed)
  Subjective:    Objective:  Vital Signs in the last 24 hours: Temp:  [97.9 F (36.6 C)-98.9 F (37.2 C)] 97.9 F (36.6 C) (11/12 1400) Pulse Rate:  [69-84] 84  (11/12 1400) Resp:  [18-20] 18  (11/12 1400) BP: (110-134)/(64-75) 110/64 mmHg (11/12 1400) SpO2:  [93 %-97 %] 93 % (11/12 1400)  Intake/Output from previous day: 11/11 0701 - 11/12 0700 In: 120 [P.O.:120] Out: -  Intake/Output from this shift: Total I/O In: 480 [P.O.:480] Out: -   Physical Exam: Elderly, frail appearing NAD HEENT: Unremarkable Neck:  No JVD, no thyromegally Lymphatics:  No adenopathy Back:  No CVA tenderness Lungs:  Clear without wheezes or rales. HEART:  IRegular rate rhythm, no murmurs, no rubs, no clicks Abd:  Flat, positive bowel sounds, no organomegally, no rebound, no guarding Ext:  2 plus pulses, no edema, no cyanosis, no clubbing Skin:  No rashes no nodules Neuro:  CN II through XII intact, motor grossly intact  Lab Results:  Basename 12/24/10 0600  WBC 5.5  HGB 12.1  PLT 223    Basename 12/24/10 0600  NA 133*  K 4.0  CL 96  CO2 29  GLUCOSE 157*  BUN 14  CREATININE 0.58    Basename 12/24/10 1950 12/24/10 0600  TROPONINI <0.30 <0.30   Hepatic Function Panel No results found for this basename: PROT,ALBUMIN,AST,ALT,ALKPHOS,BILITOT,BILIDIR,IBILI in the last 72 hours No results found for this basename: CHOL in the last 72 hours No results found for this basename: PROTIME in the last 72 hours   Cardiac Studies: Tele - NSR with NSSVT Assessment/Plan:   Chronic respiratory failure - her dyspnea is improved. Etiology is multi-factorial. Will follow.   SVT (supraventricular tachycardia) - her symptoms are reasonably well controlled. No indication for repeat ablation.  HTN (hypertension) - her blood pressure is well controlled. Will follow.    LOS: 3 days    [name] 12/26/2010, 6:57 PM

## 2010-12-27 ENCOUNTER — Telehealth: Payer: Self-pay | Admitting: Internal Medicine

## 2010-12-27 DIAGNOSIS — I519 Heart disease, unspecified: Secondary | ICD-10-CM

## 2010-12-27 LAB — BASIC METABOLIC PANEL
BUN: 14 mg/dL (ref 6–23)
GFR calc Af Amer: >90 mL/min (ref >90)
GFR calc non Af Amer: 89 mL/min — ABNORMAL LOW (ref >90)
Potassium: 4.5 mEq/L (ref 3.5–5.1)
Sodium: 130 mEq/L — ABNORMAL LOW (ref 135–145)

## 2010-12-27 LAB — CBC
Hemoglobin: 12.7 g/dL (ref 12.0–15.0)
MCHC: 33.8 g/dL (ref 30.0–36.0)

## 2010-12-27 LAB — GLUCOSE, CAPILLARY: Glucose-Capillary: 133 mg/dL — ABNORMAL HIGH (ref 70–99)

## 2010-12-27 MED ORDER — GABAPENTIN 300 MG PO CAPS: 300.0000 mg | ORAL_CAPSULE | Freq: Two times a day (BID) | ORAL | Status: DC

## 2010-12-27 MED ORDER — DILTIAZEM HCL ER COATED BEADS 180 MG PO CP24: 180.0000 mg | ORAL_CAPSULE | Freq: Every day | ORAL | Status: DC

## 2010-12-27 NOTE — Progress Notes (Signed)
CM spoke to RN and pt needs home 02 via [place] 2l continous/shower chair. CM made referral with [facility] for services.  DME to be delivered to room. Gala Lewandowsky

## 2010-12-27 NOTE — Telephone Encounter (Signed)
p 

## 2010-12-27 NOTE — Discharge Summary (Signed)
Physician Discharge Summary  Patient ID: [name],  MRN: [id number], DOB/AGE: 07/09/1931 77 y.o.  Admit date: 12/23/2010 Discharge date: 12/27/2010  Primary Discharge Diagnosis:  1. Palpitations consistent with recurrent SVT  A. History of SVT s/p RF ablation 2000 2. Dyspnea felt multifactorial with chronic hypoxic respiratory failure  a. Qualified for O2 on ambulation this admission (dropped to 84% RA on ambulation) 3. COPD 4. LV dysfunction - Mild, EF 45-50% by echocardiogram 02/2009   5. Neuralgia right flank - ?secondary to prior shingles - improved with neurontin  Secondary Discharge Diagnosis:  1. Allergic rhinitis     2. Diabetes mellitus     3. Prior hx of DVT 4. Syncope with [employer] loop recorder implantation 03/2009    Hospital Course: Ms. Coburn is a 77 y/o F with a hx SVT ablation, COPD, DM and syncope for which loop recorder has been implanted presents with CP/headche. She had been seen in the office the day prior to admission after going straight from the hospital having been d/c'd for asthma. She was prescribed prednisone at the hospital, but states she didn't take it because she was afraid of taking it. She also d/c'd her nebulizer at home in lieu of HFA because it gave her palpitations. Loop recorder was interrogated in office the day prior to admission demonstrating many fast rates as fast as 320-344 ms, faster rates which correlated with nebulizer treatments. Digoxin was initiated with plans for 0.125mg  x 2 yesterday and starting 1 tablet today, but the patient had only taken 1 tablet by the time we saw her in the ER.  She presented with the CC of headache, chest pain, and SOB. CT of the head by the EDP was negative for acute disease. She complained of intermittent chest pain for several weeks both on exertion and at rest, most recently lasting hours at a time. She hadn't tried anything at home to make it better and didn't think exertion made it worse. She had relief of  said pain in the ER with a neb treatment. EKG was without ST acute changes although did show a brief run of SVT at the beginning of the EKG. Her loop monitor was interrogated and showed 22 episodes of high ventricular rate, rates ~200 appearing to be supraventricular in nature. Had occasional PVC's that had a totally different morphology. Had an episode today as well - ?SVT vs. Rapid afib - it was later determined by Dr. Johney Frame and Dr. Ladona Ridgel that this was indeed recurrence of SVT.  Dr. Jens Som discontinued her digoxin. Her metoprolol and cozaar was also held to make room in blood pressure for the initiation of diltiazem, which helped quiet her arrythmia. Dr. Ladona Ridgel noted today that she may ultimately require flecainide, but will hold off on this for now.  Her chest pain was felt atypical. Troponins were negative x 4. No further ischemic w/u was felt necessary. Of note, she also complained of a rash/itching sensation on her right flank in a spot where she had previously had shingles. IM consulted with [place] and felt this to represent neuralgia and initiated neurontin which did provide relief. There was no evidence for vesicular rash eruption or recurrent shingles. Her shortness of breath was also assessed and felt to be multifactorial, perhaps related to her underlying lung disease. On day of discharge she ambulated and dropped to 84% on RA - oxygen at 2LPM by [place] placed with resolution of pulse of to 93%. Dr. Ladona Ridgel would like her to go  home with supplemental oxygen will be arranged at discharge. Dr. Ladona Ridgel has seen and examined her today and feels she is stable for discharge.   Discharge Vitals: Blood pressure 104/67, pulse 79, temperature 98.1 F (36.7 C), temperature source Oral, resp. rate 16, height 5' 2.5" (1.588 m), weight 98 lb 15.8 oz (44.9 kg), SpO2 93.00%.  Labs: Lab Results  Component Value Date   WBC 4.6 12/27/2010   HGB 12.7 12/27/2010   HCT 37.6 12/27/2010   MCV 96.7 12/27/2010   PLT 234  12/27/2010    Lab 12/27/10 0525  NA 130*  K 4.5  CL 92*  CO2 30  BUN 14  CREATININE 0.54  CALCIUM 9.2  PROT --  BILITOT --  ALKPHOS --  ALT --  AST --  GLUCOSE 116*    Basename 12/24/10 1950  CKTOTAL 40  CKMB 3.1  TROPONINI <0.30   Diagnostic Studies/Procedures:  12/23/2010  CHEST - 2 VIEW  Comparison: IMPRESSION: Emphysematous and chronic bronchitic changes. No acute abnormalities.   12/21/2010  CHEST - 2 VIEW   IMPRESSION: Chronic emphysematous changes and pulmonary scarring without acute overlying pulmonary process.    12/23/2010  CT HEAD WITHOUT CONTRAST IMPRESSION: No evidence of acute intracranial abnormality.  Age-related atrophy with small vessel ischemic changes  Chronic paranasal sinus opacification, as above.   Current Discharge Medication List    START taking these medications   Details  diltiazem (CARDIZEM CD) 180 MG 24 hr capsule Take 1 capsule (180 mg total) by mouth daily. Qty: 30 capsule, Refills: 6    gabapentin (NEURONTIN) 300 MG capsule Take 1 capsule (300 mg total) by mouth 2 (two) times daily. Qty: 60 capsule, Refills: 0 *she is to f/u with PCP for this medicine      CONTINUE these medications which have NOT CHANGED   Details  acetaminophen (TYLENOL) 500 MG tablet Take 500 mg by mouth as needed. For pain    albuterol (PROAIR HFA) 108 (90 BASE) MCG/ACT inhaler For shortness of breath or wheeze    aspirin 81 MG tablet Take 81-162 mg by mouth daily. Patient takes 1 tablet daily, but takes 2 tablets if in pain    Cholecalciferol (VITAMIN D3) 50000 UNITS CAPS Take 50,000 Units by mouth once a week. On Sundays    montelukast (SINGULAIR) 10 MG tablet TAKE 1 TABLET BY MOUTH ONCE A DAY    Multiple Vitamin (MULTIVITAMIN) tablet Take 1 tablet by mouth daily.     pravastatin (PRAVACHOL) 40 MG tablet Take 40 mg by mouth daily.      SPIRIVA HANDIHALER 18 MCG inhalation capsule INHALE 1 CAPSULE DAILY *DO NOT SWALLOW CAPSULE*     albuterol (PROVENTIL)  (2.5 MG/3ML) 0.083% nebulizer solution Take 2.5 mg by nebulization every 6 (six) hours as needed. For shortness of breath or wheeze       STOP taking these medications     digoxin (LANOXIN) 0.125 MG tablet      losartan (COZAAR) 50 MG tablet      metoprolol tartrate (LOPRESSOR) 25 MG tablet      predniSONE (DELTASONE) 20 MG tablet      promethazine-codeine (PHENERGAN WITH CODEINE) 6.25-10 MG/5ML syrup       Please note this admission Cozaar and Metoprolol were discontinued in lieu of diltiazem. Given her SBP 104-122 systolic, will hold these meds at discharge. May consider resuming ARB at followup.   Disposition:  The patient will be discharged in stable condition to home. Discharge Orders  Future Appointments: Provider: Department: Dept Phone: Center:   01/12/2011 9:00 AM [name], MD [facility] [phone] [id number]   03/14/2011 9:00 AM Waymon Budge, MD [facility] [phone] None     Future Orders Please Complete By Expires   Diet - low sodium heart healthy      Increase activity slowly          Duration of Discharge Encounter: Greater than 30 minutes including physician and PA time.  Signed, Ronie Spies PA-C 12/27/2010, 11:58 AM

## 2010-12-27 NOTE — Progress Notes (Signed)
  Subjective:  No palpitations. Dyspnea is improved.   Objective:  Vital Signs in the last 24 hours: Temp:  [97.9 F (36.6 C)-98.3 F (36.8 C)] 98.1 F (36.7 C) (11/13 0634) Pulse Rate:  [79-92] 79  (11/13 0634) Resp:  [16-20] 16  (11/13 0634) BP: (110-122)/(64-77) 122/77 mmHg (11/13 0634) SpO2:  [91 %-94 %] 91 % (11/13 0815)  Intake/Output from previous day: 11/12 0701 - 11/13 0700 In: 480 [P.O.:480] Out: -  Intake/Output from this shift: Total I/O In: 240 [P.O.:240] Out: -   Physical Exam: Well appearing NAD HEENT: Unremarkable Neck:  No JVD, no thyromegally Lymphatics:  No adenopathy Back:  No CVA tenderness Lungs:  Clear with no wheezes. HEART:  Regular rate rhythm, no murmurs, no rubs, no clicks Abd:  Flat, positive bowel sounds, no organomegally, no rebound, no guarding Ext:  2 plus pulses, no edema, no cyanosis, no clubbing Skin:  No rashes no nodules Neuro:  CN II through XII intact, motor grossly intact  Lab Results:  [facility] 12/27/10 0525  WBC 4.6  HGB 12.7  PLT 234    Basename 12/27/10 0525  NA 130*  K 4.5  CL 92*  CO2 30  GLUCOSE 116*  BUN 14  CREATININE 0.54    Basename 12/24/10 1950  TROPONINI <0.30   Hepatic Function Panel No results found for this basename: PROT,ALBUMIN,AST,ALT,ALKPHOS,BILITOT,BILIDIR,IBILI in the last 72 hours No results found for this basename: CHOL in the last 72 hours No results found for this basename: PROTIME in the last 72 hours    Assessment/Plan:  SVT - Her symptoms are controlled. She may ultimately require flecainide but will hold off on this for now.  COPD - she appears at baseline. I will ask her to ambulate with pulse oximetry prior to discharge to be certain she does not need supplemental oxygen. Plan to discharge today on current meds plus/minus supplemental oxygen.   LOS: 4 days    [name] 12/27/2010, 9:48 AM

## 2010-12-27 NOTE — Telephone Encounter (Signed)
Spoke to the patients son tonight.  She came home from the hospital and took a few of her meds that had been discontinued.  She took 12.5mg  of metoprolol and 50mg  of losartan tonight.  She also took her diltiazem that was not due until the morning.  She is feeling fine and is going to bed for the night.  I instructed her son to throw away the meds that were discontinued in the hospital.   He is staying with her from now on and will keep up with her medications and will be with her tonight.

## 2010-12-27 NOTE — Discharge Planning (Addendum)
Patient was ambulated 110 ft with assistance to desk and back, oxygen sat dropped to 84% , oxygen at 2LPM by [place] placed with resolution of sat to 93% on 2LPM via [place], patient is slightly unsteady, states has a walker at home, admits to not using it all the time, Berle Mull RN, patient's RA sat prior to ambulating was 94%, Jyl Heinz RN

## 2010-12-27 NOTE — Discharge Planning (Signed)
Patient discharge instructions reviewed with patient and son, information sheets on new prescribed med, neurontin and diltiazem given, verbalizes understanding of discharge instructions and followup, has RX , case manager arranging home oxygen for ambulation needs at 2LPM via [place] and shower chair(3 in 1) for home use, Berle Mull RN

## 2010-12-28 ENCOUNTER — Encounter: Payer: Medicare Other | Admitting: *Deleted

## 2010-12-30 ENCOUNTER — Telehealth: Payer: Self-pay | Admitting: Physician Assistant

## 2010-12-30 NOTE — Telephone Encounter (Signed)
Mr [name] called because pt had severe HA and dizzyness. She has been seen for this recently but this time had no CP. She feels she needs eval and is too dizzy to walk. Advised him to call EMS and take her to ER. Have ER MD eval her and call us PRN. He will do so.

## 2011-01-01 ENCOUNTER — Inpatient Hospital Stay (HOSPITAL_COMMUNITY)
Admission: EM | Admit: 2011-01-01 | Discharge: 2011-01-03 | DRG: 191 | Disposition: A | Discharge status: Home / Self Care | Payer: Medicare Other | Attending: Internal Medicine | Admitting: Internal Medicine

## 2011-01-01 ENCOUNTER — Emergency Department (HOSPITAL_COMMUNITY): Payer: Medicare Other

## 2011-01-01 ENCOUNTER — Telehealth: Payer: Self-pay | Admitting: Physician Assistant

## 2011-01-01 ENCOUNTER — Other Ambulatory Visit: Payer: Self-pay

## 2011-01-01 ENCOUNTER — Encounter (HOSPITAL_COMMUNITY): Payer: Self-pay | Admitting: *Deleted

## 2011-01-01 DIAGNOSIS — R55 Syncope and collapse: Secondary | ICD-10-CM

## 2011-01-01 DIAGNOSIS — J309 Allergic rhinitis, unspecified: Secondary | ICD-10-CM | POA: Diagnosis present

## 2011-01-01 DIAGNOSIS — IMO0001 Reserved for inherently not codable concepts without codable children: Secondary | ICD-10-CM | POA: Diagnosis present

## 2011-01-01 DIAGNOSIS — Z86718 Personal history of other venous thrombosis and embolism: Secondary | ICD-10-CM

## 2011-01-01 DIAGNOSIS — I519 Heart disease, unspecified: Secondary | ICD-10-CM | POA: Diagnosis present

## 2011-01-01 DIAGNOSIS — R410 Disorientation, unspecified: Secondary | ICD-10-CM | POA: Diagnosis present

## 2011-01-01 DIAGNOSIS — R079 Chest pain, unspecified: Secondary | ICD-10-CM | POA: Diagnosis present

## 2011-01-01 DIAGNOSIS — I471 Supraventricular tachycardia: Secondary | ICD-10-CM | POA: Diagnosis present

## 2011-01-01 DIAGNOSIS — J441 Chronic obstructive pulmonary disease with (acute) exacerbation: Principal | ICD-10-CM | POA: Diagnosis present

## 2011-01-01 DIAGNOSIS — I10 Essential (primary) hypertension: Secondary | ICD-10-CM | POA: Diagnosis present

## 2011-01-01 DIAGNOSIS — J189 Pneumonia, unspecified organism: Secondary | ICD-10-CM

## 2011-01-01 DIAGNOSIS — J9610 Chronic respiratory failure, unspecified whether with hypoxia or hypercapnia: Secondary | ICD-10-CM | POA: Diagnosis present

## 2011-01-01 DIAGNOSIS — Z8679 Personal history of other diseases of the circulatory system: Secondary | ICD-10-CM

## 2011-01-01 DIAGNOSIS — R404 Transient alteration of awareness: Secondary | ICD-10-CM | POA: Diagnosis present

## 2011-01-01 DIAGNOSIS — R443 Hallucinations, unspecified: Secondary | ICD-10-CM | POA: Diagnosis present

## 2011-01-01 DIAGNOSIS — J329 Chronic sinusitis, unspecified: Secondary | ICD-10-CM | POA: Diagnosis present

## 2011-01-01 DIAGNOSIS — Z950 Presence of cardiac pacemaker: Secondary | ICD-10-CM

## 2011-01-01 DIAGNOSIS — E119 Type 2 diabetes mellitus without complications: Secondary | ICD-10-CM | POA: Diagnosis present

## 2011-01-01 HISTORY — DX: Essential (primary) hypertension: I10

## 2011-01-01 HISTORY — DX: Anxiety disorder, unspecified: F41.9

## 2011-01-01 LAB — POCT I-STAT TROPONIN I

## 2011-01-01 LAB — BASIC METABOLIC PANEL
BUN: 16 mg/dL (ref 6–23)
GFR calc Af Amer: >90 mL/min (ref >90)
GFR calc non Af Amer: 81 mL/min — ABNORMAL LOW (ref >90)
Potassium: 4.5 mEq/L (ref 3.5–5.1)
Sodium: 132 mEq/L — ABNORMAL LOW (ref 135–145)

## 2011-01-01 LAB — CBC
MCH: 32.6 pg (ref 26.0–34.0)
MCHC: 33.1 g/dL (ref 30.0–36.0)
Platelets: 290 10*3/uL (ref 150–400)
RDW: 13.6 % (ref 11.5–15.5)

## 2011-01-01 LAB — DIFFERENTIAL
Basophils Absolute: 0 10*3/uL (ref 0.0–0.1)
Basophils Relative: 0 % (ref 0–1)
Eosinophils Absolute: 0.1 10*3/uL (ref 0.0–0.7)
Monocytes Relative: 8 % (ref 3–12)
Neutrophils Relative %: 68 % (ref 43–77)

## 2011-01-01 MED ORDER — ASPIRIN 81 MG PO CHEW
324.0000 mg | CHEWABLE_TABLET | Freq: Once | ORAL | Status: AC
  Administered 2011-01-01: 324 mg via ORAL
  Filled 2011-01-01: qty 4

## 2011-01-01 MED ORDER — ALBUTEROL SULFATE (5 MG/ML) 0.5% IN NEBU
5.0000 mg | INHALATION_SOLUTION | Freq: Once | RESPIRATORY_TRACT | Status: AC
  Administered 2011-01-01: 5 mg via RESPIRATORY_TRACT
  Filled 2011-01-01: qty 1

## 2011-01-01 MED ORDER — ONDANSETRON HCL 4 MG/2ML IJ SOLN
4.0000 mg | Freq: Once | INTRAMUSCULAR | Status: AC
  Administered 2011-01-01: 4 mg via INTRAVENOUS
  Filled 2011-01-01: qty 2

## 2011-01-01 MED ORDER — DEXTROSE 5 % IV SOLN: 1.0000 g | Freq: Once | INTRAVENOUS | Status: DC

## 2011-01-01 MED ORDER — DEXTROSE 5 % IV SOLN: 500.0000 mg | Freq: Once | INTRAVENOUS | Status: DC

## 2011-01-01 MED ORDER — SODIUM CHLORIDE 0.9 % IV SOLN
Freq: Once | INTRAVENOUS | Status: AC
  Administered 2011-01-01: 17:00:00 via INTRAVENOUS

## 2011-01-01 MED ORDER — IPRATROPIUM BROMIDE 0.02 % IN SOLN
0.5000 mg | Freq: Once | RESPIRATORY_TRACT | Status: AC
  Administered 2011-01-01: 0.5 mg via RESPIRATORY_TRACT
  Filled 2011-01-01: qty 2.5

## 2011-01-01 MED ORDER — MOXIFLOXACIN HCL IN NACL 400 MG/250ML IV SOLN
400.0000 mg | Freq: Once | INTRAVENOUS | Status: AC
  Administered 2011-01-01: 400 mg via INTRAVENOUS
  Filled 2011-01-01 (×2): qty 250

## 2011-01-01 MED ORDER — PREDNISONE 20 MG PO TABS
40.0000 mg | ORAL_TABLET | Freq: Once | ORAL | Status: AC
  Administered 2011-01-01: 40 mg via ORAL
  Filled 2011-01-01: qty 2

## 2011-01-01 NOTE — Telephone Encounter (Signed)
Patient's son called. The patient has been having chest pain, shortness of breath and nausea for the last 45 minutes.  Her son states that she looks like she does not feel good.  I have recommended he call 911 and he agreed. Tereso Newcomer, PA-C  3:10 PM 01/01/2011

## 2011-01-01 NOTE — ED Notes (Signed)
Floor nurse at dinner break--will call back.  Dr. Toniann Fail in and out of room to do admit.  Son remains in room.
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
## 2011-01-01 NOTE — ED Notes (Signed)
Pt ambulated to BR to void--pulse on pt with SATs of 95-96 and HR 80's.  Dr. Alto Denver notified.

## 2011-01-01 NOTE — ED Notes (Signed)
Pt began having chest pressure today and this has been associated with nausea and sob.

## 2011-01-01 NOTE — ED Notes (Signed)
MD at bedside. 

## 2011-01-02 ENCOUNTER — Emergency Department (HOSPITAL_COMMUNITY): Payer: Medicare Other

## 2011-01-02 ENCOUNTER — Encounter (HOSPITAL_COMMUNITY): Payer: Self-pay | Admitting: *Deleted

## 2011-01-02 ENCOUNTER — Inpatient Hospital Stay (HOSPITAL_COMMUNITY): Payer: Medicare Other

## 2011-01-02 DIAGNOSIS — R0602 Shortness of breath: Secondary | ICD-10-CM

## 2011-01-02 LAB — CBC
HCT: 34.6 % — ABNORMAL LOW (ref 36.0–46.0)
MCH: 32.5 pg (ref 26.0–34.0)
MCV: 97.7 fL (ref 78.0–100.0)
Platelets: 267 10*3/uL (ref 150–400)
RBC: 3.54 MIL/uL — ABNORMAL LOW (ref 3.87–5.11)

## 2011-01-02 LAB — CARDIAC PANEL(CRET KIN+CKTOT+MB+TROPI)
CK, MB: 3.7 ng/mL (ref 0.3–4.0)
Relative Index: INVALID (ref 0.0–2.5)
Relative Index: INVALID (ref 0.0–2.5)
Total CK: 37 U/L (ref 7–177)
Total CK: 31 U/L (ref 7–177)
Troponin I: <0.3 ng/mL (ref <0.30)

## 2011-01-02 LAB — COMPREHENSIVE METABOLIC PANEL
AST: 17 U/L (ref 0–37)
BUN: 12 mg/dL (ref 6–23)
CO2: 27 mEq/L (ref 19–32)
Calcium: 8.9 mg/dL (ref 8.4–10.5)
Creatinine, Ser: 0.54 mg/dL (ref 0.50–1.10)
GFR calc Af Amer: >90 mL/min (ref >90)
GFR calc non Af Amer: 89 mL/min — ABNORMAL LOW (ref >90)
Glucose, Bld: 204 mg/dL — ABNORMAL HIGH (ref 70–99)

## 2011-01-02 LAB — MAGNESIUM: Magnesium: 1.8 mg/dL (ref 1.5–2.5)

## 2011-01-02 LAB — TSH: TSH: 0.408 u[IU]/mL (ref 0.350–4.500)

## 2011-01-02 LAB — GLUCOSE, CAPILLARY
Glucose-Capillary: 183 mg/dL — ABNORMAL HIGH (ref 70–99)
Glucose-Capillary: 157 mg/dL — ABNORMAL HIGH (ref 70–99)

## 2011-01-02 LAB — RAPID URINE DRUG SCREEN, HOSP PERFORMED
Amphetamines: NOT DETECTED
Barbiturates: NOT DETECTED
Benzodiazepines: NOT DETECTED

## 2011-01-02 LAB — AMMONIA: Ammonia: 17 umol/L (ref 11–60)

## 2011-01-02 MED ORDER — ALPRAZOLAM 0.25 MG PO TBDP: 0.2500 mg | ORAL_TABLET | Freq: Every evening | ORAL | Status: DC | PRN

## 2011-01-02 MED ORDER — ONDANSETRON HCL 4 MG/2ML IJ SOLN: 4.0000 mg | Freq: Four times a day (QID) | INTRAMUSCULAR | Status: DC | PRN

## 2011-01-02 MED ORDER — ASPIRIN 81 MG PO TABS
81.0000 mg | ORAL_TABLET | Freq: Every day | ORAL | Status: DC
  Administered 2011-01-02 – 2011-01-03 (×2): 81 mg via ORAL
  Filled 2011-01-02 (×4): qty 1

## 2011-01-02 MED ORDER — TECHNETIUM TO 99M ALBUMIN AGGREGATED
6.0000 | Freq: Once | INTRAVENOUS | Status: AC | PRN
  Administered 2011-01-02: 6 via INTRAVENOUS

## 2011-01-02 MED ORDER — ACETAMINOPHEN 650 MG RE SUPP: 650.0000 mg | Freq: Four times a day (QID) | RECTAL | Status: DC | PRN

## 2011-01-02 MED ORDER — SODIUM CHLORIDE 0.9 % IJ SOLN
3.0000 mL | Freq: Two times a day (BID) | INTRAMUSCULAR | Status: DC
  Administered 2011-01-02 – 2011-01-03 (×3): 3 mL via INTRAVENOUS

## 2011-01-02 MED ORDER — LEVALBUTEROL HCL 0.63 MG/3ML IN NEBU
0.6300 mg | INHALATION_SOLUTION | Freq: Four times a day (QID) | RESPIRATORY_TRACT | Status: DC | PRN
  Filled 2011-01-02: qty 3

## 2011-01-02 MED ORDER — PREDNISONE 20 MG PO TABS
20.0000 mg | ORAL_TABLET | Freq: Every day | ORAL | Status: AC
  Administered 2011-01-03: 20 mg via ORAL
  Filled 2011-01-02: qty 1

## 2011-01-02 MED ORDER — DILTIAZEM HCL ER COATED BEADS 180 MG PO CP24
180.0000 mg | ORAL_CAPSULE | Freq: Every day | ORAL | Status: DC
  Administered 2011-01-02 – 2011-01-03 (×2): 180 mg via ORAL
  Filled 2011-01-02 (×2): qty 1

## 2011-01-02 MED ORDER — INSULIN ASPART 100 UNIT/ML ~~LOC~~ SOLN
0.0000 [IU] | Freq: Three times a day (TID) | SUBCUTANEOUS | Status: DC
  Administered 2011-01-02: 3 [IU] via SUBCUTANEOUS
  Administered 2011-01-02: 1 [IU] via SUBCUTANEOUS
  Administered 2011-01-02: 3 [IU] via SUBCUTANEOUS
  Administered 2011-01-03 (×2): 1 [IU] via SUBCUTANEOUS
  Filled 2011-01-02: qty 3

## 2011-01-02 MED ORDER — LEVALBUTEROL HCL 0.63 MG/3ML IN NEBU
0.6300 mg | INHALATION_SOLUTION | Freq: Four times a day (QID) | RESPIRATORY_TRACT | Status: DC
  Administered 2011-01-02 – 2011-01-03 (×7): 0.63 mg via RESPIRATORY_TRACT
  Filled 2011-01-02 (×10): qty 3

## 2011-01-02 MED ORDER — XENON XE 133 GAS
10.0000 | GAS_FOR_INHALATION | Freq: Once | RESPIRATORY_TRACT | Status: AC | PRN
  Administered 2011-01-02: 10 via RESPIRATORY_TRACT

## 2011-01-02 MED ORDER — ALPRAZOLAM 0.25 MG PO TABS
0.2500 mg | ORAL_TABLET | Freq: Every evening | ORAL | Status: DC | PRN
  Administered 2011-01-03: 0.25 mg via ORAL
  Filled 2011-01-02: qty 1

## 2011-01-02 MED ORDER — ALBUTEROL SULFATE HFA 108 (90 BASE) MCG/ACT IN AERS
1.0000 | INHALATION_SPRAY | Freq: Four times a day (QID) | RESPIRATORY_TRACT | Status: DC | PRN
Start: 2011-01-02 — End: 2011-01-03
  Filled 2011-01-02: qty 6.7

## 2011-01-02 MED ORDER — ACETAMINOPHEN 325 MG PO TABS
650.0000 mg | ORAL_TABLET | Freq: Four times a day (QID) | ORAL | Status: DC | PRN
  Administered 2011-01-02: 650 mg via ORAL
  Filled 2011-01-02: qty 2

## 2011-01-02 MED ORDER — ROSUVASTATIN CALCIUM 5 MG PO TABS
5.0000 mg | ORAL_TABLET | Freq: Every day | ORAL | Status: DC
  Administered 2011-01-02 – 2011-01-03 (×2): 5 mg via ORAL
  Filled 2011-01-02 (×2): qty 1

## 2011-01-02 MED ORDER — BUDESONIDE 0.5 MG/2ML IN SUSP
0.5000 mg | Freq: Two times a day (BID) | RESPIRATORY_TRACT | Status: DC
  Administered 2011-01-02 – 2011-01-03 (×3): 0.5 mg via RESPIRATORY_TRACT
  Filled 2011-01-02 (×5): qty 2

## 2011-01-02 MED ORDER — TIOTROPIUM BROMIDE MONOHYDRATE 18 MCG IN CAPS
18.0000 ug | ORAL_CAPSULE | Freq: Every day | RESPIRATORY_TRACT | Status: DC
  Administered 2011-01-02 – 2011-01-03 (×2): 18 ug via RESPIRATORY_TRACT
  Filled 2011-01-02: qty 5

## 2011-01-02 MED ORDER — PREDNISONE 5 MG PO TABS: 5.0000 mg | ORAL_TABLET | Freq: Every day | ORAL | Status: DC

## 2011-01-02 MED ORDER — MONTELUKAST SODIUM 10 MG PO TABS
10.0000 mg | ORAL_TABLET | Freq: Every day | ORAL | Status: DC
  Filled 2011-01-02 (×2): qty 1

## 2011-01-02 MED ORDER — THERA M PLUS PO TABS
1.0000 | ORAL_TABLET | Freq: Every day | ORAL | Status: DC
  Administered 2011-01-02 – 2011-01-03 (×2): 1 via ORAL
  Filled 2011-01-02 (×2): qty 1

## 2011-01-02 MED ORDER — DOCUSATE SODIUM 100 MG PO CAPS
100.0000 mg | ORAL_CAPSULE | Freq: Two times a day (BID) | ORAL | Status: DC
  Administered 2011-01-02 – 2011-01-03 (×3): 100 mg via ORAL
  Filled 2011-01-02 (×4): qty 1

## 2011-01-02 MED ORDER — GABAPENTIN 300 MG PO CAPS
300.0000 mg | ORAL_CAPSULE | Freq: Two times a day (BID) | ORAL | Status: DC
  Administered 2011-01-02 – 2011-01-03 (×3): 300 mg via ORAL
  Filled 2011-01-02 (×5): qty 1

## 2011-01-02 MED ORDER — PREDNISONE 20 MG PO TABS
30.0000 mg | ORAL_TABLET | Freq: Every day | ORAL | Status: AC
  Administered 2011-01-02: 30 mg via ORAL
  Filled 2011-01-02: qty 1

## 2011-01-02 MED ORDER — ONE-DAILY MULTI VITAMINS PO TABS: 1.0000 | ORAL_TABLET | Freq: Every day | ORAL | Status: DC

## 2011-01-02 MED ORDER — ONDANSETRON HCL 4 MG PO TABS: 4.0000 mg | ORAL_TABLET | Freq: Four times a day (QID) | ORAL | Status: DC | PRN

## 2011-01-02 MED ORDER — PREDNISONE 10 MG PO TABS
10.0000 mg | ORAL_TABLET | Freq: Every day | ORAL | Status: DC
  Filled 2011-01-02: qty 1

## 2011-01-02 MED ORDER — MOXIFLOXACIN HCL IN NACL 400 MG/250ML IV SOLN
400.0000 mg | INTRAVENOUS | Status: DC
  Filled 2011-01-02: qty 250

## 2011-01-02 MED ORDER — LEVOFLOXACIN IN D5W 750 MG/150ML IV SOLN
750.0000 mg | INTRAVENOUS | Status: DC
  Administered 2011-01-02: 750 mg via INTRAVENOUS
  Filled 2011-01-02: qty 150

## 2011-01-02 NOTE — Progress Notes (Signed)
*  PRELIMINARY RESULTS*  Bilateral Lower Extremity Venous has been performed. Preliminary - No obvious evidence of deep vein thrombosis involving the right or left lower extremity. No evidence of Baker's cyst bilaterally.  [name] 01/02/2011, 4:22 PM

## 2011-01-02 NOTE — Progress Notes (Signed)
01/02/2011 [facility] Case Management Note [phone]       Utilization review completed.  

## 2011-01-02 NOTE — Progress Notes (Addendum)
ANTIBIOTIC CONSULT NOTE - INITIAL  Pharmacy Consult for levaquin Indication: bronchitis/PNA  Allergies  Allergen Reactions  . Epinephrine Other (See Comments)    Patient thinks her heart stopped and the MD said never to take this again.  Berle Mull Dye (Iodinated Diagnostic Agents) Other (See Comments)    Unknown-patient said she thinks her heart stopped, but she really can't remember    Patient Measurements: Height: 5\' 2"  (157.5 cm) Weight: 101 lb 11.2 oz (46.131 kg) IBW/kg (Calculated) : 50.1   Vital Signs: Temp: 98 F (36.7 C) (11/19 0500) Temp src: Oral (11/19 0500) BP: 114/54 mmHg (11/19 0500) Pulse Rate: 99  (11/19 0500)  Labs:  Basename 01/02/11 0526 01/01/11 1633  WBC 6.1 4.9  HGB 11.5* 12.4  PLT 267 290  LABCREA -- --  CREATININE 0.54 0.71   Estimated Creatinine Clearance: 42.9 ml/min (by C-G formula based on Cr of 0.54).   Microbiology:  Medical History: Past Medical History  Diagnosis Date  . Allergic rhinitis   . Diabetes mellitus   . Supraventricular tachycardia     s/p RF ablation in 2000  . COPD (chronic obstructive pulmonary disease)   . DVT (deep venous thrombosis)   . Syncope     St. Jude loop recorder implantation 03/2009  . LV dysfunction     Mild, EF 45-50% by echocardiogram 02/2009  . Shortness of breath   . Asthma   . Hypertension   . Headache   . Anxiety     Medications:  Prescriptions prior to admission  Medication Sig Dispense Refill  . acetaminophen (TYLENOL) 500 MG tablet Take 500 mg by mouth as needed. For pain      . albuterol (PROAIR HFA) 108 (90 BASE) MCG/ACT inhaler For shortness of breath or wheeze      . albuterol (PROVENTIL) (2.5 MG/3ML) 0.083% nebulizer solution Take 2.5 mg by nebulization every 6 (six) hours as needed. For shortness of breath or wheeze      . aspirin 81 MG tablet Take 81 mg by mouth daily.       . Cholecalciferol (VITAMIN D3) 50000 UNITS CAPS Take 50,000 Units by mouth once a week. On Sundays      .  diltiazem (CARDIZEM CD) 180 MG 24 hr capsule Take 1 capsule (180 mg total) by mouth daily.  30 capsule  6  . docusate sodium (COLACE) 100 MG capsule Take 100 mg by mouth 2 (two) times daily.        Marland Kitchen gabapentin (NEURONTIN) 300 MG capsule Take 1 capsule (300 mg total) by mouth 2 (two) times daily.  60 capsule  0  . montelukast (SINGULAIR) 10 MG tablet TAKE 1 TABLET BY MOUTH ONCE A DAY  30 tablet  6  . Multiple Vitamin (MULTIVITAMIN) tablet Take 1 tablet by mouth daily.       . pravastatin (PRAVACHOL) 40 MG tablet Take 40 mg by mouth daily.        . rosuvastatin (CRESTOR) 5 MG tablet Take 5 mg by mouth daily.        Marland Kitchen SPIRIVA HANDIHALER 18 MCG inhalation capsule INHALE 1 CAPSULE DAILY *DO NOT SWALLOW CAPSULE*  1 each  1     Medications:  Scheduled:    . sodium chloride   Intravenous Once  . albuterol  5 mg Nebulization Once  . albuterol  5 mg Nebulization Once  . aspirin  324 mg Oral Once  . aspirin  81 mg Oral Daily  . budesonide  0.5  mg Nebulization BID  . diltiazem  180 mg Oral Daily  . docusate sodium  100 mg Oral BID  . gabapentin  300 mg Oral BID  . insulin aspart  0-9 Units Subcutaneous TID WC  . ipratropium  0.5 mg Nebulization Once  . levalbuterol  0.63 mg Nebulization Q6H  . montelukast  10 mg Oral QHS  . moxifloxacin  400 mg Intravenous Once  . multivitamins ther. w/minerals  1 tablet Oral Daily  . ondansetron  4 mg Intravenous Once  . predniSONE  30 mg Oral QAC breakfast   Followed by  . predniSONE  20 mg Oral QAC breakfast   Followed by  . predniSONE  10 mg Oral QAC breakfast   Followed by  . predniSONE  5 mg Oral QAC breakfast  . predniSONE  40 mg Oral Once  . rosuvastatin  5 mg Oral Daily  . sodium chloride  3 mL Intravenous Q12H  . tiotropium  18 mcg Inhalation Daily  . DISCONTD: azithromycin  500 mg Intravenous Once  . DISCONTD: cefTRIAXone (ROCEPHIN) IV  1 g Intravenous Once  . DISCONTD: moxifloxacin  400 mg Intravenous Q24H  . DISCONTD: multivitamin  1  tablet Oral Daily   Assessment: 77 yo female with SOB due to COPD and to start on levaquin (patient received avelox 400mg  IV on 01/02/11).   Plan:  Will give levaquin 750mg  IV q48hr and follow renal function.  Benny Lennert 01/02/2011,1:20 PM

## 2011-01-02 NOTE — ED Notes (Signed)
Report given to [facility] on floor prior to move to CT then room.

## 2011-01-02 NOTE — Progress Notes (Signed)
Subjective: Breathing better today.  Feels better overall.  Denies any chest pain.  Reports that her breathing is improved.  Objective: Vital signs in last 24 hours: Filed Vitals:   01/02/11 0115 01/02/11 0300 01/02/11 0500 01/02/11 0738  BP: 118/63  114/54   Pulse: 84  99   Temp: 98 F (36.7 C)  98 F (36.7 C)   TempSrc: Oral  Oral   Resp: 18  18   Height: 5\' 2"  (1.575 m)     Weight: 46.131 kg (101 lb 11.2 oz)     SpO2: 94% 96% 98% 95%   Weight change:   Intake/Output Summary (Last 24 hours) at 01/02/11 1243 Last data filed at 01/02/11 0900  Gross per 24 hour  Intake    240 ml  Output      0 ml  Net    240 ml   Physical Exam: General: Awake, Oriented, No acute distress. HEENT: EOMI. Neck: Supple CV: S1 and S2 Lungs: Clear to ascultation bilaterally, no wheezing auscultated bilaterally. Abdomen: Soft, Nontender, Nondistended, +bowel sounds. Ext: Good pulses. Trace edema.  Lab Results:  [facility] 01/02/11 0526 01/01/11 1633  NA 130* 132*  K 4.5 4.5  CL 93* 95*  CO2 27 29  GLUCOSE 204* 160*  BUN 12 16  CREATININE 0.54 0.71  CALCIUM 8.9 9.1  MG 1.8 --  PHOS -- --    Basename 01/02/11 0526  AST 17  ALT 23  ALKPHOS 69  BILITOT 0.1*  PROT 6.2  ALBUMIN 2.9*   No results found for this basename: LIPASE:2,AMYLASE:2 in the last 72 hours  Basename 01/02/11 0526 01/01/11 1633  WBC 6.1 4.9  NEUTROABS -- 3.3  HGB 11.5* 12.4  HCT 34.6* 37.5  MCV 97.7 98.7  PLT 267 290    Basename 01/02/11 1030 01/02/11 0212  CKTOTAL 37 35  CKMB 3.7 4.0  CKMBINDEX -- --  TROPONINI <0.30 <0.30    Basename 01/02/11 0211  POCBNP 321.0    Basename 01/02/11 0212  DDIMER 2.11*   No results found for this basename: HGBA1C:2 in the last 72 hours No results found for this basename: CHOL:2,HDL:2,LDLCALC:2,TRIG:2,CHOLHDL:2,LDLDIRECT:2 in the last 72 hours No results found for this basename: TSH,T4TOTAL,FREET3,T3FREE,THYROIDAB in the last 72 hours No results found for this  basename: VITAMINB12:2,FOLATE:2,FERRITIN:2,TIBC:2,IRON:2,RETICCTPCT:2 in the last 72 hours  Micro Results: No results found for this or any previous visit (from the past 240 hour(s)).  Studies/Results: Dg Chest 2 View  01/01/2011  *RADIOLOGY REPORT*  Clinical Data: Cough.  Shortness of breath.  Diabetes.  CHEST - 2 VIEW  Comparison: 12/23/2010  Findings: A left-sided loop recorder is present.  Heart size is within normal limits.  Peripheral density the right lung base appears increased compared to 12/21/2010, and could represent atelectasis or pneumonia.  There is also some increase in density in the right midlung underlying the known scarring in this vicinity.  IMPRESSION:  1.  Mildly increased peripheral density at the right lung base and in the right midlung, potentially representing atelectasis or pneumonia superimposed on the chronic nodularity and scarring. 2.  Loop recorder noted.  Original Report Authenticated By: Dellia Cloud, M.D.   Dg Chest 2 View  12/23/2010  *RADIOLOGY REPORT*  Clinical Data: Mid chest pain, weakness, nausea, dry heaves, headache, hypertension, diabetes  CHEST - 2 VIEW  Comparison: 12/21/2010  Findings: Loop recorder projects over left mid chest. Upper-normal size of cardiac silhouette. Mediastinal contours and pulmonary vascular markings normal. Minimal prominence of right hilum stable,  question due to prominent central pulmonary artery. Emphysematous and bronchitic changes. No pulmonary infiltrate, pleural effusion or pneumothorax. Bones diffusely demineralized. Old healed fracture middle third left clavicle.  IMPRESSION: Emphysematous and chronic bronchitic changes. No acute abnormalities.  Original Report Authenticated By: Lollie Marrow, M.D.   Dg Chest 2 View  12/21/2010  *RADIOLOGY REPORT*  Clinical Data: Cough.  CHEST - 2 VIEW  Comparison: Chest x-ray 12/16/2010.  Findings: The loop recorder is stable.  The cardiac silhouette, mediastinal and hilar contours  are within normal limits and unchanged.  There are chronic emphysematous and pulmonary scarring changes.  No definite acute overlying pulmonary process.  Stable right lower lobe lung density is likely chondral calcification. The bony thorax is intact and appears stable.  IMPRESSION: Chronic emphysematous changes and pulmonary scarring without acute overlying pulmonary process.  Original Report Authenticated By: P. Loralie Champagne, M.D.   Dg Chest 2 View  12/16/2010  *RADIOLOGY REPORT*  Clinical Data: Chest pain, headache.  CHEST - 2 VIEW  Comparison: 10/19/2010  Findings: There is hyperinflation of the lungs compatible with COPD.  Heart and mediastinal contours are within normal limits.  No focal opacities or effusions.  No acute bony abnormality.  IMPRESSION: COPD.  No active disease.  Original Report Authenticated By: Cyndie Chime, M.D.   Ct Head Wo Contrast  01/02/2011  *RADIOLOGY REPORT*  Clinical Data: Severe headache, confusion.  CT HEAD WITHOUT CONTRAST  Technique:  Contiguous axial images were obtained from the base of the skull through the vertex without contrast.  Comparison: 12/23/2010  Findings: Periventricular and subcortical white matter hypodensities are most in keeping with chronic microangiopathic change. There is no evidence for acute hemorrhage, hydrocephalus, mass lesion, or abnormal extra-axial fluid collection.  No definite CT evidence for acute infarction.  Paranasal sinus opacification and mucosal thickening with high attenuation which may represent blood products, fungal colonization, or inspissated secretions. Left lens replacement.  IMPRESSION: Mild white matter hypodensities, may reflect chronic microangiopathic change.  No definite acute intracranial abnormality.  Paranasal sinus opacification is similar to prior studies. High attenuation within the sinuses may represent blood products, fungal colonization, or inspissated secretions.  Original Report Authenticated By: Waneta Martins, M.D.   Ct Head Wo Contrast  12/23/2010  *RADIOLOGY REPORT*  Clinical Data: Severe headache  CT HEAD WITHOUT CONTRAST  Technique:  Contiguous axial images were obtained from the base of the skull through the vertex without contrast.  Comparison: 03/08/2009  Findings: No evidence of parenchymal hemorrhage or extra-axial fluid collection. No mass lesion, mass effect, or midline shift.  No CT evidence of acute infarction.  Subcortical white matter and periventricular small vessel ischemic changes.  Age related atrophy.  Near complete opacification of the right maxillary, right sphenoid, and bilateral ethmoid sinuses.  Partial opacification/fluid level in the left maxillary sinus.  Mucosal thickening in the left sphenoid sinus.  This appearance is grossly unchanged from prior studies.  No evidence of calvarial fracture.  IMPRESSION: No evidence of acute intracranial abnormality.  Age-related atrophy with small vessel ischemic changes  Chronic paranasal sinus opacification, as above.  Original Report Authenticated By: Charline Bills, M.D.   Nm Pulmonary Per & Vent  01/02/2011  *RADIOLOGY REPORT*  Clinical Data:  History of shortness of breath and positive D-dimer value.  NUCLEAR MEDICINE VENTILATION - PERFUSION LUNG SCAN  Technique:  Wash-in, equilibrium, and wash-out phase ventilation images were obtained using Xe-133 gas.  Perfusion images were obtained in multiple projections after intravenous injection of Tc-  16m MAA.  Radiopharmaceuticals:  10 mCi Xe-133 gas and  6 mCi Tc-12m MAA.  Comparison:  Chest radiograph examination 01/01/2011.  Findings:  Ventilation study: On the breath-holding and equilibrium phases no lobar or segmental defect is evident.  There is diffuse retention of xenon gas on the washout phase consistent with air trapping and obstructive pulmonary disease.  Perfusion study:  There is expected distribution of the MAA particles.  No lobar or segmental defect is evident.  No  ventilation perfusion mismatch is seen.  IMPRESSION: No segmental or lobar ventilation or perfusion defects are seen. No ventilation perfusion mismatch is evident.  Examination is low probability for pulmonary embolism.  There is diffuse retention of xenon gas on the washout phase consistent with air trapping and obstructive pulmonary disease.  Original Report Authenticated By: Crawford Givens, M.D.    Medications: I have reviewed the patient's current medications. Scheduled Meds:   . sodium chloride   Intravenous Once  . albuterol  5 mg Nebulization Once  . albuterol  5 mg Nebulization Once  . aspirin  324 mg Oral Once  . aspirin  81 mg Oral Daily  . budesonide  0.5 mg Nebulization BID  . diltiazem  180 mg Oral Daily  . docusate sodium  100 mg Oral BID  . gabapentin  300 mg Oral BID  . insulin aspart  0-9 Units Subcutaneous TID WC  . ipratropium  0.5 mg Nebulization Once  . levalbuterol  0.63 mg Nebulization Q6H  . montelukast  10 mg Oral QHS  . moxifloxacin  400 mg Intravenous Once  . multivitamins ther. w/minerals  1 tablet Oral Daily  . ondansetron  4 mg Intravenous Once  . predniSONE  40 mg Oral Once  . rosuvastatin  5 mg Oral Daily  . sodium chloride  3 mL Intravenous Q12H  . tiotropium  18 mcg Inhalation Daily  . DISCONTD: azithromycin  500 mg Intravenous Once  . DISCONTD: cefTRIAXone (ROCEPHIN) IV  1 g Intravenous Once  . DISCONTD: moxifloxacin  400 mg Intravenous Q24H  . DISCONTD: multivitamin  1 tablet Oral Daily   Continuous Infusions:  PRN Meds:.acetaminophen, acetaminophen, albuterol, ALPRAZolam, levalbuterol, ondansetron (ZOFRAN) IV, ondansetron, technetium albumin aggregated, xenon xe 133, DISCONTD: ALPRAZolam  Assessment/Plan: 1. Shortness of breath/COPD bronchitis.  Likely due to COPD exacerbation.  Breathing is improved on steroids and neb treatments.  Continue antibiotic as the patient has cough productive sputum.  Antibiotics since 01/01/2011.  VQ scan indicates low  probability for pulmonary embolism.  2. CHEST PAIN.  Patient has been ruled out for acute coronary syndrome with negative troponins.  3.  Acute delirium/altered mental status.  Likely secondary to #1.  She reports that her mentation is improved.  Head CT doesn't show any acute abnormality.  Urine drug screen is negative.  TSH, vitamin B 12, folate, and RPR is pending.  4. Recent history of SVT (supraventricular tachycardia).  Patient was recently discharged from cardiology service on 12/27/2010.  5.  Type 2 diabetes.  Continue insulin.  Blood sugar elevated due to steroids.  6.  History of DVT.  Not an active issue at this time.  7.  History of LV dysfunction stable  8.  Prophylaxis.  SCDs.  9.  Disposition.  Pending PT and OT evaluation.   LOS: 1 day  Talbert Trembath A, MD 01/02/2011, 12:43 PM

## 2011-01-03 LAB — CBC
MCH: 32.5 pg (ref 26.0–34.0)
MCV: 98.9 fL (ref 78.0–100.0)
Platelets: 289 10*3/uL (ref 150–400)
RBC: 3.51 MIL/uL — ABNORMAL LOW (ref 3.87–5.11)

## 2011-01-03 LAB — BASIC METABOLIC PANEL
BUN: 16 mg/dL (ref 6–23)
CO2: 32 mEq/L (ref 19–32)
Calcium: 9.2 mg/dL (ref 8.4–10.5)
Creatinine, Ser: 0.62 mg/dL (ref 0.50–1.10)

## 2011-01-03 LAB — GLUCOSE, CAPILLARY
Glucose-Capillary: 141 mg/dL — ABNORMAL HIGH (ref 70–99)
Glucose-Capillary: 142 mg/dL — ABNORMAL HIGH (ref 70–99)

## 2011-01-03 LAB — FOLATE RBC: RBC Folate: 750 ng/mL — ABNORMAL HIGH (ref >=366)

## 2011-01-03 MED ORDER — LEVOFLOXACIN 500 MG PO TABS: 500.0000 mg | ORAL_TABLET | Freq: Every day | ORAL | Status: AC

## 2011-01-03 MED ORDER — BUDESONIDE 0.5 MG/2ML IN SUSP: 0.5000 mg | Freq: Two times a day (BID) | RESPIRATORY_TRACT | Status: DC

## 2011-01-03 MED ORDER — PREDNISONE 10 MG PO TABS: 10.0000 mg | ORAL_TABLET | Freq: Every day | ORAL | Status: DC
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
## 2011-01-03 NOTE — Progress Notes (Signed)
Spoke with patient about outpatient PT. Gave her list of outpatient locations available in [place] and contact information. Explained to call for appt and take her Rx for outpatient PT to the appt. Patient stated that she understood and would f/u.

## 2011-01-03 NOTE — Discharge Summary (Signed)
Discharge Summary  [name] MR#: [id number]  DOB:Oct 14, 1931  Date of Admission: 01/01/2011 Date of Discharge: 01/03/2011  Patient's PCP: Candi Leash, MD  Attending Physician:Bennie Chirico A  Consults:   None  Discharge Diagnoses: Principal Problem:  *COPD bronchitis Active Problems:  CHEST PAIN  SVT (supraventricular tachycardia)  LV dysfunction  Confusion  Diabetes mellitus  Generalized weakness  Brief Admitting History and Physical On admission: "77 year old emale with history of copd, SVT S/P ablation presents to the ER with increasing difficulty breathing and coughing. Patient did experience some chest pain which was retrosternal and lasted for an hour was pressure like got better after she got to ER. In addition patient has been getting increasingly confused and at times hallucinating. Patient denies any palpitations focal deficits abdominal pain nausea vomiting fever or chills.Patient was admitted last week for chest pain and shortness of breath and was discharged after observation."  Discharge Medications Current Discharge Medication List    START taking these medications   Details  budesonide (PULMICORT) 0.5 MG/2ML nebulizer solution Take 2 mLs (0.5 mg total) by nebulization 2 (two) times daily. Qty: 120 mL, Refills: 1    levofloxacin (LEVAQUIN) 500 MG tablet Take 1 tablet (500 mg total) by mouth daily. Qty: 5 tablet, Refills: 0    predniSONE (DELTASONE) 10 MG tablet Take 1 tablet (10 mg total) by mouth daily before breakfast. Take 10 mg po qday x2 days then 5 mg po qday x2 days then discontinue. Qty: 3 tablet, Refills: 0      CONTINUE these medications which have NOT CHANGED   Details  acetaminophen (TYLENOL) 500 MG tablet Take 500 mg by mouth as needed. For pain    albuterol (PROAIR HFA) 108 (90 BASE) MCG/ACT inhaler For shortness of breath or wheeze    albuterol (PROVENTIL) (2.5 MG/3ML) 0.083% nebulizer solution Take 2.5 mg by nebulization every 6  (six) hours as needed. For shortness of breath or wheeze    aspirin 81 MG tablet Take 81 mg by mouth daily.     Cholecalciferol (VITAMIN D3) 50000 UNITS CAPS Take 50,000 Units by mouth once a week. On Sundays    diltiazem (CARDIZEM CD) 180 MG 24 hr capsule Take 1 capsule (180 mg total) by mouth daily. Qty: 30 capsule, Refills: 6    docusate sodium (COLACE) 100 MG capsule Take 100 mg by mouth 2 (two) times daily.      gabapentin (NEURONTIN) 300 MG capsule Take 1 capsule (300 mg total) by mouth 2 (two) times daily. Qty: 60 capsule, Refills: 0   Comments: Follow up with your primary care doctor for your itching/nerve pain    montelukast (SINGULAIR) 10 MG tablet TAKE 1 TABLET BY MOUTH ONCE A DAY Qty: 30 tablet, Refills: 6    Multiple Vitamin (MULTIVITAMIN) tablet Take 1 tablet by mouth daily.     pravastatin (PRAVACHOL) 40 MG tablet Take 40 mg by mouth daily.      rosuvastatin (CRESTOR) 5 MG tablet Take 5 mg by mouth daily.      SPIRIVA HANDIHALER 18 MCG inhalation capsule INHALE 1 CAPSULE DAILY *DO NOT SWALLOW CAPSULE* Qty: 1 each, Refills: 1      STOP taking these medications     ALPRAZolam (NIRAVAM) 0.25 MG dissolvable tablet Comments:  Reason for Stopping:          Hospital Course: COPD bronchitis Present on Admission:  .COPD bronchitis .CHEST PAIN .LV dysfunction .Confusion .SVT (supraventricular tachycardia) .Diabetes mellitus  1. Shortness of breath/COPD bronchitis. Likely  due to COPD exacerbation. Breathing is improved on steroids and neb treatments. Continue antibiotic as the patient has cough productive sputum. Antibiotics since 01/01/2011. VQ scan indicates low probability for pulmonary embolism.  Continue steroid taper as outpatient.  2. CHEST PAIN. Patient has been ruled out for acute coronary syndrome with negative troponins.  No events on telemetry.  Patient has a followup appointment with Dr. Ladona Ridgel cardiology and 01/12/2011.  3. Acute delirium/altered  mental status. Likely secondary to #1. She reports that her mentation is improved. Head CT doesn't show any acute abnormality. Urine drug screen is negative. TSH normal, vitamin B 12 normal, folate pending, and RPR nonreactive.   4. Recent history of SVT (supraventricular tachycardia). Patient was recently discharged from cardiology service on 12/27/2010. Patient is rate controlled during the course of hospital stay.  Patient has a cardiology follow up in one week.  5. Type 2 diabetes. Continue insulin. Blood sugar elevated due to steroids.  Stable.  Patient not on any diabetic medications prior to admission.  Blood sugars are improved as steroid dose was decreased.  Patient will not be on any diabetic medications after discharge.  Hemoglobin A1c on 01/02/2011 was 6.3 which indicates an average blood sugar of 134.  6. History of DVT. Bilateral lower extremity Dopplers on 01/02/2003 did not show any DVTs.   7. History of LV dysfunction stable   Day of Discharge BP 114/63  Pulse 77  Temp(Src) 97.1 F (36.2 C) (Oral)  Resp 16  Ht 5\' 2"  (1.575 m)  Wt 48.852 kg (107 lb 11.2 oz)  BMI 19.70 kg/m2  SpO2 95%  Results for orders placed during the hospital encounter of 01/01/11 (from the past 48 hour(s))  CBC     Status: Abnormal   Collection Time   01/01/11  4:33 PM      Component Value Range Comment   WBC 4.9  4.0 - 10.5 (K/uL)    RBC 3.80 (*) 3.87 - 5.11 (MIL/uL)    Hemoglobin 12.4  12.0 - 15.0 (g/dL)    HCT 14.7  82.9 - 56.2 (%)    MCV 98.7  78.0 - 100.0 (fL)    MCH 32.6  26.0 - 34.0 (pg)    MCHC 33.1  30.0 - 36.0 (g/dL)    RDW 13.0  86.5 - 78.4 (%)    Platelets 290  150 - 400 (K/uL)   DIFFERENTIAL     Status: Normal   Collection Time   01/01/11  4:33 PM      Component Value Range Comment   Neutrophils Relative 68  43 - 77 (%)    Neutro Abs 3.3  1.7 - 7.7 (K/uL)    Lymphocytes Relative 23  12 - 46 (%)    Lymphs Abs 1.1  0.7 - 4.0 (K/uL)    Monocytes Relative 8  3 - 12 (%)     Monocytes Absolute 0.4  0.1 - 1.0 (K/uL)    Eosinophils Relative 1  0 - 5 (%)    Eosinophils Absolute 0.1  0.0 - 0.7 (K/uL)    Basophils Relative 0  0 - 1 (%)    Basophils Absolute 0.0  0.0 - 0.1 (K/uL)   BASIC METABOLIC PANEL     Status: Abnormal   Collection Time   01/01/11  4:33 PM      Component Value Range Comment   Sodium 132 (*) 135 - 145 (mEq/L)    Potassium 4.5  3.5 - 5.1 (mEq/L)    Chloride 95 (*)  96 - 112 (mEq/L)    CO2 29  19 - 32 (mEq/L)    Glucose, Bld 160 (*) 70 - 99 (mg/dL)    BUN 16  6 - 23 (mg/dL)    Creatinine, Ser 1.61  0.50 - 1.10 (mg/dL)    Calcium 9.1  8.4 - 10.5 (mg/dL)    GFR calc non Af Amer 81 (*) >90 (mL/min)    GFR calc Af Amer >90  >90 (mL/min)   POCT I-STAT TROPONIN I     Status: Normal   Collection Time   01/01/11  4:41 PM      Component Value Range Comment   Troponin i, poc 0.00  0.00 - 0.08 (ng/mL)    Comment 3            POCT I-STAT TROPONIN I     Status: Normal   Collection Time   01/01/11  9:04 PM      Component Value Range Comment   Troponin i, poc 0.00  0.00 - 0.08 (ng/mL)    Comment 3            PRO B NATRIURETIC PEPTIDE     Status: Normal   Collection Time   01/02/11  2:11 AM      Component Value Range Comment   BNP, POC 321.0  0 - 450 (pg/mL)   HEMOGLOBIN A1C     Status: Abnormal   Collection Time   01/02/11  2:12 AM      Component Value Range Comment   Hemoglobin A1C 6.3 (*) <5.7 (%)    Mean Plasma Glucose 134 (*) <117 (mg/dL)   CARDIAC PANEL(CRET KIN+CKTOT+MB+TROPI)     Status: Normal   Collection Time   01/02/11  2:12 AM      Component Value Range Comment   Total CK 35  7 - 177 (U/L)    CK, MB 4.0  0.3 - 4.0 (ng/mL)    Troponin I <0.30  <0.30 (ng/mL)    Relative Index RELATIVE INDEX IS INVALID  0.0 - 2.5    D-DIMER, QUANTITATIVE     Status: Abnormal   Collection Time   01/02/11  2:12 AM      Component Value Range Comment   D-Dimer, Quant 2.11 (*) 0.00 - 0.48 (ug/mL-FEU)   AMMONIA     Status: Normal   Collection Time     01/02/11  2:16 AM      Component Value Range Comment   Ammonia 17  11 - 60 (umol/L)   URINE RAPID DRUG SCREEN (HOSP PERFORMED)     Status: Normal   Collection Time   01/02/11  2:32 AM      Component Value Range Comment   Opiates NONE DETECTED  NONE DETECTED     Cocaine NONE DETECTED  NONE DETECTED     Benzodiazepines NONE DETECTED  NONE DETECTED     Amphetamines NONE DETECTED  NONE DETECTED     Tetrahydrocannabinol NONE DETECTED  NONE DETECTED     Barbiturates NONE DETECTED  NONE DETECTED    GLUCOSE, CAPILLARY     Status: Abnormal   Collection Time   01/02/11  2:58 AM      Component Value Range Comment   Glucose-Capillary 183 (*) 70 - 99 (mg/dL)   CBC     Status: Abnormal   Collection Time   01/02/11  5:26 AM      Component Value Range Comment   WBC 6.1  4.0 - 10.5 (K/uL)  RBC 3.54 (*) 3.87 - 5.11 (MIL/uL)    Hemoglobin 11.5 (*) 12.0 - 15.0 (g/dL)    HCT 98.1 (*) 19.1 - 46.0 (%)    MCV 97.7  78.0 - 100.0 (fL)    MCH 32.5  26.0 - 34.0 (pg)    MCHC 33.2  30.0 - 36.0 (g/dL)    RDW 47.8  29.5 - 62.1 (%)    Platelets 267  150 - 400 (K/uL)   COMPREHENSIVE METABOLIC PANEL     Status: Abnormal   Collection Time   01/02/11  5:26 AM      Component Value Range Comment   Sodium 130 (*) 135 - 145 (mEq/L)    Potassium 4.5  3.5 - 5.1 (mEq/L)    Chloride 93 (*) 96 - 112 (mEq/L)    CO2 27  19 - 32 (mEq/L)    Glucose, Bld 204 (*) 70 - 99 (mg/dL)    BUN 12  6 - 23 (mg/dL)    Creatinine, Ser 3.08  0.50 - 1.10 (mg/dL)    Calcium 8.9  8.4 - 10.5 (mg/dL)    Total Protein 6.2  6.0 - 8.3 (g/dL)    Albumin 2.9 (*) 3.5 - 5.2 (g/dL)    AST 17  0 - 37 (U/L)    ALT 23  0 - 35 (U/L)    Alkaline Phosphatase 69  39 - 117 (U/L)    Total Bilirubin 0.1 (*) 0.3 - 1.2 (mg/dL)    GFR calc non Af Amer 89 (*) >90 (mL/min)    GFR calc Af Amer >90  >90 (mL/min)   MAGNESIUM     Status: Normal   Collection Time   01/02/11  5:26 AM      Component Value Range Comment   Magnesium 1.8  1.5 - 2.5 (mg/dL)    TSH     Status: Normal   Collection Time   01/02/11  5:26 AM      Component Value Range Comment   TSH 0.408  0.350 - 4.500 (uIU/mL)   RPR     Status: Normal   Collection Time   01/02/11  5:26 AM      Component Value Range Comment   RPR NON REACTIVE  NON REACTIVE    VITAMIN B12     Status: Normal   Collection Time   01/02/11  5:26 AM      Component Value Range Comment   Vitamin B-12 670  211 - 911 (pg/mL)   GLUCOSE, CAPILLARY     Status: Abnormal   Collection Time   01/02/11  7:36 AM      Component Value Range Comment   Glucose-Capillary 222 (*) 70 - 99 (mg/dL)   CARDIAC PANEL(CRET KIN+CKTOT+MB+TROPI)     Status: Normal   Collection Time   01/02/11 10:30 AM      Component Value Range Comment   Total CK 37  7 - 177 (U/L)    CK, MB 3.7  0.3 - 4.0 (ng/mL)    Troponin I <0.30  <0.30 (ng/mL)    Relative Index RELATIVE INDEX IS INVALID  0.0 - 2.5    GLUCOSE, CAPILLARY     Status: Abnormal   Collection Time   01/02/11 12:04 PM      Component Value Range Comment   Glucose-Capillary 229 (*) 70 - 99 (mg/dL)   GLUCOSE, CAPILLARY     Status: Abnormal   Collection Time   01/02/11  4:42 PM      Component Value  Range Comment   Glucose-Capillary 157 (*) 70 - 99 (mg/dL)   CARDIAC PANEL(CRET KIN+CKTOT+MB+TROPI)     Status: Normal   Collection Time   01/02/11  4:54 PM      Component Value Range Comment   Total CK 31  7 - 177 (U/L)    CK, MB 3.3  0.3 - 4.0 (ng/mL)    Troponin I <0.30  <0.30 (ng/mL)    Relative Index RELATIVE INDEX IS INVALID  0.0 - 2.5    GLUCOSE, CAPILLARY     Status: Abnormal   Collection Time   01/02/11  5:07 PM      Component Value Range Comment   Glucose-Capillary 136 (*) 70 - 99 (mg/dL)   GLUCOSE, CAPILLARY     Status: Abnormal   Collection Time   01/02/11  9:05 PM      Component Value Range Comment   Glucose-Capillary 210 (*) 70 - 99 (mg/dL)    Comment 1 Notify RN     CBC     Status: Abnormal   Collection Time   01/03/11  5:50 AM      Component Value  Range Comment   WBC 7.4  4.0 - 10.5 (K/uL)    RBC 3.51 (*) 3.87 - 5.11 (MIL/uL)    Hemoglobin 11.4 (*) 12.0 - 15.0 (g/dL)    HCT 01.0 (*) 27.2 - 46.0 (%)    MCV 98.9  78.0 - 100.0 (fL)    MCH 32.5  26.0 - 34.0 (pg)    MCHC 32.9  30.0 - 36.0 (g/dL)    RDW 53.6  64.4 - 03.4 (%)    Platelets 289  150 - 400 (K/uL)   BASIC METABOLIC PANEL     Status: Abnormal   Collection Time   01/03/11  5:50 AM      Component Value Range Comment   Sodium 135  135 - 145 (mEq/L)    Potassium 4.8  3.5 - 5.1 (mEq/L)    Chloride 98  96 - 112 (mEq/L)    CO2 32  19 - 32 (mEq/L)    Glucose, Bld 148 (*) 70 - 99 (mg/dL)    BUN 16  6 - 23 (mg/dL)    Creatinine, Ser 7.42  0.50 - 1.10 (mg/dL)    Calcium 9.2  8.4 - 10.5 (mg/dL)    GFR calc non Af Amer 85 (*) >90 (mL/min)    GFR calc Af Amer >90  >90 (mL/min)   GLUCOSE, CAPILLARY     Status: Abnormal   Collection Time   01/03/11  7:33 AM      Component Value Range Comment   Glucose-Capillary 141 (*) 70 - 99 (mg/dL)   GLUCOSE, CAPILLARY     Status: Abnormal   Collection Time   01/03/11 11:39 AM      Component Value Range Comment   Glucose-Capillary 142 (*) 70 - 99 (mg/dL)     Dg Chest 2 View  59/56/3875  *RADIOLOGY REPORT*  Clinical Data: Cough.  Shortness of breath.  Diabetes.  CHEST - 2 VIEW  Comparison: 12/23/2010  Findings: A left-sided loop recorder is present.  Heart size is within normal limits.  Peripheral density the right lung base appears increased compared to 12/21/2010, and could represent atelectasis or pneumonia.  There is also some increase in density in the right midlung underlying the known scarring in this vicinity.  IMPRESSION:  1.  Mildly increased peripheral density at the right lung base and in the right midlung, potentially  representing atelectasis or pneumonia superimposed on the chronic nodularity and scarring. 2.  Loop recorder noted.  Original Report Authenticated By: Dellia Cloud, M.D.   Dg Chest 2 View  12/23/2010   *RADIOLOGY REPORT*  Clinical Data: Mid chest pain, weakness, nausea, dry heaves, headache, hypertension, diabetes  CHEST - 2 VIEW  Comparison: 12/21/2010  Findings: Loop recorder projects over left mid chest. Upper-normal size of cardiac silhouette. Mediastinal contours and pulmonary vascular markings normal. Minimal prominence of right hilum stable, question due to prominent central pulmonary artery. Emphysematous and bronchitic changes. No pulmonary infiltrate, pleural effusion or pneumothorax. Bones diffusely demineralized. Old healed fracture middle third left clavicle.  IMPRESSION: Emphysematous and chronic bronchitic changes. No acute abnormalities.  Original Report Authenticated By: Lollie Marrow, M.D.   Dg Chest 2 View  12/21/2010  *RADIOLOGY REPORT*  Clinical Data: Cough.  CHEST - 2 VIEW  Comparison: Chest x-ray 12/16/2010.  Findings: The loop recorder is stable.  The cardiac silhouette, mediastinal and hilar contours are within normal limits and unchanged.  There are chronic emphysematous and pulmonary scarring changes.  No definite acute overlying pulmonary process.  Stable right lower lobe lung density is likely chondral calcification. The bony thorax is intact and appears stable.  IMPRESSION: Chronic emphysematous changes and pulmonary scarring without acute overlying pulmonary process.  Original Report Authenticated By: P. Loralie Champagne, M.D.   Dg Chest 2 View  12/16/2010  *RADIOLOGY REPORT*  Clinical Data: Chest pain, headache.  CHEST - 2 VIEW  Comparison: 10/19/2010  Findings: There is hyperinflation of the lungs compatible with COPD.  Heart and mediastinal contours are within normal limits.  No focal opacities or effusions.  No acute bony abnormality.  IMPRESSION: COPD.  No active disease.  Original Report Authenticated By: Cyndie Chime, M.D.   Ct Head Wo Contrast  01/02/2011  *RADIOLOGY REPORT*  Clinical Data: Severe headache, confusion.  CT HEAD WITHOUT CONTRAST  Technique:   Contiguous axial images were obtained from the base of the skull through the vertex without contrast.  Comparison: 12/23/2010  Findings: Periventricular and subcortical white matter hypodensities are most in keeping with chronic microangiopathic change. There is no evidence for acute hemorrhage, hydrocephalus, mass lesion, or abnormal extra-axial fluid collection.  No definite CT evidence for acute infarction.  Paranasal sinus opacification and mucosal thickening with high attenuation which may represent blood products, fungal colonization, or inspissated secretions. Left lens replacement.  IMPRESSION: Mild white matter hypodensities, may reflect chronic microangiopathic change.  No definite acute intracranial abnormality.  Paranasal sinus opacification is similar to prior studies. High attenuation within the sinuses may represent blood products, fungal colonization, or inspissated secretions.  Original Report Authenticated By: Waneta Martins, M.D.   Ct Head Wo Contrast  12/23/2010  *RADIOLOGY REPORT*  Clinical Data: Severe headache  CT HEAD WITHOUT CONTRAST  Technique:  Contiguous axial images were obtained from the base of the skull through the vertex without contrast.  Comparison: 03/08/2009  Findings: No evidence of parenchymal hemorrhage or extra-axial fluid collection. No mass lesion, mass effect, or midline shift.  No CT evidence of acute infarction.  Subcortical white matter and periventricular small vessel ischemic changes.  Age related atrophy.  Near complete opacification of the right maxillary, right sphenoid, and bilateral ethmoid sinuses.  Partial opacification/fluid level in the left maxillary sinus.  Mucosal thickening in the left sphenoid sinus.  This appearance is grossly unchanged from prior studies.  No evidence of calvarial fracture.  IMPRESSION: No evidence of acute intracranial abnormality.  Age-related atrophy with small vessel ischemic changes  Chronic paranasal sinus opacification,  as above.  Original Report Authenticated By: Charline Bills, M.D.   Nm Pulmonary Per & Vent  01/02/2011  *RADIOLOGY REPORT*  Clinical Data:  History of shortness of breath and positive D-dimer value.  NUCLEAR MEDICINE VENTILATION - PERFUSION LUNG SCAN  Technique:  Wash-in, equilibrium, and wash-out phase ventilation images were obtained using Xe-133 gas.  Perfusion images were obtained in multiple projections after intravenous injection of Tc- 31m MAA.  Radiopharmaceuticals:  10 mCi Xe-133 gas and  6 mCi Tc-64m MAA.  Comparison:  Chest radiograph examination 01/01/2011.  Findings:  Ventilation study: On the breath-holding and equilibrium phases no lobar or segmental defect is evident.  There is diffuse retention of xenon gas on the washout phase consistent with air trapping and obstructive pulmonary disease.  Perfusion study:  There is expected distribution of the MAA particles.  No lobar or segmental defect is evident.  No ventilation perfusion mismatch is seen.  IMPRESSION: No segmental or lobar ventilation or perfusion defects are seen. No ventilation perfusion mismatch is evident.  Examination is low probability for pulmonary embolism.  There is diffuse retention of xenon gas on the washout phase consistent with air trapping and obstructive pulmonary disease.  Original Report Authenticated By: Crawford Givens, M.D.     Disposition: Discharge home with outpatient physical therapy.  Diet: Carb modified diet.  Activity: Resume as tolerated.   Follow-up Appts: Discharge Orders    Future Appointments: Provider: Department: Dept Phone: Center:   01/12/2011 9:00 AM [name], MD [facility] [phone] [facility]   03/14/2011 9:00 AM Waymon Budge, MD [facility] [phone] None     Future Orders Please Complete By Expires   Diet Carb Modified      Increase activity slowly      Discharge instructions      Comments:   Followup with Candi Leash, MD (PCP) in 1 Week. 01/12/2011  9:00 AM  [name], MD  [facility]  [phone]  [facility]   03/14/2011 9:00 AM  Waymon Budge, MD  [facility]  [phone]           Follow-up Information    Follow up with [name], MD. (01/12/2011 9:00 AM)    Contact information:   [address] [place] [address] [phone]       Follow up with Waymon Budge, MD. (03/04/2011 9:00 AM)    Contact information:   [address] [employer] [place] [address] [phone]       Follow up with FULP,CAMMIE J. Make an appointment in 1 week.   Contact information:   [address] [place] [address] [phone]          TESTS THAT NEED FOLLOW-UP Folic acid pending.  Time spent on discharge, talking to the patient, and coordinating care: 40 mins.   Signed: Cristal Ford, MD 01/03/2011, 12:03 PM

## 2011-01-03 NOTE — Progress Notes (Signed)
Subjective: Breathing better today.  Feels better overall.  Denies any chest pain.  No specific complaints.  Objective: Vital signs in last 24 hours: Filed Vitals:   01/03/11 0208 01/03/11 0541 01/03/11 0802 01/03/11 1032  BP:  114/63    Pulse: 85 77    Temp:  97.1 F (36.2 C)    TempSrc:  Oral    Resp: 16 16    Height:      Weight:  48.852 kg (107 lb 11.2 oz)    SpO2:  93% 92% 95%   Weight change: 2.722 kg (6 lb)  Intake/Output Summary (Last 24 hours) at 01/03/11 1149 Last data filed at 01/03/11 0800  Gross per 24 hour  Intake    600 ml  Output      0 ml  Net    600 ml   Physical Exam: General: Awake, Oriented, No acute distress. HEENT: EOMI. Neck: Supple CV: S1 and S2 Lungs: Clear to ascultation bilaterally, no wheezing auscultated bilaterally. Abdomen: Soft, Nontender, Nondistended, +bowel sounds. Ext: Good pulses. Trace edema.  Lab Results:  [facility] 01/03/11 0550 01/02/11 0526  NA 135 130*  K 4.8 4.5  CL 98 93*  CO2 32 27  GLUCOSE 148* 204*  BUN 16 12  CREATININE 0.62 0.54  CALCIUM 9.2 8.9  MG -- 1.8  PHOS -- --    Basename 01/02/11 0526  AST 17  ALT 23  ALKPHOS 69  BILITOT 0.1*  PROT 6.2  ALBUMIN 2.9*   No results found for this basename: LIPASE:2,AMYLASE:2 in the last 72 hours  Basename 01/03/11 0550 01/02/11 0526 01/01/11 1633  WBC 7.4 6.1 --  NEUTROABS -- -- 3.3  HGB 11.4* 11.5* --  HCT 34.7* 34.6* --  MCV 98.9 97.7 --  PLT 289 267 --    Basename 01/02/11 1654 01/02/11 1030 01/02/11 0212  CKTOTAL 31 37 35  CKMB 3.3 3.7 4.0  CKMBINDEX -- -- --  TROPONINI <0.30 <0.30 <0.30    Basename 01/02/11 0211  POCBNP 321.0    Basename 01/02/11 0212  DDIMER 2.11*    Basename 01/02/11 0212  HGBA1C 6.3*   No results found for this basename: CHOL:2,HDL:2,LDLCALC:2,TRIG:2,CHOLHDL:2,LDLDIRECT:2 in the last 72 hours  Basename 01/02/11 0526  TSH 0.408  T4TOTAL --  T3FREE --  THYROIDAB --    Basename 01/02/11 0526  VITAMINB12 670    FOLATE --  FERRITIN --  TIBC --  IRON --  RETICCTPCT --    Micro Results: No results found for this or any previous visit (from the past 240 hour(s)).   Medications: I have reviewed the patient's current medications. Scheduled Meds:    . aspirin  81 mg Oral Daily  . budesonide  0.5 mg Nebulization BID  . diltiazem  180 mg Oral Daily  . docusate sodium  100 mg Oral BID  . gabapentin  300 mg Oral BID  . insulin aspart  0-9 Units Subcutaneous TID WC  . levalbuterol  0.63 mg Nebulization Q6H  . levofloxacin (LEVAQUIN) IV  750 mg Intravenous Q48H  . montelukast  10 mg Oral QHS  . multivitamins ther. w/minerals  1 tablet Oral Daily  . predniSONE  30 mg Oral QAC breakfast   Followed by  . predniSONE  20 mg Oral QAC breakfast   Followed by  . predniSONE  10 mg Oral QAC breakfast   Followed by  . predniSONE  5 mg Oral QAC breakfast  . rosuvastatin  5 mg Oral Daily  . sodium chloride  3 mL Intravenous Q12H  . tiotropium  18 mcg Inhalation Daily  . DISCONTD: moxifloxacin  400 mg Intravenous Q24H   Continuous Infusions:  PRN Meds:.acetaminophen, acetaminophen, albuterol, ALPRAZolam, levalbuterol, ondansetron (ZOFRAN) IV, ondansetron  Assessment/Plan: 1. Shortness of breath/COPD bronchitis.  Likely due to COPD exacerbation.  Breathing is improved on steroids and neb treatments.  Continue antibiotic as the patient has cough productive sputum.  Antibiotics since 01/01/2011.  VQ scan indicates low probability for pulmonary embolism.  2. CHEST PAIN.  Patient has been ruled out for acute coronary syndrome with negative troponins.  3.  Acute delirium/altered mental status.  Likely secondary to #1.  She reports that her mentation is improved.  Head CT doesn't show any acute abnormality.  Urine drug screen is negative.  TSH normal, vitamin B 12 normal, folate pending, and RPR nonreactive.  4. Recent history of SVT (supraventricular tachycardia).  Patient was recently discharged from  cardiology service on 12/27/2010.  Patient is rate controlled during the course of hospital stay.  5.  Type 2 diabetes.  Continue insulin.  Blood sugar elevated due to steroids.  6.  History of DVT.  Bilateral lower extremity Dopplers on 01/02/2003 did not show any DVTs.  7.  History of LV dysfunction stable  8.  Prophylaxis.  SCDs.  9.  Disposition.  Physical therapy is recommending outpatient physical therapy which will be arranged at the time of discharge.   LOS: 2 days  Terrell Shimko A, MD 01/03/2011, 11:49 AM

## 2011-01-04 ENCOUNTER — Telehealth: Payer: Self-pay | Admitting: Internal Medicine

## 2011-01-04 MED ORDER — ROSUVASTATIN CALCIUM 5 MG PO TABS: 5.0000 mg | ORAL_TABLET | Freq: Every day | ORAL | Status: DC

## 2011-01-04 NOTE — Telephone Encounter (Signed)
Spoke with pt, script sent to [facility]

## 2011-01-04 NOTE — Telephone Encounter (Signed)
New message:  Pt discharged on Crestor (generic) 5 mg.  And she does not have this medication.  Please call this into [facility] if she is to take.  Please call and advise if should take and if it has been called in.

## 2011-01-12 ENCOUNTER — Encounter: Payer: Self-pay | Admitting: Internal Medicine

## 2011-01-12 ENCOUNTER — Encounter: Payer: Medicare Other | Admitting: Internal Medicine

## 2011-01-12 ENCOUNTER — Ambulatory Visit (INDEPENDENT_AMBULATORY_CARE_PROVIDER_SITE_OTHER): Payer: Medicare Other | Admitting: Internal Medicine

## 2011-01-12 DIAGNOSIS — J449 Chronic obstructive pulmonary disease, unspecified: Secondary | ICD-10-CM

## 2011-01-12 DIAGNOSIS — IMO0001 Reserved for inherently not codable concepts without codable children: Secondary | ICD-10-CM

## 2011-01-12 DIAGNOSIS — R55 Syncope and collapse: Secondary | ICD-10-CM

## 2011-01-12 NOTE — Patient Instructions (Signed)
Your physician recommends that you schedule a follow-up appointment in: 3 months with Dr Taylor  

## 2011-01-12 NOTE — Assessment & Plan Note (Signed)
She has had no recurrent symptoms since discharge from the hospital. Implantable loop recorder interrogation demonstrates nonsustained SVT at rates in the 170 -180 beats per minute.

## 2011-01-12 NOTE — Assessment & Plan Note (Signed)
The patient is on home oxygen therapy. Today we stopped her oxygen and had her ambulate with pulse oximetry and found that her oxygen saturation never dropped below 92%. I have encouraged the patient to continue exercising by walking. At this point she need not be on oxygen therapy unless she feels more dyspnea.
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
## 2011-01-13 ENCOUNTER — Telehealth: Payer: Self-pay | Admitting: Internal Medicine

## 2011-01-13 NOTE — Telephone Encounter (Signed)
I spoke with pt and she states she was d/c'd from the hospital 2 days ago and needs a HFU w/ Dr. Maple Hudson next week. Please advise a work in spot for [name], thanks

## 2011-01-13 NOTE — Telephone Encounter (Signed)
I spoke with patient-she is aware we have appt scheduled on 01-19-2011 at 11am with CY for HFU.

## 2011-01-19 ENCOUNTER — Encounter: Payer: Self-pay | Admitting: Internal Medicine

## 2011-01-19 ENCOUNTER — Ambulatory Visit (INDEPENDENT_AMBULATORY_CARE_PROVIDER_SITE_OTHER): Payer: Medicare Other | Admitting: Internal Medicine

## 2011-01-19 VITALS — BP 118/62 | HR 72 | Ht 62.0 in | Wt 102.6 lb

## 2011-01-19 DIAGNOSIS — J329 Chronic sinusitis, unspecified: Secondary | ICD-10-CM

## 2011-01-19 DIAGNOSIS — J449 Chronic obstructive pulmonary disease, unspecified: Secondary | ICD-10-CM

## 2011-01-19 MED ORDER — LEVALBUTEROL HCL 0.63 MG/3ML IN NEBU
0.6300 mg | INHALATION_SOLUTION | Freq: Once | RESPIRATORY_TRACT | Status: AC
  Administered 2011-01-19: 0.63 mg via RESPIRATORY_TRACT

## 2011-01-19 MED ORDER — LEVALBUTEROL HCL 0.63 MG/3ML IN NEBU: 0.6300 mg | INHALATION_SOLUTION | Freq: Four times a day (QID) | RESPIRATORY_TRACT | Status: DC | PRN

## 2011-01-19 NOTE — Progress Notes (Signed)
Patient ID: [name], female    DOB: 1932-01-24, 77 y.o.   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  Followed for COPD, complicated by chronic sinusitis, rhinitis. Last here April 12, 2010 after hosp for COPD exacerbation. Older sister was chain smoker who died of emphysema.  Now here for post hospital f/u after hosp 5/28-29/12 for exacerb asthma/ COPD. She hurried home early to care for debilitated husband. She blames this admission on trial of new "Advanced Aspirin" taken for joint pain. No prior prob with aspirin, but usually has used tylenol.  Today feels well, a little bit tight. Now tapering prednisone and no concerns with current meds.   08/11/10- Breathing is fair- aware of some shortness, of breath and using her rescue inhaler 0-3/day. Stuffy nose- denies need for treatment.  Incidental shingles across left breast- Taking a pill twice daily. No fever. Denies chest infection. Husband- Hospice for prostate cancer.  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  Followed for COPD, complicated by chronic sinusitis, rhinitis. Husband died and she admits being depressed, having difficulty coping. Hasn't felt well for 10 days with increased cough. Sputum is stained orange by cough syrup but otherwise not purulent. Denies fever chest pain, blood or swollen glands. Using rescue inhaler frequently. Went to the emergency room one month ago for shortness of breath.  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  Followed for COPD, complicated by chronic sinusitis, rhinitis Has had flu shot. Hospitalized several times, once for SVT, possibly aggravated by her nebulizer use. Latest was for COPD exacerbation. Hospital notes reviewed. Ventilation perfusion lung scan on November 19 was negative for PE but showed air trapping consistent with COPD. CT scan of  head done 12/23/2010 showed pan sinusitis with an air-fluid level in the left maxillary sinus. She complains of frontal headache and chronic nasal congestion. Chest feels comfortable but she admits that he persistent productive cough with white to yellow sputum. No blood. She's not sure about fever. She has been using her home nebulizer only occasionally because she thinks the albuterol solution burns her mouth.  Review of stems: See HPI Constitutional:   No-   weight loss, night sweats, fevers, chills, fatigue, lassitude. HEENT:   + headaches, No-difficulty swallowing, tooth/dental problems, sore throat,       No-  sneezing, itching, ear ache,  nasal congestion, post nasal drip,  CV:  No-   chest pain, orthopnea, PND, swelling in lower extremities, anasarca, dizziness, palpitations Resp: +  shortness of breath with exertion or at rest.              +  productive cough,  No non-productive cough,  No- coughing up of blood.              No- purulent  change in color of mucus.  No- wheezing.   Skin: No-   rash or lesions. GI:  No-   heartburn, indigestion, abdominal pain, nausea, vomiting, diarrhea,                 change in bowel habits, loss of appetite GU: No-   dysuria, change in color of urine, no urgency or frequency.  No- flank pain. MS:  No-  acute  joint pain or swelling.  No- decreased range of motion.  No- back pain. Neuro-     nothing unusual Psych:  Depressed since husband died.  No memory loss.       Objective:  General- Alert, Oriented, Affect-appropriate, Distress- none acute- + malaise Skin- rash-none, lesions- none, excoriation- none Lymphadenopathy- none Head- atraumatic            Eyes- Gross vision intact, PERRLA, conjunctivae clear secretions            Ears- Hearing, canals-normal            Nose- nasal stuffiness with turbinate edema,  no-Septal dev,polyps, erosion, perforation             Throat- Mallampati II , mucosa clear , drainage- none, tonsils-  atrophic Neck- flexible , trachea midline, no stridor , thyroid nl, carotid no bruit Chest - symmetrical excursion , unlabored           Heart/CV- RRR , no murmur , no gallop  , no rub, nl s1 s2                           - JVD- none , edema- none, stasis changes- none, varices- none           Lung- Distant with few faint rhonchi, mild cough , dullness-none, rub- none           Chest wall-  Abd- tender-no, distended-no, bowel sounds-present, HSM- no Br/ Gen/ Rectal- Not done, not indicated Extrem- cyanosis- none, clubbing, none, atrophy- none, strength- nl Neuro- grossly intact to observation

## 2011-01-19 NOTE — Patient Instructions (Addendum)
Order- refer to [facility] ENT   Dx chronic sinusitis  Neb xop 0.63  Script to change from albuterol to Xopenex/ levalbuterol in your nebulizer machine. See if you like it better.   Please call as needed

## 2011-01-20 ENCOUNTER — Telehealth: Payer: Self-pay | Admitting: Internal Medicine

## 2011-01-20 NOTE — Telephone Encounter (Signed)
I spoke with the pt's son, Gerlene Burdock, and he just wanted clarification on the pt's meds. He is going to check his mothers medications against the current med list and will call if there are any differences.

## 2011-01-22 NOTE — Assessment & Plan Note (Signed)
We will refer her to ENT. She has not responded to antibiotics so far.

## 2011-01-22 NOTE — Assessment & Plan Note (Signed)
Home nebulizer would be helpful for her if tolerated. I don't know if she finds albuterol nebulizer solution burns. We will try switching to Xopenex.

## 2011-01-24 ENCOUNTER — Telehealth: Payer: Self-pay | Admitting: Internal Medicine

## 2011-01-24 NOTE — Telephone Encounter (Signed)
lmom for son  Dr Ladona Ridgel spoke with Dr Jenne Pane regarding the patient at [phone]  And to call me back if needed

## 2011-01-24 NOTE — Telephone Encounter (Signed)
Spoke with patients son and got her cardiac medications straight

## 2011-01-24 NOTE — Telephone Encounter (Signed)
Pt son has questions regarding medication and needs a call back asap

## 2011-02-02 ENCOUNTER — Telehealth: Payer: Self-pay | Admitting: Internal Medicine

## 2011-02-02 NOTE — Telephone Encounter (Signed)
[email or web address]  

## 2011-02-02 NOTE — Telephone Encounter (Signed)
Pt called back to add the following: she is spitting up green mucus as well. [name]

## 2011-02-03 NOTE — Telephone Encounter (Signed)
Called spoke with patient who c/o tightness in chest on 12.19.12 after taking her xopenex neb.  Pt reported that she is no longer having this symptom and "feels good now."  Pt stated that the prod cough with green mucus was an isolated even on that same date and saw her PCP regarding this who told her that is was "some infection."  Pt stated that she is not having any more symptoms and denied any wheezing or increased SOB.    Dr Maple Hudson, do you have any recs for pt?

## 2011-02-03 NOTE — Telephone Encounter (Signed)
Patient aware.

## 2011-02-03 NOTE — Telephone Encounter (Signed)
Per CY-if she feels well, then don't use the Xopenex neb.

## 2011-02-10 ENCOUNTER — Emergency Department (HOSPITAL_COMMUNITY): Payer: Medicare Other

## 2011-02-10 ENCOUNTER — Encounter (HOSPITAL_COMMUNITY): Payer: Self-pay | Admitting: *Deleted

## 2011-02-10 ENCOUNTER — Observation Stay (HOSPITAL_COMMUNITY)
Admission: EM | Admit: 2011-02-10 | Discharge: 2011-02-12 | Disposition: A | Discharge status: Home / Self Care | Payer: Medicare Other | Attending: Internal Medicine | Admitting: Internal Medicine

## 2011-02-10 ENCOUNTER — Other Ambulatory Visit: Payer: Self-pay

## 2011-02-10 DIAGNOSIS — J329 Chronic sinusitis, unspecified: Secondary | ICD-10-CM

## 2011-02-10 DIAGNOSIS — E119 Type 2 diabetes mellitus without complications: Secondary | ICD-10-CM

## 2011-02-10 DIAGNOSIS — J309 Allergic rhinitis, unspecified: Secondary | ICD-10-CM

## 2011-02-10 DIAGNOSIS — J449 Chronic obstructive pulmonary disease, unspecified: Secondary | ICD-10-CM

## 2011-02-10 DIAGNOSIS — R079 Chest pain, unspecified: Secondary | ICD-10-CM

## 2011-02-10 DIAGNOSIS — E785 Hyperlipidemia, unspecified: Secondary | ICD-10-CM | POA: Insufficient documentation

## 2011-02-10 DIAGNOSIS — M792 Neuralgia and neuritis, unspecified: Secondary | ICD-10-CM

## 2011-02-10 DIAGNOSIS — J9610 Chronic respiratory failure, unspecified whether with hypoxia or hypercapnia: Secondary | ICD-10-CM

## 2011-02-10 DIAGNOSIS — I519 Heart disease, unspecified: Secondary | ICD-10-CM

## 2011-02-10 DIAGNOSIS — R112 Nausea with vomiting, unspecified: Secondary | ICD-10-CM | POA: Insufficient documentation

## 2011-02-10 DIAGNOSIS — R1110 Vomiting, unspecified: Secondary | ICD-10-CM

## 2011-02-10 DIAGNOSIS — I471 Supraventricular tachycardia, unspecified: Secondary | ICD-10-CM

## 2011-02-10 DIAGNOSIS — J4489 Other specified chronic obstructive pulmonary disease: Secondary | ICD-10-CM

## 2011-02-10 DIAGNOSIS — K5900 Constipation, unspecified: Secondary | ICD-10-CM | POA: Insufficient documentation

## 2011-02-10 DIAGNOSIS — R1013 Epigastric pain: Principal | ICD-10-CM | POA: Insufficient documentation

## 2011-02-10 DIAGNOSIS — IMO0001 Reserved for inherently not codable concepts without codable children: Secondary | ICD-10-CM

## 2011-02-10 DIAGNOSIS — R410 Disorientation, unspecified: Secondary | ICD-10-CM

## 2011-02-10 DIAGNOSIS — I10 Essential (primary) hypertension: Secondary | ICD-10-CM

## 2011-02-10 DIAGNOSIS — R55 Syncope and collapse: Secondary | ICD-10-CM

## 2011-02-10 HISTORY — DX: Cough: R05

## 2011-02-10 HISTORY — DX: Chronic cough: R05.3

## 2011-02-10 LAB — POCT I-STAT TROPONIN I: Troponin i, poc: 0 ng/mL (ref 0.00–0.08)

## 2011-02-10 LAB — CBC
HCT: 37 % (ref 36.0–46.0)
Hemoglobin: 12.4 g/dL (ref 12.0–15.0)
MCH: 32.1 pg (ref 26.0–34.0)
MCHC: 33.5 g/dL (ref 30.0–36.0)
MCV: 95.9 fL (ref 78.0–100.0)
Platelets: 240 K/uL (ref 150–400)
RBC: 3.86 MIL/uL — ABNORMAL LOW (ref 3.87–5.11)
RDW: 13.5 % (ref 11.5–15.5)
WBC: 3.8 K/uL — ABNORMAL LOW (ref 4.0–10.5)

## 2011-02-10 LAB — DIFFERENTIAL
Basophils Absolute: 0 K/uL (ref 0.0–0.1)
Basophils Relative: 0 % (ref 0–1)
Eosinophils Absolute: 0.1 10*3/uL (ref 0.0–0.7)
Eosinophils Relative: 2 % (ref 0–5)
Lymphocytes Relative: 20 % (ref 12–46)
Lymphs Abs: 0.7 10*3/uL (ref 0.7–4.0)
Monocytes Absolute: 0.3 10*3/uL (ref 0.1–1.0)
Monocytes Relative: 8 % (ref 3–12)
Neutro Abs: 2.6 K/uL (ref 1.7–7.7)
Neutrophils Relative %: 70 % (ref 43–77)

## 2011-02-10 LAB — COMPREHENSIVE METABOLIC PANEL
Alkaline Phosphatase: 75 U/L (ref 39–117)
BUN: 13 mg/dL (ref 6–23)
Creatinine, Ser: 0.64 mg/dL (ref 0.50–1.10)
GFR calc Af Amer: >90 mL/min (ref >90)
Glucose, Bld: 166 mg/dL — ABNORMAL HIGH (ref 70–99)
Potassium: 4.2 mEq/L (ref 3.5–5.1)
Total Bilirubin: 0.2 mg/dL — ABNORMAL LOW (ref 0.3–1.2)
Total Protein: 6.5 g/dL (ref 6.0–8.3)

## 2011-02-10 LAB — URINALYSIS, ROUTINE W REFLEX MICROSCOPIC
Bilirubin Urine: NEGATIVE
Glucose, UA: NEGATIVE mg/dL
Hgb urine dipstick: NEGATIVE
Ketones, ur: NEGATIVE mg/dL
Leukocytes, UA: NEGATIVE
Nitrite: NEGATIVE
Protein, ur: NEGATIVE mg/dL
Specific Gravity, Urine: 1.015 (ref 1.005–1.030)
Urobilinogen, UA: 1 mg/dL (ref 0.0–1.0)
pH: 7 (ref 5.0–8.0)

## 2011-02-10 LAB — COMPREHENSIVE METABOLIC PANEL WITH GFR
ALT: 18 U/L (ref 0–35)
AST: 16 U/L (ref 0–37)
Albumin: 3.1 g/dL — ABNORMAL LOW (ref 3.5–5.2)
CO2: 30 meq/L (ref 19–32)
Calcium: 8.9 mg/dL (ref 8.4–10.5)
Chloride: 94 meq/L — ABNORMAL LOW (ref 96–112)
GFR calc non Af Amer: 84 mL/min — ABNORMAL LOW (ref >90)
Sodium: 132 meq/L — ABNORMAL LOW (ref 135–145)

## 2011-02-10 LAB — LIPASE, BLOOD: Lipase: 28 U/L (ref 11–59)

## 2011-02-10 MED ORDER — FENTANYL CITRATE 0.05 MG/ML IJ SOLN
50.0000 ug | Freq: Once | INTRAMUSCULAR | Status: AC
  Administered 2011-02-10: 50 ug via INTRAVENOUS
  Filled 2011-02-10: qty 2

## 2011-02-10 MED ORDER — ALBUTEROL SULFATE HFA 108 (90 BASE) MCG/ACT IN AERS
1.0000 | INHALATION_SPRAY | Freq: Four times a day (QID) | RESPIRATORY_TRACT | Status: DC | PRN
  Filled 2011-02-10: qty 6.7

## 2011-02-10 MED ORDER — INSULIN ASPART 100 UNIT/ML ~~LOC~~ SOLN
0.0000 [IU] | Freq: Three times a day (TID) | SUBCUTANEOUS | Status: DC
  Administered 2011-02-11: 1 [IU] via SUBCUTANEOUS
  Administered 2011-02-11 (×2): 2 [IU] via SUBCUTANEOUS

## 2011-02-10 MED ORDER — ONDANSETRON HCL 4 MG/2ML IJ SOLN: 4.0000 mg | Freq: Three times a day (TID) | INTRAMUSCULAR | Status: AC | PRN

## 2011-02-10 MED ORDER — ACETAMINOPHEN 650 MG RE SUPP: 650.0000 mg | Freq: Four times a day (QID) | RECTAL | Status: DC | PRN

## 2011-02-10 MED ORDER — SODIUM CHLORIDE 0.9 % IV SOLN
INTRAVENOUS | Status: DC
  Administered 2011-02-11 – 2011-02-12 (×2): via INTRAVENOUS

## 2011-02-10 MED ORDER — OXYCODONE HCL 5 MG PO TABS
5.0000 mg | ORAL_TABLET | ORAL | Status: DC | PRN
  Administered 2011-02-11: 5 mg via ORAL
  Filled 2011-02-10: qty 1

## 2011-02-10 MED ORDER — ONDANSETRON HCL 4 MG PO TABS: 4.0000 mg | ORAL_TABLET | Freq: Four times a day (QID) | ORAL | Status: DC | PRN

## 2011-02-10 MED ORDER — SODIUM CHLORIDE 0.9 % IV SOLN
INTRAVENOUS | Status: AC
  Administered 2011-02-10: 22:00:00 via INTRAVENOUS

## 2011-02-10 MED ORDER — SODIUM CHLORIDE 0.9 % IV BOLUS (SEPSIS)
500.0000 mL | Freq: Once | INTRAVENOUS | Status: AC
  Administered 2011-02-10: 500 mL via INTRAVENOUS

## 2011-02-10 MED ORDER — ASPIRIN EC 81 MG PO TBEC
81.0000 mg | DELAYED_RELEASE_TABLET | Freq: Every day | ORAL | Status: DC
  Administered 2011-02-10 – 2011-02-12 (×3): 81 mg via ORAL
  Filled 2011-02-10 (×3): qty 1

## 2011-02-10 MED ORDER — ADULT MULTIVITAMIN W/MINERALS CH
1.0000 | ORAL_TABLET | Freq: Every day | ORAL | Status: DC
  Administered 2011-02-10 – 2011-02-12 (×3): 1 via ORAL
  Filled 2011-02-10 (×3): qty 1

## 2011-02-10 MED ORDER — ONDANSETRON HCL 4 MG/2ML IJ SOLN
4.0000 mg | Freq: Once | INTRAMUSCULAR | Status: AC
  Administered 2011-02-10: 4 mg via INTRAVENOUS

## 2011-02-10 MED ORDER — HYDROMORPHONE HCL PF 1 MG/ML IJ SOLN: 0.5000 mg | INTRAMUSCULAR | Status: DC | PRN

## 2011-02-10 MED ORDER — ALBUTEROL SULFATE (5 MG/ML) 0.5% IN NEBU
2.5000 mg | INHALATION_SOLUTION | Freq: Four times a day (QID) | RESPIRATORY_TRACT | Status: DC
  Administered 2011-02-11 – 2011-02-12 (×4): 2.5 mg via RESPIRATORY_TRACT
  Filled 2011-02-10 (×5): qty 0.5

## 2011-02-10 MED ORDER — ONDANSETRON HCL 4 MG/2ML IJ SOLN
4.0000 mg | Freq: Once | INTRAMUSCULAR | Status: AC
  Administered 2011-02-10: 4 mg via INTRAVENOUS
  Filled 2011-02-10: qty 2

## 2011-02-10 MED ORDER — ONE-DAILY MULTI VITAMINS PO TABS: 1.0000 | ORAL_TABLET | Freq: Every day | ORAL | Status: DC

## 2011-02-10 MED ORDER — ONDANSETRON HCL 4 MG/2ML IJ SOLN
INTRAMUSCULAR | Status: AC
  Filled 2011-02-10: qty 2

## 2011-02-10 MED ORDER — DILTIAZEM HCL ER COATED BEADS 180 MG PO CP24
180.0000 mg | ORAL_CAPSULE | Freq: Every day | ORAL | Status: DC
  Administered 2011-02-10 – 2011-02-12 (×3): 180 mg via ORAL
  Filled 2011-02-10 (×3): qty 1

## 2011-02-10 MED ORDER — GABAPENTIN 300 MG PO CAPS
300.0000 mg | ORAL_CAPSULE | Freq: Two times a day (BID) | ORAL | Status: DC
  Administered 2011-02-10 – 2011-02-12 (×4): 300 mg via ORAL
  Filled 2011-02-10 (×5): qty 1

## 2011-02-10 MED ORDER — SODIUM CHLORIDE 0.9 % IV SOLN
Freq: Once | INTRAVENOUS | Status: AC
  Administered 2011-02-11: 18:00:00 via INTRAVENOUS

## 2011-02-10 MED ORDER — MONTELUKAST SODIUM 10 MG PO TABS
10.0000 mg | ORAL_TABLET | Freq: Every day | ORAL | Status: DC
  Administered 2011-02-10 – 2011-02-12 (×3): 10 mg via ORAL
  Filled 2011-02-10 (×3): qty 1

## 2011-02-10 MED ORDER — ASPIRIN 81 MG PO TABS: 81.0000 mg | ORAL_TABLET | Freq: Every day | ORAL | Status: DC

## 2011-02-10 MED ORDER — TIOTROPIUM BROMIDE MONOHYDRATE 18 MCG IN CAPS
18.0000 ug | ORAL_CAPSULE | Freq: Every day | RESPIRATORY_TRACT | Status: DC
  Administered 2011-02-11 – 2011-02-12 (×2): 18 ug via RESPIRATORY_TRACT
  Filled 2011-02-10: qty 5

## 2011-02-10 MED ORDER — BISACODYL 10 MG RE SUPP: 10.0000 mg | Freq: Once | RECTAL | Status: DC

## 2011-02-10 MED ORDER — ACETAMINOPHEN 325 MG PO TABS
650.0000 mg | ORAL_TABLET | Freq: Four times a day (QID) | ORAL | Status: DC | PRN
  Administered 2011-02-11 (×2): 650 mg via ORAL
  Filled 2011-02-10 (×2): qty 2

## 2011-02-10 MED ORDER — ALUM & MAG HYDROXIDE-SIMETH 200-200-20 MG/5ML PO SUSP
30.0000 mL | Freq: Once | ORAL | Status: AC
  Administered 2011-02-10: 30 mL via ORAL
  Filled 2011-02-10: qty 30

## 2011-02-10 MED ORDER — ENOXAPARIN SODIUM 40 MG/0.4ML ~~LOC~~ SOLN
40.0000 mg | Freq: Every day | SUBCUTANEOUS | Status: DC
  Administered 2011-02-10 – 2011-02-11 (×2): 40 mg via SUBCUTANEOUS
  Filled 2011-02-10 (×3): qty 0.4

## 2011-02-10 MED ORDER — PANTOPRAZOLE SODIUM 40 MG IV SOLR
40.0000 mg | Freq: Once | INTRAVENOUS | Status: AC
  Administered 2011-02-10: 40 mg via INTRAVENOUS
  Filled 2011-02-10: qty 40

## 2011-02-10 MED ORDER — SIMVASTATIN 5 MG PO TABS
5.0000 mg | ORAL_TABLET | Freq: Every day | ORAL | Status: DC
  Administered 2011-02-10 – 2011-02-11 (×2): 5 mg via ORAL
  Filled 2011-02-10 (×3): qty 1

## 2011-02-10 MED ORDER — VITAMIN D (ERGOCALCIFEROL) 1.25 MG (50000 UNIT) PO CAPS
50000.0000 [IU] | ORAL_CAPSULE | ORAL | Status: DC
  Administered 2011-02-10: 50000 [IU] via ORAL
  Filled 2011-02-10: qty 1

## 2011-02-10 MED ORDER — VITAMIN D3 1.25 MG (50000 UT) PO CAPS: 50000.0000 [IU] | ORAL_CAPSULE | ORAL | Status: DC

## 2011-02-10 MED ORDER — ONDANSETRON HCL 4 MG/2ML IJ SOLN
4.0000 mg | Freq: Four times a day (QID) | INTRAMUSCULAR | Status: DC | PRN
  Administered 2011-02-10: 4 mg via INTRAVENOUS
  Filled 2011-02-10: qty 2

## 2011-02-10 MED ORDER — METOCLOPRAMIDE HCL 5 MG/ML IJ SOLN
10.0000 mg | Freq: Once | INTRAMUSCULAR | Status: AC
  Administered 2011-02-10: 10 mg via INTRAVENOUS

## 2011-02-10 MED ORDER — ZOLPIDEM TARTRATE 5 MG PO TABS
5.0000 mg | ORAL_TABLET | Freq: Every evening | ORAL | Status: DC | PRN
  Administered 2011-02-11: 5 mg via ORAL
  Filled 2011-02-10: qty 1

## 2011-02-10 MED ORDER — FAMOTIDINE IN NACL 20-0.9 MG/50ML-% IV SOLN
20.0000 mg | Freq: Once | INTRAVENOUS | Status: AC
  Administered 2011-02-10: 20 mg via INTRAVENOUS
  Filled 2011-02-10: qty 50

## 2011-02-10 MED ORDER — FENTANYL CITRATE 0.05 MG/ML IJ SOLN: 50.0000 ug | INTRAMUSCULAR | Status: DC | PRN

## 2011-02-10 MED ORDER — INSULIN ASPART 100 UNIT/ML ~~LOC~~ SOLN
0.0000 [IU] | Freq: Every day | SUBCUTANEOUS | Status: DC
  Filled 2011-02-10: qty 3

## 2011-02-10 MED ORDER — METOCLOPRAMIDE HCL 5 MG/ML IJ SOLN
INTRAMUSCULAR | Status: AC
  Filled 2011-02-10: qty 2

## 2011-02-10 NOTE — ED Notes (Signed)
Patient transported to Ultrasound 

## 2011-02-10 NOTE — ED Notes (Signed)
Pt transported back to ED from ultrasound on stretcher by tech and tolerated well.  Pt reports no relief from abdominal pain with medication given.  Pt requesting water to drink,  Pt instructed to await imaging and lab results.

## 2011-02-10 NOTE — ED Notes (Signed)
Pt arrived by ems. Pt reports going to pcp yesterday, was given prescriptions but unsure what they are or what they are for. Reports generalized abd pain for extended amount of time, having no productive cough, nausea, and weight loss. No acute distress noted at this time, denies vomiting or diarrhea.

## 2011-02-10 NOTE — ED Notes (Signed)
Urine sample has been provided for lab.

## 2011-02-10 NOTE — ED Notes (Signed)
Family members are richard # [phone] and Theron Arista # [phone] if needed.
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
## 2011-02-10 NOTE — ED Notes (Signed)
5157-01 READY 

## 2011-02-10 NOTE — ED Notes (Signed)
Transported to room 5157 via stretcher.

## 2011-02-10 NOTE — ED Provider Notes (Signed)
Case discussed with hospitalist.  Patient admitted to observation bed.  Nelia Shi, MD 02/10/11 [phone]

## 2011-02-10 NOTE — ED Notes (Signed)
Pt repositioned for comfort in bedside chair.

## 2011-02-10 NOTE — ED Notes (Signed)
Pt attempting to drink contrast from radiology and began retching.

## 2011-02-10 NOTE — ED Notes (Signed)
Pt repositioned herself back onto stretcher and is yelling "Help".  Pt states "I feel so sick, I've got to have something to drink".  Pt instructed to await CT scan results, few ice chips given.  Pt instructed not to yell, but to push call bell that remains at bedside.

## 2011-02-11 LAB — CBC
MCV: 95.2 fL (ref 78.0–100.0)
Platelets: 254 10*3/uL (ref 150–400)
RBC: 4.16 MIL/uL (ref 3.87–5.11)
RDW: 13.2 % (ref 11.5–15.5)
WBC: 4.1 10*3/uL (ref 4.0–10.5)

## 2011-02-11 LAB — HEMOGLOBIN A1C: Hgb A1c MFr Bld: 6.2 % — ABNORMAL HIGH (ref <5.7)

## 2011-02-11 LAB — BASIC METABOLIC PANEL
CO2: 25 mEq/L (ref 19–32)
Calcium: 8.5 mg/dL (ref 8.4–10.5)
GFR calc Af Amer: >90 mL/min (ref >90)
GFR calc non Af Amer: 84 mL/min — ABNORMAL LOW (ref >90)
Sodium: 130 mEq/L — ABNORMAL LOW (ref 135–145)

## 2011-02-11 LAB — GLUCOSE, CAPILLARY: Glucose-Capillary: 178 mg/dL — ABNORMAL HIGH (ref 70–99)

## 2011-02-11 MED ORDER — BIOTENE DRY MOUTH MT LIQD: 15.0000 mL | Freq: Two times a day (BID) | OROMUCOSAL | Status: DC

## 2011-02-11 MED ORDER — BISACODYL 10 MG RE SUPP
10.0000 mg | Freq: Once | RECTAL | Status: AC
  Administered 2011-02-11: 10 mg via RECTAL
  Filled 2011-02-11: qty 1

## 2011-02-11 MED ORDER — CHLORHEXIDINE GLUCONATE 0.12 % MT SOLN
15.0000 mL | Freq: Two times a day (BID) | OROMUCOSAL | Status: DC
  Administered 2011-02-11: 15 mL via OROMUCOSAL
  Filled 2011-02-11 (×2): qty 15

## 2011-02-11 NOTE — Progress Notes (Signed)
Subjective:  77 year old patient who was admitted yesterday with a two-day history of nausea vomiting and a two-week history of constipation she has tolerated fairly well a clear liquid diet but did have a single episode of emesis at approximately 11:00 AM today. She has had a fairly normal bowel movement well formed after a suppository earlier today. She has some anorexia but no nausea  Objective: Vital signs in last 24 hours: Temp:  [97.8 F (36.6 C)-98.4 F (36.9 C)] 98.2 F (36.8 C) (12/29 1100) Pulse Rate:  [69-88] 69  (12/29 1100) Resp:  [16-27] 20  (12/29 1100) BP: (108-158)/(58-90) 112/60 mmHg (12/29 1100) SpO2:  [90 %-99 %] 99 % (12/29 1100) Weight:  [43.999 kg (97 lb)] 97 lb (43.999 kg) (12/28 2025) Weight change:  Last BM Date: 02/11/11  Intake/Output from previous day: 12/28 0701 - 12/29 0700 In: 1039 [P.O.:120; I.V.:919] Out: 702 [Urine:700; Stool:2] Intake/Output this shift: Total I/O In: 240 [P.O.:240] Out: -   Physical examination Gen. exam revealed her to be alert comfortable and in no distress HEENT exam unremarkable Chest was clear Cardiovascular exam revealed normal heart sounds without murmurs Abdomen soft flat and nondistended there is no organomegaly bowel sounds are quite active and appeared to be normal   Lab Results:  Basename 02/11/11 0600 02/10/11 0951  WBC 4.1 3.8*  HGB 13.1 12.4  HCT 39.6 37.0  PLT 254 240   BMET  Basename 02/11/11 0600 02/10/11 0951  NA 130* 132*  K 4.2 4.2  CL 94* 94*  CO2 25 30  GLUCOSE 143* 166*  BUN 10 13  CREATININE 0.64 0.64  CALCIUM 8.5 8.9   CBG (last 3)   Basename 02/11/11 0754 02/10/11 2158  GLUCAP 154* 142*    BP Readings from Last 3 Encounters:  02/11/11 112/60  01/19/11 118/62  01/12/11 102/60      Studies/Results: Ct Abdomen Pelvis Wo Contrast  02/10/2011  *RADIOLOGY REPORT*  Clinical Data: Epigastric pain.  Vomiting.  Contrast allergy.  CT ABDOMEN AND PELVIS WITHOUT CONTRAST   Technique:  Multidetector CT imaging of the abdomen and pelvis was performed following the standard protocol without intravenous contrast.  Comparison: Ultrasound dated 02/10/2011  Findings: Mild scarring noted in the lingula, right middle lobe, anteriorly in the right lower lobe.  There appears to be some mild airway plugging in the left lower lobe.  Trace pericardial effusion noted.  A hypodense lesion posteriorly in the right hepatic lobe measures 1.1 by 0.6 cm on image 34 of series 5, and is technically nonspecific although statistically likely to be benign.  The noncontrast CT appearance of the spleen, adrenal glands, gallbladder, and pancreas is unremarkable.  Atherosclerotic calcification of the abdominal aorta noted.  Orally administered contrast is noted and reaches the proximal colon.  Flat increased density in the right mid upper kidney peripherally on image 34 of series 103 could represent a small hemorrhagic cyst or a small capsular calcification.  No renal calculus is observed. No hydronephrosis or hydroureter.  There are vascular phleboliths, some which are in the direct vicinity of the distal ureters, but given the lack of hydroureter I doubt these represent antral ureteral calculi.  A rim calcified 2.3 x 2.0 cm round structure posterior to the ascending colon and lateral to the right psoas muscle as an internal density of 14 HU, less than that of the gallbladder fluid but greater than that in the urinary bladder.  No pathologic retroperitoneal or porta hepatis adenopathy is identified.  No pathologic pelvic  adenopathy is identified.  Right eccentric Tarlov cyst noted at the S2 level.  Old left pubic ramus fractures appear healed.  There is asymmetric prominent degenerative arthropathy of the left hip, severe in magnitude.  Mild right hip degenerative arthropathy is present. Remote deformity of the left sacrum likely relates to old fracture.  Degenerative disc disease and spondylosis noted in the  lumbar spine.  Low-level mesenteric edema in the pelvis is technically nonspecific.  There is scattered diverticula, without definite findings of active diverticulitis the appendix is not discretely visualized.  IMPRESSION:  1.  Bibasilar scarring with mild airway plugging in the left lower lobe. 2.  Scattered pelvic phleboliths, some of which in the direct vicinity of the distal ureters, but given the lack of hydroureter I would doubt that these represent ureteral calculi. 3.  Rim calcified cystic lesion posterior to the ascending colon is likely retroperitoneal, along the lateral margin of the psoas muscle.  Differential diagnostic considerations include cystic lymphangioma, mucinous cystadenoma, none pancreatic pseudocyst, cystic mesothelioma, the layering cysts, or less likely epidermoid cyst. Mucinous neoplasm cannot be entirely excluded. 4.  Old, healed left pelvic fractures. 5.  Severe degenerative arthropathy left hip, asymmetric and possibly post-traumatic. 6.  Small focus of capsular calcification or small flat hemorrhagic cyst node along the right kidney upper pole. 7.  Low-level mesenteric edema, etiology uncertain. 8.  The appendix is not visualized.   This lowers negative predictive value for acute appendicitis.  Original Report Authenticated By: Dellia Cloud, M.D.   US Abdomen Complete  02/10/2011  *RADIOLOGY REPORT*  Clinical Data:  Epigastric pain, vomiting  COMPLETE ABDOMINAL ULTRASOUND  Comparison:  None.  Findings:  Gallbladder:  No gallstones, gallbladder wall thickening, or pericholecystic fluid.  Negative sonographic Murphy's sign  Common bile duct:  Normal in size measuring 5.3 mm in diameter.  Liver:  Homogeneous hepatic echotexture.  There are two subcentimeter subcapsular anechoic lesions within the right lobe of the liver which demonstrate increased through transmission and while are likely too small to accurately characterize are favored to represent hepatic cysts.  No  ascites.  IVC:  Appears normal.  Pancreas:  No focal abnormality seen.  Spleen:  Normal in size measuring 6.7 cm in length.  Right Kidney:  Normal in size, measuring 10.9 cm in length.  Normal cortical echogenicity and thickness.  There is an approximately 1.0 x 8 1.0 x 0.9 cm anechoic partially exophytic lesion arising from the inferior pole of the right kidney which is too small to accurately characterize though favored to represent a renal cyst. No right-sided urinary obstruction.  Left Kidney:  Normal cortical thickness, echogenicity and size, measuring 10.4 cm in length.  No focal renal lesions.  No echogenic renal stones.  No urinary obstruction.  Abdominal aorta:  Atherosclerotic calcifications and intimal wall thickening within a normal caliber abdominal aorta.  IMPRESSION: 1.  No explanation for patient's abdominal pain, specifically, no evidence of cholelithiasis or urinary obstruction. 2.  Sub centimeter anechoic hepatic lesions favored to represent hepatic cysts. 3.  Sub centimeter anechoic right-sided renal lesions favored to represent a renal cyst.  Original Report Authenticated By: Waynard Reeds, M.D.   Dg Chest Portable 1 View  02/10/2011  *RADIOLOGY REPORT*  Clinical Data: Cough, epigastric pain, vomiting  PORTABLE CHEST - 1 VIEW  Comparison: 01/01/2011; 12/23/2010; 12/21/2010  Findings: Grossly unchanged cardiac silhouette and mediastinal contours.  The lungs remain persistently hyperinflated with grossly unchanged mid and lower lung heterogeneous opacities.  There is  persistent blunting of the right costophrenic angle.  No definite pleural effusion or pneumothorax.  Grossly unchanged bones including old left midclavicular fracture.  Support apparatus overlies the left upper chest.  IMPRESSION: Hyperinflated lungs, chronic emphysematous change and scattered atelectasis/scar without definite superimposed acute process.  Original Report Authenticated By: Waynard Reeds, M.D.    Medications:  I have reviewed the patient's current medications.  Assessment/Plan:  Nausea vomiting. This has improved somewhat and she seems to be tolerating the clear liquid diet. She has had a mild and early today following a suppository. Will continue a clear liquid diet and advance in the morning if she continues to do well  Diabetes mellitus stable Hypertension stable    LOS: 1 day   Rogelia Boga 02/11/2011, 2:27 PM

## 2011-02-12 MED ORDER — OXYCODONE HCL 5 MG PO TABS: 5.0000 mg | ORAL_TABLET | ORAL | Status: DC | PRN

## 2011-02-12 MED ORDER — POLYETHYLENE GLYCOL 3350 17 G PO PACK: 17.0000 g | PACK | Freq: Every day | ORAL | Status: AC

## 2011-02-12 MED ORDER — POLYETHYLENE GLYCOL 3350 17 G PO PACK
17.0000 g | PACK | Freq: Every day | ORAL | Status: DC
  Administered 2011-02-12: 17 g via ORAL
  Filled 2011-02-12: qty 1

## 2011-02-12 MED ORDER — ALBUTEROL SULFATE (5 MG/ML) 0.5% IN NEBU
2.5000 mg | INHALATION_SOLUTION | Freq: Three times a day (TID) | RESPIRATORY_TRACT | Status: DC
  Administered 2011-02-12: 2.5 mg via RESPIRATORY_TRACT
  Filled 2011-02-12 (×2): qty 0.5

## 2011-02-12 NOTE — Progress Notes (Signed)
Pt tolerated solid food at lunch, will d/c home

## 2011-02-12 NOTE — Discharge Summary (Signed)
Patient ID: [name] MRN: [id number] DOB/AGE: 1931-04-05 77 y.o.  Admit date: 02/10/2011 Discharge date: 02/12/2011  Primary Care Physician:  Candi Leash, MD  Discharge Diagnoses:    Present on Admission:  **None**  Active Problems:  * No active hospital problems. *    Current Discharge Medication List    START taking these medications   Details  oxyCODONE (OXY IR/ROXICODONE) 5 MG immediate release tablet Take 1 tablet (5 mg total) by mouth every 4 (four) hours as needed. Qty: 30 tablet, Refills: 0    polyethylene glycol (MIRALAX / GLYCOLAX) packet Take 17 g by mouth daily. Qty: 14 each, Refills: 0      CONTINUE these medications which have NOT CHANGED   Details  acetaminophen (TYLENOL) 500 MG tablet Take 500 mg by mouth every 6 (six) hours as needed. For pain    albuterol (PROAIR HFA) 108 (90 BASE) MCG/ACT inhaler Inhale 1-2 puffs into the lungs as needed. For shortness of breath or wheeze    aspirin 81 MG tablet Take 81 mg by mouth daily.     diltiazem (CARDIZEM CD) 180 MG 24 hr capsule Take 1 capsule (180 mg total) by mouth daily. Qty: 30 capsule, Refills: 6    gabapentin (NEURONTIN) 300 MG capsule Take 1 capsule (300 mg total) by mouth 2 (two) times daily. Qty: 60 capsule, Refills: 0    HYDROcodone-homatropine (HYCODAN) 5-1.5 MG/5ML syrup Take 5 mLs by mouth every 4 (four) hours as needed. For coughing     levalbuterol (XOPENEX) 0.63 MG/3ML nebulizer solution Take 3 mLs (0.63 mg total) by nebulization every 6 (six) hours as needed for wheezing or shortness of breath. Qty: 75 mL, Refills: prn    montelukast (SINGULAIR) 10 MG tablet Take 1 tablet by mouth daily.    Multiple Vitamin (MULTIVITAMIN) tablet Take 1 tablet by mouth daily.     pravastatin (PRAVACHOL) 40 MG tablet Take 40 mg by mouth daily.      tiotropium (SPIRIVA) 18 MCG inhalation capsule Place 18 mcg into inhaler and inhale daily.      Cholecalciferol (VITAMIN D3) 50000 UNITS CAPS Take  50,000 Units by mouth once a week. On Sundays        Disposition and Follow-up: With PCP in one week  Consults:  None  Significant Diagnostic Studies:  Ct Abdomen Pelvis Wo Contrast  02/10/2011 IMPRESSION:  1.  Bibasilar scarring with mild airway plugging in the left lower lobe. 2.  Scattered pelvic phleboliths, some of which in the direct vicinity of the distal ureters, but given the lack of hydroureter I would doubt that these represent ureteral calculi. 3.  Rim calcified cystic lesion posterior to the ascending colon is likely retroperitoneal, along the lateral margin of the psoas muscle.  Differential diagnostic considerations include cystic lymphangioma, mucinous cystadenoma, none pancreatic pseudocyst, cystic mesothelioma, the layering cysts, or less likely epidermoid cyst. Mucinous neoplasm cannot be entirely excluded. 4.  Old, healed left pelvic fractures. 5.  Severe degenerative arthropathy left hip, asymmetric and possibly post-traumatic. 6.  Small focus of capsular calcification or small flat hemorrhagic cyst node along the right kidney upper pole. 7.  Low-level mesenteric edema, etiology uncertain. 8.  The appendix is not visualized.   This lowers negative predictive value for acute appendicitis.   US Abdomen Complete  02/10/2011    IMPRESSION: 1.  No explanation for patient's abdominal pain, specifically, no evidence of cholelithiasis or urinary obstruction. 2.  Sub centimeter anechoic hepatic lesions favored to represent hepatic cysts.  3.  Sub centimeter anechoic right-sided renal lesions favored to represent a renal cyst.  O  Dg Chest Portable 1 View  02/10/2011 IMPRESSION: Hyperinflated lungs, chronic emphysematous change and scattered atelectasis/scar without definite superimposed acute process.    Brief H and P: 77 y.o. female with complaints of severe abdominal pain and nausea and vomiting X 2 days, and constipation x 2 weeks. She denied any hematemesis, she also denied  having fevers or chills. The abdominal pain is epigastric mainly but also generalized. She reported that she was afraid to take any laxitives for her constipation because of her abdominal pain. She has not been able to hold down foods or liquids. She was evaluated in the ER and had imaging labs and imaging studies performed all of which have not revealed any acute pathology.    Physical Exam on Discharge:  Filed Vitals:   02/11/11 2144 02/12/11 0158 02/12/11 0500 02/12/11 0738  BP: 125/65  113/64   Pulse: 81  72   Temp: 98.1 F (36.7 C)  98.1 F (36.7 C)   TempSrc: Oral  Oral   Resp: 18  18   Height:      Weight:      SpO2: 97% 94% 93% 94%     Intake/Output Summary (Last 24 hours) at 02/12/11 1011 Last data filed at 02/12/11 9528  Gross per 24 hour  Intake   2897 ml  Output    353 ml  Net   2544 ml    General: Alert, awake, oriented x3, in no acute distress. HEENT: No bruits, no goiter. Heart: Regular rate and rhythm, without murmurs, rubs, gallops. Lungs: Clear to auscultation bilaterally. Abdomen: Soft, nontender, nondistended, positive bowel sounds. Extremities: No clubbing cyanosis or edema with positive pedal pulses. Neuro: Grossly intact, nonfocal.  CBC:    Component Value Date/Time   WBC 4.1 02/11/2011 0600   HGB 13.1 02/11/2011 0600   HCT 39.6 02/11/2011 0600   PLT 254 02/11/2011 0600   MCV 95.2 02/11/2011 0600   NEUTROABS 2.6 02/10/2011 0951   LYMPHSABS 0.7 02/10/2011 0951   MONOABS 0.3 02/10/2011 0951   EOSABS 0.1 02/10/2011 0951   BASOSABS 0.0 02/10/2011 0951    Basic Metabolic Panel:    Component Value Date/Time   NA 130* 02/11/2011 0600   K 4.2 02/11/2011 0600   CL 94* 02/11/2011 0600   CO2 25 02/11/2011 0600   BUN 10 02/11/2011 0600   CREATININE 0.64 02/11/2011 0600   GLUCOSE 143* 02/11/2011 0600   CALCIUM 8.5 02/11/2011 0600    Hospital Course:   Principal problem:  *Nausea and vomiting - Patient tolerates solid food - No complains  of nausea or vomiting at the time of discharge  Active problem:  COPD/emphysema - Continue home medications  Education - Patient is aware of plan of care and treatment  Disposition - Patient is medically stable and clinically appears well to be discharged home today    Time spent on Discharge: Greater than 30 minutes  Signed: DEVINE, ALMA 02/12/2011, 10:11 AM

## 2011-02-13 LAB — GLUCOSE, CAPILLARY

## 2011-02-17 ENCOUNTER — Inpatient Hospital Stay (HOSPITAL_COMMUNITY)
Admission: EM | Admit: 2011-02-17 | Discharge: 2011-02-22 | DRG: 392 | Disposition: A | Discharge status: Home / Self Care | Payer: Medicare Other | Attending: Internal Medicine | Admitting: Internal Medicine

## 2011-02-17 ENCOUNTER — Emergency Department (HOSPITAL_COMMUNITY): Payer: Medicare Other

## 2011-02-17 ENCOUNTER — Encounter (HOSPITAL_COMMUNITY): Payer: Self-pay

## 2011-02-17 ENCOUNTER — Other Ambulatory Visit: Payer: Self-pay

## 2011-02-17 ENCOUNTER — Ambulatory Visit: Payer: Medicare Other | Admitting: Pulmonary Disease

## 2011-02-17 DIAGNOSIS — J309 Allergic rhinitis, unspecified: Secondary | ICD-10-CM

## 2011-02-17 DIAGNOSIS — Z950 Presence of cardiac pacemaker: Secondary | ICD-10-CM

## 2011-02-17 DIAGNOSIS — R1904 Left lower quadrant abdominal swelling, mass and lump: Secondary | ICD-10-CM | POA: Diagnosis not present

## 2011-02-17 DIAGNOSIS — J329 Chronic sinusitis, unspecified: Secondary | ICD-10-CM | POA: Diagnosis present

## 2011-02-17 DIAGNOSIS — K5909 Other constipation: Secondary | ICD-10-CM | POA: Diagnosis present

## 2011-02-17 DIAGNOSIS — E871 Hypo-osmolality and hyponatremia: Secondary | ICD-10-CM | POA: Diagnosis present

## 2011-02-17 DIAGNOSIS — F411 Generalized anxiety disorder: Secondary | ICD-10-CM | POA: Diagnosis present

## 2011-02-17 DIAGNOSIS — I10 Essential (primary) hypertension: Secondary | ICD-10-CM

## 2011-02-17 DIAGNOSIS — Z86718 Personal history of other venous thrombosis and embolism: Secondary | ICD-10-CM

## 2011-02-17 DIAGNOSIS — J9610 Chronic respiratory failure, unspecified whether with hypoxia or hypercapnia: Secondary | ICD-10-CM

## 2011-02-17 DIAGNOSIS — Z681 Body mass index (BMI) 19 or less, adult: Secondary | ICD-10-CM | POA: Diagnosis not present

## 2011-02-17 DIAGNOSIS — M792 Neuralgia and neuritis, unspecified: Secondary | ICD-10-CM

## 2011-02-17 DIAGNOSIS — I498 Other specified cardiac arrhythmias: Secondary | ICD-10-CM | POA: Diagnosis not present

## 2011-02-17 DIAGNOSIS — K2900 Acute gastritis without bleeding: Secondary | ICD-10-CM | POA: Diagnosis not present

## 2011-02-17 DIAGNOSIS — R079 Chest pain, unspecified: Secondary | ICD-10-CM

## 2011-02-17 DIAGNOSIS — K2970 Gastritis, unspecified, without bleeding: Secondary | ICD-10-CM | POA: Diagnosis present

## 2011-02-17 DIAGNOSIS — E860 Dehydration: Secondary | ICD-10-CM | POA: Diagnosis not present

## 2011-02-17 DIAGNOSIS — I471 Supraventricular tachycardia, unspecified: Secondary | ICD-10-CM

## 2011-02-17 DIAGNOSIS — R0789 Other chest pain: Secondary | ICD-10-CM | POA: Diagnosis present

## 2011-02-17 DIAGNOSIS — Z7982 Long term (current) use of aspirin: Secondary | ICD-10-CM | POA: Diagnosis not present

## 2011-02-17 DIAGNOSIS — Z888 Allergy status to other drugs, medicaments and biological substances status: Secondary | ICD-10-CM | POA: Diagnosis not present

## 2011-02-17 DIAGNOSIS — J438 Other emphysema: Secondary | ICD-10-CM | POA: Diagnosis not present

## 2011-02-17 DIAGNOSIS — R109 Unspecified abdominal pain: Secondary | ICD-10-CM | POA: Diagnosis not present

## 2011-02-17 DIAGNOSIS — E119 Type 2 diabetes mellitus without complications: Secondary | ICD-10-CM

## 2011-02-17 DIAGNOSIS — R55 Syncope and collapse: Secondary | ICD-10-CM

## 2011-02-17 DIAGNOSIS — Z91041 Radiographic dye allergy status: Secondary | ICD-10-CM | POA: Diagnosis not present

## 2011-02-17 DIAGNOSIS — J449 Chronic obstructive pulmonary disease, unspecified: Secondary | ICD-10-CM | POA: Diagnosis not present

## 2011-02-17 DIAGNOSIS — Z79899 Other long term (current) drug therapy: Secondary | ICD-10-CM

## 2011-02-17 DIAGNOSIS — IMO0001 Reserved for inherently not codable concepts without codable children: Secondary | ICD-10-CM

## 2011-02-17 DIAGNOSIS — K319 Disease of stomach and duodenum, unspecified: Secondary | ICD-10-CM | POA: Diagnosis not present

## 2011-02-17 DIAGNOSIS — R1013 Epigastric pain: Secondary | ICD-10-CM | POA: Diagnosis not present

## 2011-02-17 DIAGNOSIS — R410 Disorientation, unspecified: Secondary | ICD-10-CM

## 2011-02-17 DIAGNOSIS — J4489 Other specified chronic obstructive pulmonary disease: Secondary | ICD-10-CM | POA: Diagnosis present

## 2011-02-17 DIAGNOSIS — I519 Heart disease, unspecified: Secondary | ICD-10-CM

## 2011-02-17 DIAGNOSIS — K2990 Gastroduodenitis, unspecified, without bleeding: Principal | ICD-10-CM | POA: Diagnosis present

## 2011-02-17 LAB — DIFFERENTIAL
Eosinophils Relative: 2 % (ref 0–5)
Lymphocytes Relative: 27 % (ref 12–46)
Lymphs Abs: 1.2 10*3/uL (ref 0.7–4.0)
Monocytes Absolute: 0.3 10*3/uL (ref 0.1–1.0)
Monocytes Relative: 8 % (ref 3–12)
Neutro Abs: 2.8 10*3/uL (ref 1.7–7.7)

## 2011-02-17 LAB — CBC
HCT: 41.7 % (ref 36.0–46.0)
Hemoglobin: 14.3 g/dL (ref 12.0–15.0)
MCV: 94.3 fL (ref 78.0–100.0)
RBC: 4.42 MIL/uL (ref 3.87–5.11)
WBC: 4.5 10*3/uL (ref 4.0–10.5)

## 2011-02-17 LAB — BASIC METABOLIC PANEL
CO2: 29 mEq/L (ref 19–32)
Chloride: 91 mEq/L — ABNORMAL LOW (ref 96–112)
Creatinine, Ser: 0.53 mg/dL (ref 0.50–1.10)
Potassium: 5.2 mEq/L — ABNORMAL HIGH (ref 3.5–5.1)

## 2011-02-17 LAB — LIPASE, BLOOD: Lipase: 65 U/L — ABNORMAL HIGH (ref 11–59)

## 2011-02-17 MED ORDER — HYDROCODONE-HOMATROPINE 5-1.5 MG/5ML PO SYRP
5.0000 mL | ORAL_SOLUTION | ORAL | Status: DC | PRN
  Administered 2011-02-19 – 2011-02-22 (×4): 5 mL via ORAL
  Filled 2011-02-17 (×5): qty 5

## 2011-02-17 MED ORDER — PRAVASTATIN SODIUM 40 MG PO TABS
40.0000 mg | ORAL_TABLET | Freq: Every day | ORAL | Status: DC
  Administered 2011-02-17 – 2011-02-21 (×5): 40 mg via ORAL
  Filled 2011-02-17 (×6): qty 1

## 2011-02-17 MED ORDER — SODIUM CHLORIDE 0.9 % IV BOLUS (SEPSIS)
250.0000 mL | Freq: Once | INTRAVENOUS | Status: AC
  Administered 2011-02-17: 250 mL via INTRAVENOUS

## 2011-02-17 MED ORDER — ASPIRIN 81 MG PO TABS
81.0000 mg | ORAL_TABLET | Freq: Every day | ORAL | Status: DC
  Administered 2011-02-17 – 2011-02-21 (×5): 81 mg via ORAL
  Filled 2011-02-17 (×7): qty 1

## 2011-02-17 MED ORDER — METHOCARBAMOL 500 MG PO TABS
500.0000 mg | ORAL_TABLET | Freq: Three times a day (TID) | ORAL | Status: DC
  Administered 2011-02-17 – 2011-02-21 (×13): 500 mg via ORAL
  Filled 2011-02-17 (×16): qty 1

## 2011-02-17 MED ORDER — LEVALBUTEROL HCL 0.63 MG/3ML IN NEBU
0.6300 mg | INHALATION_SOLUTION | RESPIRATORY_TRACT | Status: DC
  Administered 2011-02-18: 0.63 mg via RESPIRATORY_TRACT
  Filled 2011-02-17 (×16): qty 3

## 2011-02-17 MED ORDER — ONDANSETRON HCL 4 MG/2ML IJ SOLN
4.0000 mg | Freq: Four times a day (QID) | INTRAMUSCULAR | Status: DC | PRN
  Administered 2011-02-17: 4 mg via INTRAVENOUS

## 2011-02-17 MED ORDER — MONTELUKAST SODIUM 10 MG PO TABS
10.0000 mg | ORAL_TABLET | Freq: Every day | ORAL | Status: DC
  Administered 2011-02-17 – 2011-02-21 (×5): 10 mg via ORAL
  Filled 2011-02-17 (×6): qty 1

## 2011-02-17 MED ORDER — ONDANSETRON HCL 4 MG/2ML IJ SOLN
INTRAMUSCULAR | Status: AC
  Filled 2011-02-17: qty 2

## 2011-02-17 MED ORDER — GABAPENTIN 300 MG PO CAPS
300.0000 mg | ORAL_CAPSULE | Freq: Two times a day (BID) | ORAL | Status: DC
  Administered 2011-02-17 – 2011-02-21 (×9): 300 mg via ORAL
  Filled 2011-02-17 (×11): qty 1

## 2011-02-17 MED ORDER — DILTIAZEM HCL ER COATED BEADS 180 MG PO CP24
180.0000 mg | ORAL_CAPSULE | Freq: Every day | ORAL | Status: DC
  Administered 2011-02-17 – 2011-02-21 (×5): 180 mg via ORAL
  Filled 2011-02-17 (×6): qty 1

## 2011-02-17 MED ORDER — SIMVASTATIN 20 MG PO TABS: 20.0000 mg | ORAL_TABLET | Freq: Every day | ORAL | Status: DC

## 2011-02-17 MED ORDER — IPRATROPIUM BROMIDE 0.02 % IN SOLN
0.5000 mg | RESPIRATORY_TRACT | Status: DC
  Administered 2011-02-18: 0.5 mg via RESPIRATORY_TRACT
  Filled 2011-02-17 (×3): qty 2.5

## 2011-02-17 MED ORDER — HYDROCODONE-ACETAMINOPHEN 5-325 MG PO TABS: 1.0000 | ORAL_TABLET | ORAL | Status: DC | PRN

## 2011-02-17 MED ORDER — OXYCODONE-ACETAMINOPHEN 5-325 MG PO TABS
1.0000 | ORAL_TABLET | Freq: Once | ORAL | Status: AC
  Administered 2011-02-17: 1 via ORAL
  Filled 2011-02-17: qty 1

## 2011-02-17 MED ORDER — ASPIRIN 81 MG PO CHEW
324.0000 mg | CHEWABLE_TABLET | Freq: Once | ORAL | Status: AC
  Administered 2011-02-17: 324 mg via ORAL
  Filled 2011-02-17: qty 4

## 2011-02-17 MED ORDER — MORPHINE SULFATE 2 MG/ML IJ SOLN
2.0000 mg | Freq: Once | INTRAMUSCULAR | Status: DC
  Filled 2011-02-17: qty 1
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
## 2011-02-17 NOTE — ED Notes (Signed)
Pt presents with 2 day h/o midsternal chest pain that has been constant and radiates through to her back.  +shortness of breath and nausea.  Pt reports cough x 3 weeks.

## 2011-02-17 NOTE — ED Notes (Signed)
Pt to ED for eval of R lower CP; pt reports that she has had it for 2 days, had SOB, and abd pain epigastric area; pt reports nausea but no vomiting; pt in NAD, reports pain as an aching feeling; reports that pain feels better when she is lying down, and there is no pressure on her abdomen
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
## 2011-02-18 DIAGNOSIS — R079 Chest pain, unspecified: Secondary | ICD-10-CM | POA: Diagnosis not present

## 2011-02-18 DIAGNOSIS — R1013 Epigastric pain: Secondary | ICD-10-CM | POA: Diagnosis not present

## 2011-02-18 DIAGNOSIS — J438 Other emphysema: Secondary | ICD-10-CM | POA: Diagnosis not present

## 2011-02-18 LAB — CBC
Hemoglobin: 11.8 g/dL — ABNORMAL LOW (ref 12.0–15.0)
MCH: 31.5 pg (ref 26.0–34.0)
MCHC: 33.1 g/dL (ref 30.0–36.0)
MCV: 95.2 fL (ref 78.0–100.0)

## 2011-02-18 LAB — CARDIAC PANEL(CRET KIN+CKTOT+MB+TROPI)
CK, MB: 3.8 ng/mL (ref 0.3–4.0)
Relative Index: INVALID (ref 0.0–2.5)
Relative Index: INVALID (ref 0.0–2.5)
Total CK: 28 U/L (ref 7–177)
Troponin I: <0.3 ng/mL (ref <0.30)
Troponin I: <0.3 ng/mL (ref <0.30)

## 2011-02-18 LAB — COMPREHENSIVE METABOLIC PANEL
ALT: 19 U/L (ref 0–35)
AST: 16 U/L (ref 0–37)
CO2: 28 mEq/L (ref 19–32)
Calcium: 8.6 mg/dL (ref 8.4–10.5)
Chloride: 93 mEq/L — ABNORMAL LOW (ref 96–112)
GFR calc non Af Amer: 89 mL/min — ABNORMAL LOW (ref >90)
Sodium: 129 mEq/L — ABNORMAL LOW (ref 135–145)

## 2011-02-18 NOTE — Progress Notes (Signed)
Subjective: Patient seen, and examined. Card cath on Monday. Chest pain is resolved. Still complains of epigastric pain.  Objective: Vital signs in last 24 hours: Temp:  [97.3 F (36.3 C)-97.9 F (36.6 C)] 97.3 F (36.3 C) (01/05 1520) Pulse Rate:  [70-91] 76  (01/05 1520) Resp:  [18-19] 19  (01/05 1520) BP: (108-129)/(60-85) 115/64 mmHg (01/05 1520) SpO2:  [96 %-100 %] 96 % (01/05 1520) FiO2 (%):  [28 %] 28 % (01/04 1910) Weight:  [43.4 kg (95 lb 10.9 oz)] 95 lb 10.9 oz (43.4 kg) (01/05 0557) Weight change:  Last BM Date: 02/17/11         Physical Exam: General: Alert, awake, oriented x3, in no acute distress. HEENT: No bruits, no goiter. Heart: Regular rate and rhythm, without murmurs, rubs, gallops. Lungs: Clear to auscultation bilaterally. Abdomen: Soft, nontender, nondistended, positive bowel sounds. Extremities: No clubbing cyanosis or edema with positive pedal pulses. Neuro: Grossly intact, nonfocal.    Lab Results: Basic Metabolic Panel:  Basename 02/18/11 0700 02/17/11 1533  NA 129* 129*  K 4.1 5.2*  CL 93* 91*  CO2 28 29  GLUCOSE 130* 127*  BUN 13 14  CREATININE 0.54 0.53  CALCIUM 8.6 9.5  MG -- --  PHOS -- --   Liver Function Tests:  Basename 02/18/11 0700  AST 16  ALT 19  ALKPHOS 70  BILITOT 0.3  PROT 6.3  ALBUMIN 2.9*    Basename 02/17/11 1917  LIPASE 65*  AMYLASE --   No results found for this basename: AMMONIA:2 in the last 72 hours CBC:  Basename 02/18/11 0700 02/17/11 1533  WBC 4.5 4.5  NEUTROABS -- 2.8  HGB 11.8* 14.3  HCT 35.7* 41.7  MCV 95.2 94.3  PLT 274 308   Cardiac Enzymes:  Basename 02/18/11 1554 02/18/11 1005 02/17/11 2351  CKTOTAL 26 28 28   CKMB 3.4 3.8 4.4*  CKMBINDEX -- -- --  TROPONINI <0.30 <0.30 <0.30    CBG:  Basename 02/18/11 1630 02/18/11 1128  GLUCAP 122* 121*   Urine Drug Screen: Drugs of Abuse     Component Value Date/Time   LABOPIA NONE DETECTED 01/02/2011 0232   COCAINSCRNUR NONE  DETECTED 01/02/2011 0232   LABBENZ NONE DETECTED 01/02/2011 0232   AMPHETMU NONE DETECTED 01/02/2011 0232   THCU NONE DETECTED 01/02/2011 0232   LABBARB NONE DETECTED 01/02/2011 0232     No results found for this or any previous visit (from the past 240 hour(s)).  Studies/Results: Dg Chest 2 View  02/17/2011  *RADIOLOGY REPORT*  Clinical Data: Low mid chest pain and mid abdominal pain.  Cough. Weakness.  COPD.  CHEST - 2 VIEW  Comparison: 02/10/2011.  Findings: The heart remains normal in size.  The lungs remain hyperexpanded with no significant change in mildly prominent interstitial markings.  Stable old, healed left mid clavicle fracture.  An electronic device in the left anterior chest wall is unchanged.  Mild thoracic spine degenerative changes.  IMPRESSION: Stable changes of COPD.  No acute abnormality.  Original Report Authenticated By: Darrol Angel, M.D.    Medications: Scheduled Meds:   . aspirin  81 mg Oral Daily  . diltiazem  180 mg Oral Daily  . gabapentin  300 mg Oral BID  . ipratropium  0.5 mg Nebulization Q4H  . levalbuterol  0.63 mg Nebulization Q4H  . methocarbamol  500 mg Oral TID  . montelukast  10 mg Oral Daily  . ondansetron      . pravastatin  40 mg  Oral Daily  . DISCONTD:  morphine injection  2 mg Intravenous Once  . DISCONTD: simvastatin  20 mg Oral q1800   Continuous Infusions:  PRN Meds:.HYDROcodone-acetaminophen, HYDROcodone-homatropine, ondansetron  Assessment/Plan:  Chest Pain:  Resolved. Card enzymes are negative. Card cath on Monday. Cardiology following. COPD  Stable. H/O SVT:  S/p ablation, HR controlled, continue cardizem.  Epigastric pain: Will get GI consult DVT Prophylaxis  SCD's.     LOS: 1 day   [facility] S [facility] Pager: [phone] 02/18/2011, 7:04 PM
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
## 2011-02-19 DIAGNOSIS — J438 Other emphysema: Secondary | ICD-10-CM | POA: Diagnosis not present

## 2011-02-19 DIAGNOSIS — R1013 Epigastric pain: Secondary | ICD-10-CM | POA: Diagnosis not present

## 2011-02-19 DIAGNOSIS — R079 Chest pain, unspecified: Secondary | ICD-10-CM | POA: Diagnosis not present

## 2011-02-19 LAB — CBC
Hemoglobin: 13.1 g/dL (ref 12.0–15.0)
MCH: 32.6 pg (ref 26.0–34.0)
MCHC: 34.7 g/dL (ref 30.0–36.0)
MCV: 93.8 fL (ref 78.0–100.0)
Platelets: 277 10*3/uL (ref 150–400)
RBC: 4.02 MIL/uL (ref 3.87–5.11)

## 2011-02-19 LAB — BASIC METABOLIC PANEL
BUN: 15 mg/dL (ref 6–23)
Calcium: 9.6 mg/dL (ref 8.4–10.5)
Chloride: 91 mEq/L — ABNORMAL LOW (ref 96–112)
Creatinine, Ser: 0.6 mg/dL (ref 0.50–1.10)
GFR calc Af Amer: >90 mL/min (ref >90)

## 2011-02-19 MED ORDER — PREDNISONE 50 MG PO TABS
60.0000 mg | ORAL_TABLET | Freq: Three times a day (TID) | ORAL | Status: AC
  Administered 2011-02-19 – 2011-02-20 (×3): 60 mg via ORAL
  Filled 2011-02-19 (×5): qty 1

## 2011-02-19 MED ORDER — SODIUM CHLORIDE 0.9 % IV SOLN
1.0000 mL/kg/h | INTRAVENOUS | Status: DC
  Administered 2011-02-19: 1 mL/kg/h via INTRAVENOUS

## 2011-02-19 MED ORDER — SODIUM CHLORIDE 0.9 % IV SOLN
250.0000 mL | INTRAVENOUS | Status: DC | PRN
  Administered 2011-02-22: 500 mL via INTRAVENOUS

## 2011-02-19 MED ORDER — FLUTICASONE PROPIONATE 50 MCG/ACT NA SUSP: 1.0000 | Freq: Every day | NASAL | Status: DC

## 2011-02-19 MED ORDER — FLUTICASONE PROPIONATE 50 MCG/ACT NA SUSP
2.0000 | Freq: Every day | NASAL | Status: DC
  Administered 2011-02-19 – 2011-02-21 (×3): 2 via NASAL
  Filled 2011-02-19: qty 16

## 2011-02-19 MED ORDER — ASPIRIN 81 MG PO CHEW
324.0000 mg | CHEWABLE_TABLET | ORAL | Status: AC
  Administered 2011-02-20: 324 mg via ORAL
  Filled 2011-02-19: qty 4

## 2011-02-19 MED ORDER — SODIUM CHLORIDE 0.9 % IJ SOLN: 3.0000 mL | INTRAMUSCULAR | Status: DC | PRN

## 2011-02-19 MED ORDER — DIPHENHYDRAMINE HCL 25 MG PO CAPS
25.0000 mg | ORAL_CAPSULE | Freq: Three times a day (TID) | ORAL | Status: AC
  Administered 2011-02-19 – 2011-02-20 (×3): 25 mg via ORAL
  Filled 2011-02-19 (×3): qty 1

## 2011-02-19 MED ORDER — SODIUM CHLORIDE 0.9 % IJ SOLN
3.0000 mL | Freq: Two times a day (BID) | INTRAMUSCULAR | Status: DC
  Administered 2011-02-19 – 2011-02-21 (×5): 3 mL via INTRAVENOUS

## 2011-02-19 MED ORDER — RANITIDINE HCL 150 MG PO TABS
150.0000 mg | ORAL_TABLET | Freq: Three times a day (TID) | ORAL | Status: AC
  Administered 2011-02-19 – 2011-02-20 (×3): 150 mg via ORAL
  Filled 2011-02-19 (×3): qty 1

## 2011-02-19 MED ORDER — BISACODYL 10 MG RE SUPP
10.0000 mg | Freq: Every day | RECTAL | Status: DC | PRN
  Administered 2011-02-19 – 2011-02-21 (×2): 10 mg via RECTAL
  Filled 2011-02-19 (×2): qty 1

## 2011-02-19 NOTE — Progress Notes (Signed)
Subjective: Patient seen, and examined. Card cath on Monday. Chest pain is resolved. Still complains of epigastric pain. Also complains of longstanding nasal congestion.  Objective: Vital signs in last 24 hours: Temp:  [97.3 F (36.3 C)-97.7 F (36.5 C)] 97.7 F (36.5 C) (01/06 0600) Pulse Rate:  [76-83] 81  (01/06 0600) Resp:  [18-19] 18  (01/06 0600) BP: (107-115)/(56-72) 109/72 mmHg (01/06 0600) SpO2:  [93 %-97 %] 93 % (01/06 0600) Weight:  [41.6 kg (91 lb 11.4 oz)] 91 lb 11.4 oz (41.6 kg) (01/06 0600) Weight change: -1.8 kg (-3 lb 15.5 oz) Last BM Date: 02/17/11   01/05 0701 - 01/06 0700 In: 1040 [P.O.:1040] Out: -  Total I/O In: 240 [P.O.:240] Out: -    Physical Exam: General: Alert, awake, oriented x3, in no acute distress. HEENT: No bruits, no goiter. Heart: Regular rate and rhythm, without murmurs, rubs, gallops. Lungs: Clear to auscultation bilaterally. Abdomen: Soft, nontender, nondistended, positive bowel sounds. Extremities: No clubbing cyanosis or edema with positive pedal pulses. Neuro: Grossly intact, nonfocal.    Lab Results: Basic Metabolic Panel:  Basename 02/18/11 0700 02/17/11 1533  NA 129* 129*  K 4.1 5.2*  CL 93* 91*  CO2 28 29  GLUCOSE 130* 127*  BUN 13 14  CREATININE 0.54 0.53  CALCIUM 8.6 9.5  MG -- --  PHOS -- --   Liver Function Tests:  Basename 02/18/11 0700  AST 16  ALT 19  ALKPHOS 70  BILITOT 0.3  PROT 6.3  ALBUMIN 2.9*    Basename 02/17/11 1917  LIPASE 65*  AMYLASE --   No results found for this basename: AMMONIA:2 in the last 72 hours CBC:  Basename 02/18/11 0700 02/17/11 1533  WBC 4.5 4.5  NEUTROABS -- 2.8  HGB 11.8* 14.3  HCT 35.7* 41.7  MCV 95.2 94.3  PLT 274 308   Cardiac Enzymes:  Basename 02/18/11 1554 02/18/11 1005 02/17/11 2351  CKTOTAL 26 28 28   CKMB 3.4 3.8 4.4*  CKMBINDEX -- -- --  TROPONINI <0.30 <0.30 <0.30    CBG:  Basename 02/18/11 1630 02/18/11 1128  GLUCAP 122* 121*   Urine  Drug Screen: Drugs of Abuse     Component Value Date/Time   LABOPIA NONE DETECTED 01/02/2011 0232   COCAINSCRNUR NONE DETECTED 01/02/2011 0232   LABBENZ NONE DETECTED 01/02/2011 0232   AMPHETMU NONE DETECTED 01/02/2011 0232   THCU NONE DETECTED 01/02/2011 0232   LABBARB NONE DETECTED 01/02/2011 0232     No results found for this or any previous visit (from the past 240 hour(s)).  Studies/Results: Dg Chest 2 View  02/17/2011  *RADIOLOGY REPORT*  Clinical Data: Low mid chest pain and mid abdominal pain.  Cough. Weakness.  COPD.  CHEST - 2 VIEW  Comparison: 02/10/2011.  Findings: The heart remains normal in size.  The lungs remain hyperexpanded with no significant change in mildly prominent interstitial markings.  Stable old, healed left mid clavicle fracture.  An electronic device in the left anterior chest wall is unchanged.  Mild thoracic spine degenerative changes.  IMPRESSION: Stable changes of COPD.  No acute abnormality.  Original Report Authenticated By: Darrol Angel, M.D.    Medications: Scheduled Meds:    . aspirin  81 mg Oral Daily  . diltiazem  180 mg Oral Daily  . fluticasone  2 spray Each Nare Daily  . gabapentin  300 mg Oral BID  . ipratropium  0.5 mg Nebulization Q4H  . methocarbamol  500 mg Oral TID  .  montelukast  10 mg Oral Daily  . pravastatin  40 mg Oral Daily  . DISCONTD: fluticasone  1 spray Each Nare Daily  . DISCONTD: levalbuterol  0.63 mg Nebulization Q4H   Continuous Infusions:  PRN Meds:.bisacodyl, HYDROcodone-acetaminophen, HYDROcodone-homatropine, ondansetron  CT abdomen: Bibasilar scarring with mild airway plugging in the left lower  lobe.  2. Scattered pelvic phleboliths, some of which in the direct  vicinity of the distal ureters, but given the lack of hydroureter I  would doubt that these represent ureteral calculi.  3. Rim calcified cystic lesion posterior to the ascending colon is  likely retroperitoneal, along the lateral margin of the  psoas  muscle. Differential diagnostic considerations include cystic  lymphangioma, mucinous cystadenoma, none pancreatic pseudocyst,  cystic mesothelioma, the layering cysts, or less likely epidermoid  cyst.  Mucinous neoplasm cannot be entirely excluded.  4. Old, healed left pelvic fractures.  5. Severe degenerative arthropathy left hip, asymmetric and  possibly post-traumatic.  6. Small focus of capsular calcification or small flat hemorrhagic  cyst node along the right kidney upper pole.  7. Low-level mesenteric edema, etiology uncertain.  8. The appendix is not visualized. This lowers negative  predictive value for acute appendicitis.  Assessment/Plan:  Chest Pain:  Resolved. Card enzymes are negative. Card cath on Monday. Cardiology following.  COPD  Stable.  H/O SVT:  S/p ablation, HR controlled, continue cardizem.   Epigastric pain: Will get GI consult  Chronic nasal congestion Flonase  Hyponatremia: Check urine and serum osmolality   DVT Prophylaxis  SCD's.     LOS: 2 days   [facility] Pager: [phone] 02/19/2011, 12:44 PM
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
## 2011-02-20 ENCOUNTER — Encounter (HOSPITAL_COMMUNITY)
Admission: EM | Disposition: A | Discharge status: Home / Self Care | Payer: Self-pay | Source: Home / Self Care | Attending: Family Medicine

## 2011-02-20 DIAGNOSIS — R1904 Left lower quadrant abdominal swelling, mass and lump: Secondary | ICD-10-CM | POA: Diagnosis not present

## 2011-02-20 DIAGNOSIS — R1013 Epigastric pain: Secondary | ICD-10-CM | POA: Diagnosis not present

## 2011-02-20 DIAGNOSIS — J438 Other emphysema: Secondary | ICD-10-CM | POA: Diagnosis not present

## 2011-02-20 DIAGNOSIS — R079 Chest pain, unspecified: Secondary | ICD-10-CM | POA: Diagnosis not present

## 2011-02-20 DIAGNOSIS — K2900 Acute gastritis without bleeding: Secondary | ICD-10-CM | POA: Diagnosis not present

## 2011-02-20 HISTORY — PX: LEFT HEART CATHETERIZATION WITH CORONARY ANGIOGRAM: SHX5451

## 2011-02-20 LAB — GLUCOSE, CAPILLARY: Glucose-Capillary: 132 mg/dL — ABNORMAL HIGH (ref 70–99)

## 2011-02-20 SURGERY — LEFT HEART CATHETERIZATION WITH CORONARY ANGIOGRAM
Anesthesia: Choice

## 2011-02-20 MED ORDER — HEPARIN (PORCINE) IN NACL 2-0.9 UNIT/ML-% IJ SOLN
INTRAMUSCULAR | Status: AC
  Filled 2011-02-20: qty 2000

## 2011-02-20 MED ORDER — LIDOCAINE HCL (PF) 1 % IJ SOLN
INTRAMUSCULAR | Status: AC
  Filled 2011-02-20: qty 30

## 2011-02-20 MED ORDER — DIPHENHYDRAMINE HCL 50 MG/ML IJ SOLN
INTRAMUSCULAR | Status: AC
  Filled 2011-02-20: qty 1

## 2011-02-20 MED ORDER — SODIUM CHLORIDE 0.9 % IV SOLN: INTRAVENOUS | Status: AC

## 2011-02-20 MED ORDER — FENTANYL CITRATE 0.05 MG/ML IJ SOLN
INTRAMUSCULAR | Status: AC
  Filled 2011-02-20: qty 2

## 2011-02-20 MED ORDER — MIDAZOLAM HCL 2 MG/2ML IJ SOLN
INTRAMUSCULAR | Status: AC
  Filled 2011-02-20: qty 2

## 2011-02-20 MED ORDER — NITROGLYCERIN 0.2 MG/ML ON CALL CATH LAB
INTRAVENOUS | Status: AC
  Filled 2011-02-20: qty 1

## 2011-02-20 NOTE — Progress Notes (Signed)
Patient very unsteady while ambulating.  Refuses assistance.  States she has been independent all her life and does not need assistance.  Instructed patient to call for help while ambulating.

## 2011-02-20 NOTE — Progress Notes (Signed)
Utilization Review Completed.  [name]  02/20/2011 

## 2011-02-20 NOTE — Progress Notes (Signed)
Utilization Review Completed.[name] T1/08/2011   

## 2011-02-20 NOTE — H&P (View-Only) (Signed)
Patient ID: [name], female   DOB: 08/10/1931, 78 y.o.   MRN: [id number] Subjective:  No chest pain or sob. Objective:  Vital Signs in the last 24 hours: Temp:  [97.6 F (36.4 C)-97.8 F (36.6 C)] 97.6 F (36.4 C) (01/07 0600) Pulse Rate:  [81-87] 81  (01/07 0600) Resp:  [16] 16  (01/07 0600) BP: (132-144)/(74-79) 132/79 mmHg (01/07 0600) SpO2:  [90 %-99 %] 99 % (01/07 0600) Weight:  [48 kg (105 lb 13.1 oz)] 105 lb 13.1 oz (48 kg) (01/07 0600)  Intake/Output from previous day: 01/06 0701 - 01/07 0700 In: 1624.7 [P.O.:1260; I.V.:364.7] Out: 450 [Urine:450] Intake/Output from this shift: Total I/O In: 507.6 [P.O.:50; I.V.:457.6] Out: 450 [Urine:450]  Physical Exam: Well appearing NAD HEENT: Unremarkable Neck:  No JVD, no thyromegally Lymphatics:  No adenopathy Back:  No CVA tenderness Lungs:  Clear with no wheezes. HEART:  Regular rate rhythm, no murmurs, no rubs, no clicks Abd:  Flat, positive bowel sounds, no organomegally, no rebound, no guarding Ext:  2 plus pulses, no edema, no cyanosis, no clubbing Skin:  No rashes no nodules Neuro:  CN II through XII intact, motor grossly intact  Lab Results:  Basename 02/19/11 1544 02/18/11 0700  WBC 4.5 4.5  HGB 13.1 11.8*  PLT 277 274    Basename 02/19/11 1544 02/18/11 0700  NA 131* 129*  K 4.9 4.1  CL 91* 93*  CO2 30 28  GLUCOSE 105* 130*  BUN 15 13  CREATININE 0.60 0.54    Basename 02/18/11 1554 02/18/11 1005  TROPONINI <0.30 <0.30   Hepatic Function Panel  Basename 02/18/11 0700  PROT 6.3  ALBUMIN 2.9*  AST 16  ALT 19  ALKPHOS 70  BILITOT 0.3  BILIDIR --  IBILI --   No results found for this basename: CHOL in the last 72 hours No results found for this basename: PROTIME in the last 72 hours  Imaging: No results found.  Cardiac Studies: NSR Assessment/Plan:  1. Chest pain - she awaits left heart cath as she has fairly typical anginal symptoms. 2. COPD - her dyspnea is improved. She will  continue her current meds. 3. HTN - her blood pressure is controlled on her current meds.  LOS: 3 days    [name] 02/20/2011, 6:15 PM     

## 2011-02-20 NOTE — Progress Notes (Signed)
Patient ID: [name], female   DOB: 22-Feb-1931, 78 y.o.   MRN: [id number] Subjective:  No chest pain or sob. Objective:  Vital Signs in the last 24 hours: Temp:  [97.6 F (36.4 C)-97.8 F (36.6 C)] 97.6 F (36.4 C) (01/07 0600) Pulse Rate:  [81-87] 81  (01/07 0600) Resp:  [16] 16  (01/07 0600) BP: (132-144)/(74-79) 132/79 mmHg (01/07 0600) SpO2:  [90 %-99 %] 99 % (01/07 0600) Weight:  [48 kg (105 lb 13.1 oz)] 105 lb 13.1 oz (48 kg) (01/07 0600)  Intake/Output from previous day: 01/06 0701 - 01/07 0700 In: 1624.7 [P.O.:1260; I.V.:364.7] Out: 450 [Urine:450] Intake/Output from this shift: Total I/O In: 507.6 [P.O.:50; I.V.:457.6] Out: 450 [Urine:450]  Physical Exam: Well appearing NAD HEENT: Unremarkable Neck:  No JVD, no thyromegally Lymphatics:  No adenopathy Back:  No CVA tenderness Lungs:  Clear with no wheezes. HEART:  Regular rate rhythm, no murmurs, no rubs, no clicks Abd:  Flat, positive bowel sounds, no organomegally, no rebound, no guarding Ext:  2 plus pulses, no edema, no cyanosis, no clubbing Skin:  No rashes no nodules Neuro:  CN II through XII intact, motor grossly intact  Lab Results:  Basename 02/19/11 1544 02/18/11 0700  WBC 4.5 4.5  HGB 13.1 11.8*  PLT 277 274    Basename 02/19/11 1544 02/18/11 0700  NA 131* 129*  K 4.9 4.1  CL 91* 93*  CO2 30 28  GLUCOSE 105* 130*  BUN 15 13  CREATININE 0.60 0.54    Basename 02/18/11 1554 02/18/11 1005  TROPONINI <0.30 <0.30   Hepatic Function Panel  Basename 02/18/11 0700  PROT 6.3  ALBUMIN 2.9*  AST 16  ALT 19  ALKPHOS 70  BILITOT 0.3  BILIDIR --  IBILI --   No results found for this basename: CHOL in the last 72 hours No results found for this basename: PROTIME in the last 72 hours  Imaging: No results found.  Cardiac Studies: NSR Assessment/Plan:  1. Chest pain - she awaits left heart cath as she has fairly typical anginal symptoms. 2. COPD - her dyspnea is improved. She will  continue her current meds. 3. HTN - her blood pressure is controlled on her current meds.  LOS: 3 days    [name] 02/20/2011, 6:15 PM
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
## 2011-02-20 NOTE — Interval H&P Note (Signed)
History and Physical Interval Note:  02/20/2011 6:45 PM  The patient is here today for a left heart catheterization. I have personally reviewed the labs and examined the patient. The History and Physical has been reviewed and there are no changes. The risks and benefits of the procedure have been reviewed with the patient. Informed consent has been signed by the patient and is present in the chart. The patient agrees to proceed with the procedure.   MCALHANY,CHRISTOPHER

## 2011-02-20 NOTE — Progress Notes (Signed)
Subjective: Patient seen, and examined. Card cath today. [facility] has seen her in past. Objective: Vital signs in last 24 hours: Temp:  [97.6 F (36.4 C)-97.8 F (36.6 C)] 97.6 F (36.4 C) (01/07 0600) Pulse Rate:  [81-87] 81  (01/07 0600) Resp:  [16] 16  (01/07 0600) BP: (132-144)/(74-79) 132/79 mmHg (01/07 0600) SpO2:  [90 %-99 %] 99 % (01/07 0600) Weight:  [48 kg (105 lb 13.1 oz)] 105 lb 13.1 oz (48 kg) (01/07 0600) Weight change: 6.4 kg (14 lb 1.8 oz) Last BM Date: 02/19/11   01/06 0701 - 01/07 0700 In: 1624.7 [P.O.:1260; I.V.:364.7] Out: 450 [Urine:450] Total I/O In: 0  Out: 450 [Urine:450]   Physical Exam: General: Alert, awake, oriented x3, in no acute distress. HEENT: No bruits, no goiter. Heart: Regular rate and rhythm, without murmurs, rubs, gallops. Lungs: Clear to auscultation bilaterally. Abdomen: Soft, nontender, nondistended, positive bowel sounds. Extremities: No clubbing cyanosis or edema with positive pedal pulses. Neuro: Grossly intact, nonfocal.    Lab Results: Basic Metabolic Panel:  Basename 02/19/11 1544 02/18/11 0700  NA 131* 129*  K 4.9 4.1  CL 91* 93*  CO2 30 28  GLUCOSE 105* 130*  BUN 15 13  CREATININE 0.60 0.54  CALCIUM 9.6 8.6  MG -- --  PHOS -- --   Liver Function Tests:  Basename 02/18/11 0700  AST 16  ALT 19  ALKPHOS 70  BILITOT 0.3  PROT 6.3  ALBUMIN 2.9*    Basename 02/17/11 1917  LIPASE 65*  AMYLASE --   No results found for this basename: AMMONIA:2 in the last 72 hours CBC:  Basename 02/19/11 1544 02/18/11 0700  WBC 4.5 4.5  NEUTROABS -- --  HGB 13.1 11.8*  HCT 37.7 35.7*  MCV 93.8 95.2  PLT 277 274   Cardiac Enzymes:  Basename 02/18/11 1554 02/18/11 1005 02/17/11 2351  CKTOTAL 26 28 28   CKMB 3.4 3.8 4.4*  CKMBINDEX -- -- --  TROPONINI <0.30 <0.30 <0.30    CBG:  Basename 02/20/11 1110 02/18/11 1630 02/18/11 1128  GLUCAP 161* 122* 121*   Urine Drug Screen: Drugs of Abuse     Component  Value Date/Time   LABOPIA NONE DETECTED 01/02/2011 0232   COCAINSCRNUR NONE DETECTED 01/02/2011 0232   LABBENZ NONE DETECTED 01/02/2011 0232   AMPHETMU NONE DETECTED 01/02/2011 0232   THCU NONE DETECTED 01/02/2011 0232   LABBARB NONE DETECTED 01/02/2011 0232     No results found for this or any previous visit (from the past 240 hour(s)).  Studies/Results: No results found.  Medications: Scheduled Meds:    . aspirin  324 mg Oral Pre-Cath  . aspirin  81 mg Oral Daily  . diltiazem  180 mg Oral Daily  . diphenhydrAMINE  25 mg Oral TID  . fluticasone  2 spray Each Nare Daily  . gabapentin  300 mg Oral BID  . ipratropium  0.5 mg Nebulization Q4H  . methocarbamol  500 mg Oral TID  . montelukast  10 mg Oral Daily  . pravastatin  40 mg Oral Daily  . predniSONE  60 mg Oral TID  . ranitidine  150 mg Oral TID  . sodium chloride  3 mL Intravenous Q12H   Continuous Infusions:    . sodium chloride 1 mL/kg/hr (02/19/11 2114)   PRN Meds:.sodium chloride, bisacodyl, HYDROcodone-acetaminophen, HYDROcodone-homatropine, ondansetron, sodium chloride  CT abdomen: Bibasilar scarring with mild airway plugging in the left lower  lobe.  2. Scattered pelvic phleboliths, some of which in the  direct  vicinity of the distal ureters, but given the lack of hydroureter I  would doubt that these represent ureteral calculi.  3. Rim calcified cystic lesion posterior to the ascending colon is  likely retroperitoneal, along the lateral margin of the psoas  muscle. Differential diagnostic considerations include cystic  lymphangioma, mucinous cystadenoma, none pancreatic pseudocyst,  cystic mesothelioma, the layering cysts, or less likely epidermoid  cyst.  Mucinous neoplasm cannot be entirely excluded.  4. Old, healed left pelvic fractures.  5. Severe degenerative arthropathy left hip, asymmetric and  possibly post-traumatic.  6. Small focus of capsular calcification or small flat hemorrhagic  cyst  node along the right kidney upper pole.  7. Low-level mesenteric edema, etiology uncertain.  8. The appendix is not visualized. This lowers negative  predictive value for acute appendicitis.  Assessment/Plan:  Chest Pain:  Resolved. Card enzymes are negative. Card cath today. Cardiology following.  COPD  Stable.  H/O SVT:  S/p ablation, HR controlled, continue cardizem.   Epigastric pain: Will get GI consult Has seen [facility] in past. Will call [facility].  Chronic nasal congestion Flonase  Hyponatremia: Check urine and serum osmolality   DVT Prophylaxis  SCD's.     LOS: 3 days   [facility] Pager: [phone] 02/20/2011, 4:15 PM

## 2011-02-20 NOTE — Op Note (Signed)
Cardiac Catheterization Operative Report  [name] [id number] 1/7/20137:12 PM FULP,CAMMIE J, MD  Procedure Performed:  1. Left Heart Catheterization 2. Selective Coronary Angiography 3. Left ventricular angiogram  Operator: Verne Carrow, MD  Indication: Chest pain.                                      Procedure Details: The risks, benefits, complications, treatment options, and expected outcomes were discussed with the patient. The patient and/or family concurred with the proposed plan, giving informed consent. The patient was brought to the cath lab after IV hydration was begun and oral premedication was given. The patient was further sedated with Versed and Fentanyl. The right groin was prepped and draped in the usual manner. Using the modified Seldinger access technique, a 5 French sheath was placed in the right femoral artery. Standard diagnostic catheters were used to perform selective coronary angiography. A pigtail catheter was used to perform a left ventricular angiogram.  There were no immediate complications. The patient was taken to the recovery area in stable condition.   Hemodynamic Findings: Central aortic pressure: 136/65 Left ventricular pressure: 130/2/5  Angiographic Findings:  Left main:  No evidence of disease.  Left Anterior Descending Artery: Large vessel that courses to the apex. No evidence of disease.  Circumflex Artery: Large, dominant vessel. Large OM1. No evidence of disease.  Right Coronary Artery: Small, non-dominant vessel. No evidence of disease.  Left Ventricular Angiogram: LVEF 55-60%. Normal wall motion.   Impression: 1. No angiographic evidence of CAD. 2. Normal LV systolic function.  3. Non-cardiac chest pain.   Recommendations: No further cardiac workup.        Complications:  None. The patient tolerated the procedure well.

## 2011-02-21 ENCOUNTER — Encounter (HOSPITAL_COMMUNITY): Payer: Self-pay

## 2011-02-21 DIAGNOSIS — K2900 Acute gastritis without bleeding: Secondary | ICD-10-CM | POA: Diagnosis not present

## 2011-02-21 DIAGNOSIS — R1013 Epigastric pain: Secondary | ICD-10-CM | POA: Diagnosis not present

## 2011-02-21 DIAGNOSIS — R079 Chest pain, unspecified: Secondary | ICD-10-CM | POA: Diagnosis not present

## 2011-02-21 DIAGNOSIS — J438 Other emphysema: Secondary | ICD-10-CM | POA: Diagnosis not present

## 2011-02-21 DIAGNOSIS — R1904 Left lower quadrant abdominal swelling, mass and lump: Secondary | ICD-10-CM | POA: Diagnosis not present

## 2011-02-21 MED ORDER — IPRATROPIUM BROMIDE 0.02 % IN SOLN
0.5000 mg | Freq: Three times a day (TID) | RESPIRATORY_TRACT | Status: DC
  Filled 2011-02-21 (×2): qty 2.5

## 2011-02-21 NOTE — Progress Notes (Signed)
[facility] PROGRESS NOTE Subjective Cath was negative no significant CAD.  Still with EG pain.  Objective: Vital signs in last 24 hours: Temp:  [97.6 F (36.4 C)-98.1 F (36.7 C)] 98.1 F (36.7 C) (01/08 1400) Pulse Rate:  [75-108] 85  (01/08 1400) Resp:  [17-20] 17  (01/08 1400) BP: (116-143)/(64-91) 119/64 mmHg (01/08 1400) SpO2:  [95 %-98 %] 98 % (01/08 1400) Weight:  [49.6 kg (109 lb 5.6 oz)] 109 lb 5.6 oz (49.6 kg) (01/08 0623) Last BM Date: 02/19/11  Intake/Output from previous day: 01/07 0701 - 01/08 0700 In: 1261.4 [P.O.:110; I.V.:1151.4] Out: 1150 [Urine:1150] Intake/Output this shift: Total I/O In: 1093 [P.O.:1090; I.V.:3] Out: -   PE:Abd-still with EG tenderness  Lab Results:  Basename 02/19/11 1544  WBC 4.5  HGB 13.1  HCT 37.7  PLT 277   BMET  Basename 02/19/11 1544  NA 131*  K 4.9  CL 91*  CO2 30  CREATININE 0.60   LFT No results found for this basename: PROT:3ALBUMIN:3,AST:3,ALT:3,ALKPHOS:3,BILITOT:3,BILIDIR:3,IBILI:3 in the last 72 hours PT/INR  Basename 02/19/11 1544  LABPROT 12.7  INR 0.93   Hepatitis Panel No results found for this basename: HEPBSAG,HCVAB,HEPAIGM,HEPBIGM in the last 72 hours     Studies/Results: No results found.  Medications: I have reviewed the patient's current medications.  Assessment/Plan: 1. EG pain. ? Cause pt willing to undergo EGD tomorrow planned at 10 am discussed with pt.   Felice Deem JR,Celinda Dethlefs L 02/21/2011, 5:59 PM

## 2011-02-21 NOTE — Progress Notes (Signed)
Subjective: 78 year old female who has a history of chronic epigastric pain was admitted with chest pain rule out ACS. Cardiac enzymes were negative, and she was seen by cardiology and underwent cardiac cath is negative. Patient has also been seen by [facility], Dr. Randa Evens and and the plan is to do upper GI endoscopy. Patient had a recent CT abdomen pelvis done on 02/10/2011 which showed rim calcified cystic lesion posterior to the ascending colon. And also has some mesenteric edema. Discussed with patient at this time he she would like to wait and watch  the findings of CT and would like to repeat CT in 3-6 months as outpatient. Objective: Vital signs in last 24 hours: Temp:  [97.6 F (36.4 C)-98.1 F (36.7 C)] 98.1 F (36.7 C) (01/08 1400) Pulse Rate:  [75-108] 85  (01/08 1400) Resp:  [17-20] 17  (01/08 1400) BP: (116-143)/(64-91) 119/64 mmHg (01/08 1400) SpO2:  [95 %-98 %] 98 % (01/08 1400) Weight:  [49.6 kg (109 lb 5.6 oz)] 109 lb 5.6 oz (49.6 kg) (01/08 1610) Weight change: 1.6 kg (3 lb 8.4 oz) Last BM Date: 02/19/11   01/07 0701 - 01/08 0700 In: 1261.4 [P.O.:110; I.V.:1151.4] Out: 1150 [Urine:1150] Total I/O In: 1093 [P.O.:1090; I.V.:3] Out: -    Physical Exam: General: Alert, awake, oriented x3, in no acute distress. HEENT: No bruits, no goiter. Heart: Regular rate and rhythm, without murmurs, rubs, gallops. Lungs: Clear to auscultation bilaterally. Abdomen: Soft, nontender, nondistended, positive bowel sounds. Extremities: No clubbing cyanosis or edema with positive pedal pulses. Neuro: Grossly intact, nonfocal.    Lab Results: Basic Metabolic Panel:  Basename 02/19/11 1544  NA 131*  K 4.9  CL 91*  CO2 30  GLUCOSE 105*  BUN 15  CREATININE 0.60  CALCIUM 9.6  MG --  PHOS --   Liver Function Tests: No results found for this basename: AST:2,ALT:2,ALKPHOS:2,BILITOT:2,PROT:2,ALBUMIN:2 in the last 72 hours No results found for this basename: LIPASE:2,AMYLASE:2 in  the last 72 hours No results found for this basename: AMMONIA:2 in the last 72 hours CBC:  Basename 02/19/11 1544  WBC 4.5  NEUTROABS --  HGB 13.1  HCT 37.7  MCV 93.8  PLT 277   Cardiac Enzymes: No results found for this basename: CKTOTAL:3,CKMB:3,CKMBINDEX:3,TROPONINI:3 in the last 72 hours  CBG:  Basename 02/20/11 1928 02/20/11 1622 02/20/11 1110  GLUCAP 120* 132* 161*   Urine Drug Screen: Drugs of Abuse     Component Value Date/Time   LABOPIA NONE DETECTED 01/02/2011 0232   COCAINSCRNUR NONE DETECTED 01/02/2011 0232   LABBENZ NONE DETECTED 01/02/2011 0232   AMPHETMU NONE DETECTED 01/02/2011 0232   THCU NONE DETECTED 01/02/2011 0232   LABBARB NONE DETECTED 01/02/2011 0232     No results found for this or any previous visit (from the past 240 hour(s)).  Studies/Results: No results found.  Medications: Scheduled Meds:    . aspirin  81 mg Oral Daily  . diltiazem  180 mg Oral Daily  . diphenhydrAMINE      . fentaNYL      . fluticasone  2 spray Each Nare Daily  . gabapentin  300 mg Oral BID  . heparin      . ipratropium  0.5 mg Nebulization TID  . lidocaine      . methocarbamol  500 mg Oral TID  . midazolam      . montelukast  10 mg Oral Daily  . nitroGLYCERIN      . pravastatin  40 mg Oral Daily  .  sodium chloride  3 mL Intravenous Q12H  . DISCONTD: ipratropium  0.5 mg Nebulization Q4H   Continuous Infusions:    . sodium chloride Stopped (02/21/11 0233)  . DISCONTD: sodium chloride 1 mL/kg/hr (02/19/11 2114)   PRN Meds:.sodium chloride, bisacodyl, HYDROcodone-acetaminophen, HYDROcodone-homatropine, ondansetron, sodium chloride  CT abdomen: Bibasilar scarring with mild airway plugging in the left lower  lobe.  2. Scattered pelvic phleboliths, some of which in the direct  vicinity of the distal ureters, but given the lack of hydroureter I  would doubt that these represent ureteral calculi.  3. Rim calcified cystic lesion posterior to the ascending  colon is  likely retroperitoneal, along the lateral margin of the psoas  muscle. Differential diagnostic considerations include cystic  lymphangioma, mucinous cystadenoma, none pancreatic pseudocyst,  cystic mesothelioma, the layering cysts, or less likely epidermoid  cyst.  Mucinous neoplasm cannot be entirely excluded.  4. Old, healed left pelvic fractures.  5. Severe degenerative arthropathy left hip, asymmetric and  possibly post-traumatic.  6. Small focus of capsular calcification or small flat hemorrhagic  cyst node along the right kidney upper pole.  7. Low-level mesenteric edema, etiology uncertain.  8. The appendix is not visualized. This lowers negative  predictive value for acute appendicitis.  Assessment/Plan:  Chest Pain:  Resolved. Card enzymes are negative. Card cath negative   COPD  Stable.  H/O SVT:  S/p ablation, HR controlled, continue cardizem.   Epigastric pain: ? Cause Mild elevation of lipase. No nausea vomiting at this time Dr Randa Evens following Plan is ugi endoscopy  Chronic nasal congestion Flonase  Hyponatremia: Follow urine and serum osmolality Will check bmp in am  Abnormal CT abdomen findings  Discussed with patient at this time he she would like to wait and watch  the findings of CT and would like to repeat CT in 3-6 months as outpatient.   DVT Prophylaxis  SCD's.     LOS: 4 days   [facility] Pager: [phone] 02/21/2011, 5:23 PM

## 2011-02-21 NOTE — Progress Notes (Signed)
02/21/11 1000 Nursing Note: Pt's right groin site stable. Level 1  With no bleeding present. Pt does not complain of pain to right groin site. Pt ambulated and site stable after ambulation level 1 with no bleeding present dressing clean dry and intact.. Will continue to monitor pt. George Hugh RN

## 2011-02-22 ENCOUNTER — Encounter (HOSPITAL_COMMUNITY)
Admission: EM | Disposition: A | Discharge status: Home / Self Care | Payer: Self-pay | Source: Home / Self Care | Attending: Family Medicine

## 2011-02-22 ENCOUNTER — Other Ambulatory Visit: Payer: Self-pay | Admitting: Gastroenterology

## 2011-02-22 ENCOUNTER — Encounter (HOSPITAL_COMMUNITY): Payer: Self-pay | Admitting: *Deleted

## 2011-02-22 DIAGNOSIS — R1013 Epigastric pain: Secondary | ICD-10-CM | POA: Diagnosis not present

## 2011-02-22 DIAGNOSIS — K319 Disease of stomach and duodenum, unspecified: Secondary | ICD-10-CM | POA: Diagnosis not present

## 2011-02-22 DIAGNOSIS — K2900 Acute gastritis without bleeding: Secondary | ICD-10-CM | POA: Diagnosis not present

## 2011-02-22 DIAGNOSIS — J438 Other emphysema: Secondary | ICD-10-CM | POA: Diagnosis not present

## 2011-02-22 DIAGNOSIS — R079 Chest pain, unspecified: Secondary | ICD-10-CM | POA: Diagnosis not present

## 2011-02-22 DIAGNOSIS — R1904 Left lower quadrant abdominal swelling, mass and lump: Secondary | ICD-10-CM | POA: Diagnosis not present

## 2011-02-22 HISTORY — PX: ESOPHAGOGASTRODUODENOSCOPY: SHX5428

## 2011-02-22 LAB — CBC
Platelets: 246 10*3/uL (ref 150–400)
RBC: 3.6 MIL/uL — ABNORMAL LOW (ref 3.87–5.11)
WBC: 4.2 10*3/uL (ref 4.0–10.5)

## 2011-02-22 LAB — BASIC METABOLIC PANEL
CO2: 29 mEq/L (ref 19–32)
Chloride: 99 mEq/L (ref 96–112)
Sodium: 135 mEq/L (ref 135–145)

## 2011-02-22 SURGERY — EGD (ESOPHAGOGASTRODUODENOSCOPY)
Anesthesia: Moderate Sedation | Site: Esophagus | Wound class: Clean

## 2011-02-22 SURGERY — EGD (ESOPHAGOGASTRODUODENOSCOPY)
Anesthesia: Moderate Sedation

## 2011-02-22 MED ORDER — BUTAMBEN-TETRACAINE-BENZOCAINE 2-2-14 % EX AERO
INHALATION_SPRAY | CUTANEOUS | Status: DC | PRN
  Administered 2011-02-22: 2 via TOPICAL

## 2011-02-22 MED ORDER — FENTANYL NICU IV SYRINGE 50 MCG/ML
INJECTION | INTRAMUSCULAR | Status: DC | PRN
  Administered 2011-02-22: 25 ug via INTRAVENOUS

## 2011-02-22 MED ORDER — FENTANYL CITRATE 0.05 MG/ML IJ SOLN
INTRAMUSCULAR | Status: AC
  Filled 2011-02-22: qty 2

## 2011-02-22 MED ORDER — MIDAZOLAM HCL 10 MG/2ML IJ SOLN
INTRAMUSCULAR | Status: DC | PRN
  Administered 2011-02-22: 2 mg via INTRAVENOUS

## 2011-02-22 MED ORDER — MIDAZOLAM HCL 10 MG/2ML IJ SOLN
INTRAMUSCULAR | Status: AC
  Filled 2011-02-22: qty 2

## 2011-02-22 MED ORDER — SODIUM CHLORIDE 0.9 % IV SOLN
Freq: Once | INTRAVENOUS | Status: AC
  Administered 2011-02-22: 500 mL via INTRAVENOUS

## 2011-02-22 MED ORDER — OMEPRAZOLE 20 MG PO CPDR: 20.0000 mg | DELAYED_RELEASE_CAPSULE | Freq: Two times a day (BID) | ORAL | Status: DC

## 2011-02-22 NOTE — Discharge Summary (Signed)
Patient ID: [name] [name] MRN: [id number] DOB/AGE: 05/30/1931 78 y.o. Primary Care Physician:FULP,CAMMIE J, MD Admit date: 02/17/2011 Discharge date: 02/22/2011    Discharge Diagnoses:  Gastritis Chest pain not cardiac in origin probably due to gastritis COPD without exacerbation  Medication List  As of 02/22/2011  6:20 PM   START taking these medications         omeprazole 20 MG capsule   Commonly known as: PRILOSEC   Take 1 capsule (20 mg total) by mouth 2 (two) times daily.         CONTINUE taking these medications         acetaminophen 500 MG tablet   Commonly known as: TYLENOL      aspirin 81 MG tablet      diltiazem 180 MG 24 hr capsule   Commonly known as: CARDIZEM CD   Take 1 capsule (180 mg total) by mouth daily.      gabapentin 300 MG capsule   Commonly known as: NEURONTIN   Take 1 capsule (300 mg total) by mouth 2 (two) times daily.      HYDROcodone-homatropine 5-1.5 MG/5ML syrup   Commonly known as: HYCODAN      levalbuterol 0.63 MG/3ML nebulizer solution   Commonly known as: XOPENEX   Take 3 mLs (0.63 mg total) by nebulization every 6 (six) hours as needed for wheezing or shortness of breath.      montelukast 10 MG tablet   Commonly known as: SINGULAIR      multivitamin tablet      pravastatin 40 MG tablet   Commonly known as: PRAVACHOL      PROAIR HFA 108 (90 BASE) MCG/ACT inhaler   Generic drug: albuterol      tiotropium 18 MCG inhalation capsule   Commonly known as: SPIRIVA      Vitamin D3 50000 UNITS Caps          Where to get your medications    These are the prescriptions that you need to pick up.   You may get these medications from any pharmacy.         omeprazole 20 MG capsule           Cardiac Catheterization Operative Report  [name] [name]  [id number]  1/7/20137:12 PM  FULP,CAMMIE J, MD  Procedure Performed:  1. Left Heart Catheterization 2. Selective Coronary Angiography 3. Left ventricular angiogram Operator:  Verne Carrow, MD  Indication: Chest pain.  Procedure Details:  The risks, benefits, complications, treatment options, and expected outcomes were discussed with the patient. The patient and/or family concurred with the proposed plan, giving informed consent. The patient was brought to the cath lab after IV hydration was begun and oral premedication was given. The patient was further sedated with Versed and Fentanyl. The right groin was prepped and draped in the usual manner. Using the modified Seldinger access technique, a 5 French sheath was placed in the right femoral artery. Standard diagnostic catheters were used to perform selective coronary angiography. A pigtail catheter was used to perform a left ventricular angiogram.  There were no immediate complications. The patient was taken to the recovery area in stable condition.  Hemodynamic Findings:  Central aortic pressure: 136/65  Left ventricular pressure: 130/2/5  Angiographic Findings:  Left main: No evidence of disease.  Left Anterior Descending Artery: Large vessel that courses to the apex. No evidence of disease.  Circumflex Artery: Large, dominant vessel. Large OM1. No evidence of disease.  Right Coronary Artery: Small,  non-dominant vessel. No evidence of disease.  Left Ventricular Angiogram: LVEF 55-60%. Normal wall motion.  Impression:  1. No angiographic evidence of CAD.  2. Normal LV systolic function.  3. Non-cardiac chest pain.  Recommendations: No further cardiac workup.  Complications: None. The patient tolerated the procedure well.      [name]  [address]  [place], [place] [address]  ENDOSCOPY PROCEDURE REPORT  PATIENT: [name] [name] MR#: [id number]  BIRTHDATE: 27-Apr-1931, 79 yrs. old GENDER: female  ENDOSCOPIST: Carman Ching  Referred by: [facility],  PROCEDURE DATE: 02/22/2011  PROCEDURE: EGD with biopsy, [id number]  ASA CLASS: Class III  INDICATIONS: abdominal pain patient  presented with chest and  epigastric pain. Cardiac cath was negative.  MEDICATIONS: Fentanyl 25 mcg IV, Versed 2 mg IV  TOPICAL ANESTHETIC: Cetacaine Spray  DESCRIPTION OF PROCEDURE: After the risks and benefits of the  procedure were explained, informed consent was obtained. The  Pentax Gastroscope [id number] endoscope was introduced through the  mouth and advanced to the second portion of the duodenum. The  instrument was slowly withdrawn as the mucosa was fully examined.  <<PROCEDUREIMAGES>>  Moderate gastritis was found prepyloric area With standard  forceps, a biopsy was obtained and sent to pathology.  Retroflexed views revealed Normal retroflexion. The scope was then  withdrawn from the patient and the procedure completed.  COMPLICATIONS: A complication of none occurred on 02/22/2011 at.  ENDOSCOPIC IMPRESSION:  1) Moderate gastritis in the prepyloric area  RECOMMENDATIONS:  1) continue PPI  we'll go ahead and discharge her and have her followup in the  office with me in 3-4 weeks    Discharged Condition:good    Consults: Cardiology [facility]  Significant Diagnostic Studies: Ct Abdomen Pelvis Wo Contrast  02/10/2011  *RADIOLOGY REPORT*  Clinical Data: Epigastric pain.  Vomiting.  Contrast allergy.  CT ABDOMEN AND PELVIS WITHOUT CONTRAST  Technique:  Multidetector CT imaging of the abdomen and pelvis was performed following the standard protocol without intravenous contrast.  Comparison: Ultrasound dated 02/10/2011  Findings: Mild scarring noted in the lingula, right middle lobe, anteriorly in the right lower lobe.  There appears to be some mild airway plugging in the left lower lobe.  Trace pericardial effusion noted.  A hypodense lesion posteriorly in the right hepatic lobe measures 1.1 by 0.6 cm on image 34 of series 5, and is technically nonspecific although statistically likely to be benign.  The noncontrast CT appearance of the spleen, adrenal glands,  gallbladder, and pancreas is unremarkable.  Atherosclerotic calcification of the abdominal aorta noted.  Orally administered contrast is noted and reaches the proximal colon.  Flat increased density in the right mid upper kidney peripherally on image 34 of series 103 could represent a small hemorrhagic cyst or a small capsular calcification.  No renal calculus is observed. No hydronephrosis or hydroureter.  There are vascular phleboliths, some which are in the direct vicinity of the distal ureters, but given the lack of hydroureter I doubt these represent antral ureteral calculi.  A rim calcified 2.3 x 2.0 cm round structure posterior to the ascending colon and lateral to the right psoas muscle as an internal density of 14 HU, less than that of the gallbladder fluid but greater than that in the urinary bladder.  No pathologic retroperitoneal or porta hepatis adenopathy is identified.  No pathologic pelvic adenopathy is identified.  Right eccentric Tarlov cyst noted at the S2 level.  Old left pubic ramus fractures appear healed.  There  is asymmetric prominent degenerative arthropathy of the left hip, severe in magnitude.  Mild right hip degenerative arthropathy is present. Remote deformity of the left sacrum likely relates to old fracture.  Degenerative disc disease and spondylosis noted in the lumbar spine.  Low-level mesenteric edema in the pelvis is technically nonspecific.  There is scattered diverticula, without definite findings of active diverticulitis the appendix is not discretely visualized.  IMPRESSION:  1.  Bibasilar scarring with mild airway plugging in the left lower lobe. 2.  Scattered pelvic phleboliths, some of which in the direct vicinity of the distal ureters, but given the lack of hydroureter I would doubt that these represent ureteral calculi. 3.  Rim calcified cystic lesion posterior to the ascending colon is likely retroperitoneal, along the lateral margin of the psoas muscle.  Differential  diagnostic considerations include cystic lymphangioma, mucinous cystadenoma, none pancreatic pseudocyst, cystic mesothelioma, the layering cysts, or less likely epidermoid cyst. Mucinous neoplasm cannot be entirely excluded. 4.  Old, healed left pelvic fractures. 5.  Severe degenerative arthropathy left hip, asymmetric and possibly post-traumatic. 6.  Small focus of capsular calcification or small flat hemorrhagic cyst node along the right kidney upper pole. 7.  Low-level mesenteric edema, etiology uncertain. 8.  The appendix is not visualized.   This lowers negative predictive value for acute appendicitis.  Original Report Authenticated By: Dellia Cloud, M.D.   Dg Chest 2 View  02/17/2011  *RADIOLOGY REPORT*  Clinical Data: Low mid chest pain and mid abdominal pain.  Cough. Weakness.  COPD.  CHEST - 2 VIEW  Comparison: 02/10/2011.  Findings: The heart remains normal in size.  The lungs remain hyperexpanded with no significant change in mildly prominent interstitial markings.  Stable old, healed left mid clavicle fracture.  An electronic device in the left anterior chest wall is unchanged.  Mild thoracic spine degenerative changes.  IMPRESSION: Stable changes of COPD.  No acute abnormality.  Original Report Authenticated By: Darrol Angel, M.D.   US Abdomen Complete  02/10/2011  *RADIOLOGY REPORT*  Clinical Data:  Epigastric pain, vomiting  COMPLETE ABDOMINAL ULTRASOUND  Comparison:  None.  Findings:  Gallbladder:  No gallstones, gallbladder wall thickening, or pericholecystic fluid.  Negative sonographic Murphy's sign  Common bile duct:  Normal in size measuring 5.3 mm in diameter.  Liver:  Homogeneous hepatic echotexture.  There are two subcentimeter subcapsular anechoic lesions within the right lobe of the liver which demonstrate increased through transmission and while are likely too small to accurately characterize are favored to represent hepatic cysts.  No ascites.  IVC:  Appears normal.   Pancreas:  No focal abnormality seen.  Spleen:  Normal in size measuring 6.7 cm in length.  Right Kidney:  Normal in size, measuring 10.9 cm in length.  Normal cortical echogenicity and thickness.  There is an approximately 1.0 x 8 1.0 x 0.9 cm anechoic partially exophytic lesion arising from the inferior pole of the right kidney which is too small to accurately characterize though favored to represent a renal cyst. No right-sided urinary obstruction.  Left Kidney:  Normal cortical thickness, echogenicity and size, measuring 10.4 cm in length.  No focal renal lesions.  No echogenic renal stones.  No urinary obstruction.  Abdominal aorta:  Atherosclerotic calcifications and intimal wall thickening within a normal caliber abdominal aorta.  IMPRESSION: 1.  No explanation for patient's abdominal pain, specifically, no evidence of cholelithiasis or urinary obstruction. 2.  Sub centimeter anechoic hepatic lesions favored to represent hepatic cysts. 3.  Sub centimeter anechoic right-sided renal lesions favored to represent a renal cyst.  Original Report Authenticated By: Waynard Reeds, M.D.   Dg Chest Portable 1 View  02/10/2011  *RADIOLOGY REPORT*  Clinical Data: Cough, epigastric pain, vomiting  PORTABLE CHEST - 1 VIEW  Comparison: 01/01/2011; 12/23/2010; 12/21/2010  Findings: Grossly unchanged cardiac silhouette and mediastinal contours.  The lungs remain persistently hyperinflated with grossly unchanged mid and lower lung heterogeneous opacities.  There is persistent blunting of the right costophrenic angle.  No definite pleural effusion or pneumothorax.  Grossly unchanged bones including old left midclavicular fracture.  Support apparatus overlies the left upper chest.  IMPRESSION: Hyperinflated lungs, chronic emphysematous change and scattered atelectasis/scar without definite superimposed acute process.  Original Report Authenticated By: Waynard Reeds, M.D.    Lab Results: Results for orders placed  during the hospital encounter of 02/17/11 (from the past 48 hour(s))  GLUCOSE, CAPILLARY     Status: Abnormal   Collection Time   02/20/11  7:28 PM      Component Value Range Comment   Glucose-Capillary 120 (*) 70 - 99 (mg/dL)   CBC     Status: Abnormal   Collection Time   02/22/11  5:47 AM      Component Value Range Comment   WBC 4.2  4.0 - 10.5 (K/uL)    RBC 3.60 (*) 3.87 - 5.11 (MIL/uL)    Hemoglobin 11.5 (*) 12.0 - 15.0 (g/dL)    HCT 16.1 (*) 09.6 - 46.0 (%)    MCV 95.8  78.0 - 100.0 (fL)    MCH 31.9  26.0 - 34.0 (pg)    MCHC 33.3  30.0 - 36.0 (g/dL)    RDW 04.5  40.9 - 81.1 (%)    Platelets 246  150 - 400 (K/uL)   BASIC METABOLIC PANEL     Status: Abnormal   Collection Time   02/22/11  5:47 AM      Component Value Range Comment   Sodium 135  135 - 145 (mEq/L)    Potassium 4.4  3.5 - 5.1 (mEq/L)    Chloride 99  96 - 112 (mEq/L)    CO2 29  19 - 32 (mEq/L)    Glucose, Bld 100 (*) 70 - 99 (mg/dL)    BUN 15  6 - 23 (mg/dL)    Creatinine, Ser 9.14  0.50 - 1.10 (mg/dL)    Calcium 8.7  8.4 - 10.5 (mg/dL)    GFR calc non Af Amer 85 (*) >90 (mL/min)    GFR calc Af Amer >90  >90 (mL/min)     Hospital Course:  78 year old female who has a history of chronic epigastric pain was admitted with chest pain rule out ACS. Cardiac enzymes were negative, and she was seen by cardiology and underwent cardiac cath is negative. Patient has also been seen by [facility], Dr. Randa Evens and and and he did upper GI endoscopy with findings of mild gastritis. Patient had a recent CT abdomen pelvis done on 02/10/2011 which showed rim calcified cystic lesion posterior to the ascending colon. And also has some mesenteric edema. Discussed with patient at this time he she would like to wait and watch the findings of CT and would like to repeat CT in 3-6 months as outpatient   Discharge Exam: Blood pressure 131/81, pulse 77, temperature 98.6 F (37 C), temperature source Oral, resp. rate 16, height 5\' 2"  (1.575 m),  weight 47.4 kg (104 lb 8 oz), SpO2 97.00%. Alert  oriented x3 Chest clear to auscultation bilaterally Heart regular rate and rhythm without murmurs rubs or gallops Abdomen soft nontender and bowel sounds are present  Disposition: Home Time spent discharging the patient 35 minutes  Discharge Orders    Future Appointments: Provider: Department: Dept Phone: Center:   03/14/2011 9:00 AM Waymon Budge, MD [facility] [phone] None   04/18/2011 2:30 PM [name], MD [facility] [phone] [facility]     Future Orders Please Complete By Expires   Diet - low sodium heart healthy      Diet general      Increase activity slowly         Follow-up Information    Follow up with FULP,CAMMIE J.   Contact information:   [address] [place] [address] [phone]       Follow up with Vertell Novak, MD. Schedule an appointment as soon as possible for a visit in 2 weeks.   Contact information:   [address], [address] [employer], [place]. [place] [address] [phone]          Signed: Lonia Blood 02/22/2011, 6:20 PM

## 2011-02-22 NOTE — Op Note (Signed)
[facility] [address] [place], [place]  [address]  ENDOSCOPY PROCEDURE REPORT  PATIENT:  [name], [name]  MR#:  [id number] BIRTHDATE:  23-May-1931, 79 yrs. old  GENDER:  female  ENDOSCOPIST:  Carman Ching Referred by:  [facility],  PROCEDURE DATE:  02/22/2011 PROCEDURE:  EGD with biopsy, [id number] ASA CLASS:  Class III INDICATIONS:  abdominal pain patient presented with chest and epigastric pain. Cardiac cath was negative.  MEDICATIONS:   Fentanyl 25 mcg IV, Versed 2 mg IV TOPICAL ANESTHETIC:  Cetacaine Spray  DESCRIPTION OF PROCEDURE:   After the risks and benefits of the procedure were explained, informed consent was obtained.  The Pentax Gastroscope [id number] endoscope was introduced through the mouth and advanced to the second portion of the duodenum.  The instrument was slowly withdrawn as the mucosa was fully examined. <<PROCEDUREIMAGES>>  Moderate gastritis was found prepyloric area With standard forceps, a biopsy was obtained and sent to pathology. Retroflexed views revealed Normal retroflexion.    The scope was then withdrawn from the patient and the procedure completed.  COMPLICATIONS:  A complication of none occurred on 02/22/2011 at.  ENDOSCOPIC IMPRESSION: 1) Moderate gastritis in the prepyloric area RECOMMENDATIONS: 1) continue PPI we'll go ahead and discharge her and have her followup in the office with me in 3-4 weeks  ______________________________ Carman Ching  CC:  n. eSIGNED:   Carman Ching at 02/22/2011 11:21 AM  Ewing Schlein, [phone]

## 2011-02-22 NOTE — H&P (Signed)
  H+P from office reviewed (scanned) Pt examined Chart reviewed Risks and benefits have been explained to pt.  

## 2011-02-22 NOTE — Progress Notes (Signed)
02/22/11 1445 Nursing Note: Pt to be discharged per MD's order. Pt is stable with no complaints at this time. Pt 's IV and cardiac monitor has been discontinued per MD's order. Pt received all discharge information, and pt verbalizes understanding. Will escort pt to car safely upon discharge. Karagan Lehr Scientist, clinical (histocompatibility and immunogenetics).

## 2011-02-23 ENCOUNTER — Encounter (HOSPITAL_COMMUNITY): Payer: Self-pay | Admitting: Gastroenterology

## 2011-02-24 ENCOUNTER — Emergency Department (HOSPITAL_COMMUNITY)
Admission: EM | Admit: 2011-02-24 | Discharge: 2011-02-24 | Disposition: A | Discharge status: Home / Self Care | Payer: Medicare Other | Attending: Emergency Medicine | Admitting: Emergency Medicine

## 2011-02-24 ENCOUNTER — Emergency Department (HOSPITAL_COMMUNITY): Payer: Medicare Other

## 2011-02-24 ENCOUNTER — Encounter (HOSPITAL_COMMUNITY): Payer: Self-pay | Admitting: *Deleted

## 2011-02-24 DIAGNOSIS — J449 Chronic obstructive pulmonary disease, unspecified: Secondary | ICD-10-CM | POA: Diagnosis not present

## 2011-02-24 DIAGNOSIS — E119 Type 2 diabetes mellitus without complications: Secondary | ICD-10-CM | POA: Diagnosis not present

## 2011-02-24 DIAGNOSIS — I10 Essential (primary) hypertension: Secondary | ICD-10-CM | POA: Diagnosis not present

## 2011-02-24 DIAGNOSIS — Z4789 Encounter for other orthopedic aftercare: Secondary | ICD-10-CM | POA: Diagnosis not present

## 2011-02-24 DIAGNOSIS — R112 Nausea with vomiting, unspecified: Secondary | ICD-10-CM | POA: Diagnosis not present

## 2011-02-24 DIAGNOSIS — K5900 Constipation, unspecified: Secondary | ICD-10-CM | POA: Diagnosis not present

## 2011-02-24 DIAGNOSIS — M9320 Osteochondritis dissecans of unspecified site: Secondary | ICD-10-CM | POA: Diagnosis not present

## 2011-02-24 DIAGNOSIS — R1084 Generalized abdominal pain: Secondary | ICD-10-CM | POA: Diagnosis not present

## 2011-02-24 DIAGNOSIS — J4489 Other specified chronic obstructive pulmonary disease: Secondary | ICD-10-CM | POA: Insufficient documentation

## 2011-02-24 DIAGNOSIS — R1013 Epigastric pain: Secondary | ICD-10-CM | POA: Diagnosis not present

## 2011-02-24 DIAGNOSIS — R110 Nausea: Secondary | ICD-10-CM | POA: Diagnosis not present

## 2011-02-24 DIAGNOSIS — R109 Unspecified abdominal pain: Secondary | ICD-10-CM | POA: Insufficient documentation

## 2011-02-24 LAB — COMPREHENSIVE METABOLIC PANEL
ALT: 23 U/L (ref 0–35)
AST: 14 U/L (ref 0–37)
Albumin: 3.2 g/dL — ABNORMAL LOW (ref 3.5–5.2)
Alkaline Phosphatase: 67 U/L (ref 39–117)
GFR calc Af Amer: >90 mL/min (ref >90)
Glucose, Bld: 121 mg/dL — ABNORMAL HIGH (ref 70–99)
Potassium: 3.9 mEq/L (ref 3.5–5.1)
Sodium: 132 mEq/L — ABNORMAL LOW (ref 135–145)
Total Protein: 6.3 g/dL (ref 6.0–8.3)

## 2011-02-24 LAB — CBC
MCH: 32.1 pg (ref 26.0–34.0)
Platelets: 244 10*3/uL (ref 150–400)
RBC: 3.55 MIL/uL — ABNORMAL LOW (ref 3.87–5.11)
RDW: 13.9 % (ref 11.5–15.5)
WBC: 4.9 10*3/uL (ref 4.0–10.5)

## 2011-02-24 LAB — URINALYSIS, ROUTINE W REFLEX MICROSCOPIC
Bilirubin Urine: NEGATIVE
Glucose, UA: NEGATIVE mg/dL
Hgb urine dipstick: NEGATIVE
Specific Gravity, Urine: 1.007 (ref 1.005–1.030)
pH: 7.5 (ref 5.0–8.0)

## 2011-02-24 LAB — DIFFERENTIAL
Basophils Absolute: 0 10*3/uL (ref 0.0–0.1)
Eosinophils Absolute: 0 10*3/uL (ref 0.0–0.7)
Lymphs Abs: 1.5 10*3/uL (ref 0.7–4.0)
Neutrophils Relative %: 60 % (ref 43–77)

## 2011-02-24 MED ORDER — ONDANSETRON HCL 4 MG/2ML IJ SOLN
INTRAMUSCULAR | Status: AC
  Administered 2011-02-24: 4 mg
  Filled 2011-02-24: qty 2

## 2011-02-24 MED ORDER — PANTOPRAZOLE SODIUM 40 MG IV SOLR
40.0000 mg | Freq: Once | INTRAVENOUS | Status: AC
  Administered 2011-02-24: 40 mg via INTRAVENOUS
  Filled 2011-02-24: qty 40

## 2011-02-24 MED ORDER — ONDANSETRON HCL 4 MG PO TABS: 4.0000 mg | ORAL_TABLET | Freq: Four times a day (QID) | ORAL | Status: DC

## 2011-02-24 MED ORDER — MORPHINE SULFATE 4 MG/ML IJ SOLN
4.0000 mg | Freq: Once | INTRAMUSCULAR | Status: AC
  Administered 2011-02-24: 4 mg via INTRAVENOUS
  Filled 2011-02-24: qty 1

## 2011-02-24 MED ORDER — SODIUM CHLORIDE 0.9 % IV SOLN
Freq: Once | INTRAVENOUS | Status: AC
  Administered 2011-02-24: 19:00:00 via INTRAVENOUS

## 2011-02-24 NOTE — ED Notes (Signed)
Caralee Ates (son) [phone]  [phone]

## 2011-02-24 NOTE — ED Notes (Signed)
Pt presents to department for evaluation of diffuse abdominal pain and N/V. Was seen recently by PCP and referred to ED for further testing. Pt states intermittent abdominal pain for several months. Pt also states decreased appetite. Abdomen tender to palpation all quadrants. Bowel sounds present. Pt states recent constipation, last BM today. Pt is conscious alert and oriented x4. 8/10 pain at the present, but does not want pain medication at present. Son at bedside.

## 2011-02-24 NOTE — ED Provider Notes (Signed)
Medical screening examination/treatment/procedure(s) were conducted as a shared visit with non-physician practitioner(s) and myself.  I personally evaluated the patient during the encounter.  Patient with persistent epigastric abdominal pain for 2 weeks, status post 2 admissions to the hospital, had cardiac catheterization, as well as GI consultation. She has pain medicine use at home, but is apparently not taking it. She does not have any antibiotic currently. She is tender epigastrium. Vital signs are essentially normal. ED evaluation indicates no significant change. Patient stable for discharge with outpatient treatment, and follow up with GI physician or her primary care Dr.  Flint Melter, MD 02/24/11 2221
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
## 2011-02-24 NOTE — ED Notes (Signed)
abd pain with nv for one month for 1-2 months.  She has seen her doctor and papers were sent with her for the same

## 2011-02-24 NOTE — ED Notes (Signed)
Son called to come pick up patient  He will be here at 1115 after work.

## 2011-02-24 NOTE — ED Notes (Signed)
Pt medicated for abdominal pain and nausea. Encouraged to try drinking oral contrast for CT. She remains alert, vital signs stable at the time.

## 2011-02-25 NOTE — ED Provider Notes (Signed)
Medical screening examination/treatment/procedure(s) were conducted as a shared visit with non-physician practitioner(s) and myself.  I personally evaluated the patient during the encounter  Flint Melter, MD 02/25/11 1035

## 2011-03-02 ENCOUNTER — Encounter (HOSPITAL_COMMUNITY): Payer: Self-pay

## 2011-03-02 ENCOUNTER — Emergency Department (HOSPITAL_COMMUNITY): Payer: Medicare Other

## 2011-03-02 ENCOUNTER — Other Ambulatory Visit: Payer: Self-pay

## 2011-03-02 ENCOUNTER — Inpatient Hospital Stay (HOSPITAL_COMMUNITY)
Admission: EM | Admit: 2011-03-02 | Discharge: 2011-03-04 | DRG: 191 | Disposition: A | Discharge status: Home / Self Care | Payer: Medicare Other | Attending: Internal Medicine | Admitting: Internal Medicine

## 2011-03-02 DIAGNOSIS — I959 Hypotension, unspecified: Secondary | ICD-10-CM | POA: Diagnosis present

## 2011-03-02 DIAGNOSIS — E119 Type 2 diabetes mellitus without complications: Secondary | ICD-10-CM | POA: Diagnosis present

## 2011-03-02 DIAGNOSIS — R911 Solitary pulmonary nodule: Secondary | ICD-10-CM | POA: Diagnosis not present

## 2011-03-02 DIAGNOSIS — R1013 Epigastric pain: Secondary | ICD-10-CM | POA: Diagnosis not present

## 2011-03-02 DIAGNOSIS — R935 Abnormal findings on diagnostic imaging of other abdominal regions, including retroperitoneum: Secondary | ICD-10-CM | POA: Diagnosis not present

## 2011-03-02 DIAGNOSIS — E871 Hypo-osmolality and hyponatremia: Secondary | ICD-10-CM | POA: Diagnosis not present

## 2011-03-02 DIAGNOSIS — J441 Chronic obstructive pulmonary disease with (acute) exacerbation: Principal | ICD-10-CM | POA: Diagnosis present

## 2011-03-02 DIAGNOSIS — K219 Gastro-esophageal reflux disease without esophagitis: Secondary | ICD-10-CM | POA: Diagnosis not present

## 2011-03-02 DIAGNOSIS — R0602 Shortness of breath: Secondary | ICD-10-CM | POA: Diagnosis not present

## 2011-03-02 DIAGNOSIS — I10 Essential (primary) hypertension: Secondary | ICD-10-CM | POA: Diagnosis present

## 2011-03-02 DIAGNOSIS — E785 Hyperlipidemia, unspecified: Secondary | ICD-10-CM | POA: Diagnosis present

## 2011-03-02 DIAGNOSIS — R079 Chest pain, unspecified: Secondary | ICD-10-CM | POA: Diagnosis not present

## 2011-03-02 DIAGNOSIS — R918 Other nonspecific abnormal finding of lung field: Secondary | ICD-10-CM | POA: Diagnosis not present

## 2011-03-02 HISTORY — DX: Personal history of other diseases of the digestive system: Z87.19

## 2011-03-02 HISTORY — DX: Unspecified osteoarthritis, unspecified site: M19.90

## 2011-03-02 HISTORY — DX: Chronic sinusitis, unspecified: J32.9

## 2011-03-02 HISTORY — DX: Malignant melanoma of skin, unspecified: C43.9

## 2011-03-02 HISTORY — DX: Angina pectoris, unspecified: I20.9

## 2011-03-02 HISTORY — DX: Reserved for inherently not codable concepts without codable children: IMO0001

## 2011-03-02 HISTORY — DX: Anemia, unspecified: D64.9

## 2011-03-02 HISTORY — DX: Encounter for other specified aftercare: Z51.89

## 2011-03-02 HISTORY — DX: Unspecified atrial fibrillation: I48.91

## 2011-03-02 LAB — DIFFERENTIAL
Eosinophils Absolute: 0 10*3/uL (ref 0.0–0.7)
Eosinophils Relative: 0 % (ref 0–5)
Lymphocytes Relative: 9 % — ABNORMAL LOW (ref 12–46)
Lymphs Abs: 0.8 10*3/uL (ref 0.7–4.0)
Monocytes Relative: 7 % (ref 3–12)

## 2011-03-02 LAB — CBC
Hemoglobin: 13.1 g/dL (ref 12.0–15.0)
MCH: 31.6 pg (ref 26.0–34.0)
MCV: 95.2 fL (ref 78.0–100.0)
RBC: 4.14 MIL/uL (ref 3.87–5.11)
WBC: 8.2 10*3/uL (ref 4.0–10.5)

## 2011-03-02 LAB — URINALYSIS, ROUTINE W REFLEX MICROSCOPIC
Bilirubin Urine: NEGATIVE
Hgb urine dipstick: NEGATIVE
Nitrite: NEGATIVE
Protein, ur: 30 mg/dL — AB
Specific Gravity, Urine: 1.015 (ref 1.005–1.030)
Urobilinogen, UA: 0.2 mg/dL (ref 0.0–1.0)

## 2011-03-02 LAB — COMPREHENSIVE METABOLIC PANEL
ALT: 18 U/L (ref 0–35)
Alkaline Phosphatase: 82 U/L (ref 39–117)
BUN: 16 mg/dL (ref 6–23)
CO2: 28 mEq/L (ref 19–32)
Calcium: 9.1 mg/dL (ref 8.4–10.5)
GFR calc Af Amer: >90 mL/min (ref >90)
GFR calc non Af Amer: 89 mL/min — ABNORMAL LOW (ref >90)
Glucose, Bld: 173 mg/dL — ABNORMAL HIGH (ref 70–99)
Potassium: 4.4 mEq/L (ref 3.5–5.1)
Sodium: 132 mEq/L — ABNORMAL LOW (ref 135–145)
Total Protein: 6.9 g/dL (ref 6.0–8.3)

## 2011-03-02 LAB — LIPASE, BLOOD: Lipase: 75 U/L — ABNORMAL HIGH (ref 11–59)

## 2011-03-02 LAB — URINE MICROSCOPIC-ADD ON

## 2011-03-02 LAB — TROPONIN I: Troponin I: <0.3 ng/mL (ref <0.30)

## 2011-03-02 MED ORDER — LEVALBUTEROL HCL 0.63 MG/3ML IN NEBU
0.6300 mg | INHALATION_SOLUTION | Freq: Once | RESPIRATORY_TRACT | Status: AC
  Administered 2011-03-02: 0.63 mg via RESPIRATORY_TRACT
  Filled 2011-03-02: qty 3

## 2011-03-02 MED ORDER — MORPHINE SULFATE 4 MG/ML IJ SOLN
4.0000 mg | Freq: Once | INTRAMUSCULAR | Status: AC
  Administered 2011-03-02: 4 mg via INTRAVENOUS
  Filled 2011-03-02: qty 1

## 2011-03-02 MED ORDER — FAMOTIDINE IN NACL 20-0.9 MG/50ML-% IV SOLN
20.0000 mg | Freq: Once | INTRAVENOUS | Status: AC
  Administered 2011-03-02: 20 mg via INTRAVENOUS
  Filled 2011-03-02: qty 50

## 2011-03-02 MED ORDER — LEVALBUTEROL HCL 1.25 MG/0.5ML IN NEBU
1.2500 mg | INHALATION_SOLUTION | Freq: Once | RESPIRATORY_TRACT | Status: AC
  Administered 2011-03-03: 1.25 mg via RESPIRATORY_TRACT
  Filled 2011-03-02: qty 0.5

## 2011-03-02 MED ORDER — IPRATROPIUM BROMIDE 0.02 % IN SOLN
0.5000 mg | Freq: Once | RESPIRATORY_TRACT | Status: AC
  Administered 2011-03-02: 0.5 mg via RESPIRATORY_TRACT
  Filled 2011-03-02: qty 2.5

## 2011-03-02 MED ORDER — ONDANSETRON HCL 4 MG/2ML IJ SOLN
4.0000 mg | Freq: Once | INTRAMUSCULAR | Status: AC
  Administered 2011-03-02: 4 mg via INTRAVENOUS
  Filled 2011-03-02: qty 2

## 2011-03-02 MED ORDER — ALBUTEROL SULFATE (5 MG/ML) 0.5% IN NEBU: 5.0000 mg | INHALATION_SOLUTION | Freq: Once | RESPIRATORY_TRACT | Status: DC

## 2011-03-02 MED ORDER — PREDNISONE 20 MG PO TABS
40.0000 mg | ORAL_TABLET | Freq: Once | ORAL | Status: AC
  Administered 2011-03-02: 40 mg via ORAL
  Filled 2011-03-02: qty 2

## 2011-03-02 NOTE — ED Notes (Signed)
Pt noted with less work of breathing, still with some wheezing noted. Resting with eyes closed. O2 sats 98% on 2L [place]. SR up. Will continue to monitor.

## 2011-03-02 NOTE — ED Notes (Signed)
Respiratory repaged for resp tx.

## 2011-03-02 NOTE — ED Notes (Signed)
RT called to give breathing tx; report given to [facility]

## 2011-03-02 NOTE — ED Notes (Signed)
Pt presents with productive cough, shortness of breath and chest pain.  Pt seen at PCP today, was given prednisone and unknown medication.
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
## 2011-03-02 NOTE — ED Notes (Signed)
Received pt from tx rm. Pt noted with labored respirations at rest, rate low thirties. O2 sats 93% on 2L [place]. Noted afib on monitor at 110. Pt appears moderately anxious. Son reports frequent episodes of these same symptoms i.e. Sob, anxiety, nausea, chest pain at least once a week. Seen at PMD and started on steroids and antibiotics. Pt has no active vomiting. Awaiting md eval.

## 2011-03-03 ENCOUNTER — Encounter (HOSPITAL_COMMUNITY): Payer: Self-pay | Admitting: General Practice

## 2011-03-03 ENCOUNTER — Inpatient Hospital Stay (HOSPITAL_COMMUNITY): Payer: Medicare Other

## 2011-03-03 DIAGNOSIS — E119 Type 2 diabetes mellitus without complications: Secondary | ICD-10-CM | POA: Diagnosis present

## 2011-03-03 DIAGNOSIS — I959 Hypotension, unspecified: Secondary | ICD-10-CM | POA: Diagnosis present

## 2011-03-03 DIAGNOSIS — R911 Solitary pulmonary nodule: Secondary | ICD-10-CM | POA: Diagnosis present

## 2011-03-03 DIAGNOSIS — J984 Other disorders of lung: Secondary | ICD-10-CM | POA: Diagnosis not present

## 2011-03-03 DIAGNOSIS — E785 Hyperlipidemia, unspecified: Secondary | ICD-10-CM | POA: Diagnosis present

## 2011-03-03 DIAGNOSIS — J441 Chronic obstructive pulmonary disease with (acute) exacerbation: Secondary | ICD-10-CM | POA: Diagnosis not present

## 2011-03-03 DIAGNOSIS — E871 Hypo-osmolality and hyponatremia: Secondary | ICD-10-CM | POA: Diagnosis present

## 2011-03-03 DIAGNOSIS — I10 Essential (primary) hypertension: Secondary | ICD-10-CM | POA: Diagnosis present

## 2011-03-03 DIAGNOSIS — S2239XA Fracture of one rib, unspecified side, initial encounter for closed fracture: Secondary | ICD-10-CM | POA: Diagnosis not present

## 2011-03-03 DIAGNOSIS — K219 Gastro-esophageal reflux disease without esophagitis: Secondary | ICD-10-CM | POA: Diagnosis present

## 2011-03-03 DIAGNOSIS — J438 Other emphysema: Secondary | ICD-10-CM | POA: Diagnosis not present

## 2011-03-03 HISTORY — PX: SKIN CANCER EXCISION: SHX779

## 2011-03-03 LAB — GLUCOSE, CAPILLARY: Glucose-Capillary: 185 mg/dL — ABNORMAL HIGH (ref 70–99)

## 2011-03-03 LAB — CBC
HCT: 34.7 % — ABNORMAL LOW (ref 36.0–46.0)
MCH: 32 pg (ref 26.0–34.0)
MCHC: 34 g/dL (ref 30.0–36.0)
MCV: 94 fL (ref 78.0–100.0)
Platelets: 229 10*3/uL (ref 150–400)
RDW: 13.8 % (ref 11.5–15.5)
WBC: 4.7 10*3/uL (ref 4.0–10.5)

## 2011-03-03 LAB — CREATININE, SERUM: GFR calc Af Amer: >90 mL/min (ref >90)

## 2011-03-03 MED ORDER — METHYLPREDNISOLONE SODIUM SUCC 125 MG IJ SOLR
80.0000 mg | Freq: Four times a day (QID) | INTRAMUSCULAR | Status: DC
  Administered 2011-03-03 (×2): 80 mg via INTRAVENOUS
  Filled 2011-03-03 (×5): qty 1.28

## 2011-03-03 MED ORDER — ONDANSETRON HCL 4 MG PO TABS: 4.0000 mg | ORAL_TABLET | Freq: Four times a day (QID) | ORAL | Status: DC | PRN

## 2011-03-03 MED ORDER — SODIUM CHLORIDE 0.9 % IV SOLN
INTRAVENOUS | Status: DC
  Administered 2011-03-03 (×2): via INTRAVENOUS

## 2011-03-03 MED ORDER — LEVOFLOXACIN IN D5W 500 MG/100ML IV SOLN
500.0000 mg | Freq: Once | INTRAVENOUS | Status: AC
  Administered 2011-03-03: 500 mg via INTRAVENOUS
  Filled 2011-03-03: qty 100

## 2011-03-03 MED ORDER — GABAPENTIN 300 MG PO CAPS
300.0000 mg | ORAL_CAPSULE | Freq: Two times a day (BID) | ORAL | Status: DC
  Administered 2011-03-03 – 2011-03-04 (×4): 300 mg via ORAL
  Filled 2011-03-03 (×5): qty 1

## 2011-03-03 MED ORDER — LEVALBUTEROL HCL 0.63 MG/3ML IN NEBU
0.6300 mg | INHALATION_SOLUTION | Freq: Four times a day (QID) | RESPIRATORY_TRACT | Status: DC | PRN
  Administered 2011-03-03: 0.63 mg via RESPIRATORY_TRACT
  Filled 2011-03-03: qty 3

## 2011-03-03 MED ORDER — ADULT MULTIVITAMIN W/MINERALS CH
1.0000 | ORAL_TABLET | Freq: Every day | ORAL | Status: DC
  Administered 2011-03-03 – 2011-03-04 (×2): 1 via ORAL
  Filled 2011-03-03 (×2): qty 1

## 2011-03-03 MED ORDER — GLUCERNA SHAKE PO LIQD
237.0000 mL | Freq: Two times a day (BID) | ORAL | Status: DC
  Administered 2011-03-03 – 2011-03-04 (×2): 237 mL via ORAL

## 2011-03-03 MED ORDER — GUAIFENESIN ER 600 MG PO TB12
600.0000 mg | ORAL_TABLET | Freq: Two times a day (BID) | ORAL | Status: DC
  Administered 2011-03-03 – 2011-03-04 (×3): 600 mg via ORAL
  Filled 2011-03-03 (×4): qty 1

## 2011-03-03 MED ORDER — ONDANSETRON HCL 4 MG PO TABS
4.0000 mg | ORAL_TABLET | Freq: Four times a day (QID) | ORAL | Status: DC
  Administered 2011-03-03 – 2011-03-04 (×4): 4 mg via ORAL
  Filled 2011-03-03 (×10): qty 1

## 2011-03-03 MED ORDER — VITAMIN D (ERGOCALCIFEROL) 1.25 MG (50000 UNIT) PO CAPS: 50000.0000 [IU] | ORAL_CAPSULE | ORAL | Status: DC

## 2011-03-03 MED ORDER — PANTOPRAZOLE SODIUM 40 MG PO TBEC
40.0000 mg | DELAYED_RELEASE_TABLET | Freq: Every day | ORAL | Status: DC
  Administered 2011-03-03 – 2011-03-04 (×2): 40 mg via ORAL
  Filled 2011-03-03 (×2): qty 1

## 2011-03-03 MED ORDER — BIOTENE DRY MOUTH MT LIQD
15.0000 mL | Freq: Two times a day (BID) | OROMUCOSAL | Status: DC
  Administered 2011-03-03 – 2011-03-04 (×2): 15 mL via OROMUCOSAL

## 2011-03-03 MED ORDER — MONTELUKAST SODIUM 10 MG PO TABS
10.0000 mg | ORAL_TABLET | Freq: Every day | ORAL | Status: DC
  Administered 2011-03-03 – 2011-03-04 (×2): 10 mg via ORAL
  Filled 2011-03-03 (×2): qty 1

## 2011-03-03 MED ORDER — ROSUVASTATIN CALCIUM 5 MG PO TABS
5.0000 mg | ORAL_TABLET | Freq: Every day | ORAL | Status: DC
  Administered 2011-03-03: 5 mg via ORAL
  Filled 2011-03-03 (×2): qty 1

## 2011-03-03 MED ORDER — ENOXAPARIN SODIUM 40 MG/0.4ML ~~LOC~~ SOLN
40.0000 mg | SUBCUTANEOUS | Status: DC
  Administered 2011-03-03: 40 mg via SUBCUTANEOUS
  Filled 2011-03-03 (×2): qty 0.4

## 2011-03-03 MED ORDER — SIMVASTATIN 20 MG PO TABS
20.0000 mg | ORAL_TABLET | Freq: Every day | ORAL | Status: DC
  Filled 2011-03-03: qty 1

## 2011-03-03 MED ORDER — INSULIN ASPART 100 UNIT/ML ~~LOC~~ SOLN
0.0000 [IU] | Freq: Every day | SUBCUTANEOUS | Status: DC
  Filled 2011-03-03: qty 3

## 2011-03-03 MED ORDER — LEVOFLOXACIN IN D5W 250 MG/50ML IV SOLN
250.0000 mg | INTRAVENOUS | Status: DC
  Administered 2011-03-04: 250 mg via INTRAVENOUS
  Filled 2011-03-03: qty 50

## 2011-03-03 MED ORDER — HYDROCODONE-HOMATROPINE 5-1.5 MG/5ML PO SYRP
5.0000 mL | ORAL_SOLUTION | ORAL | Status: DC | PRN
  Administered 2011-03-03: 5 mL via ORAL

## 2011-03-03 MED ORDER — LEVOFLOXACIN IN D5W 500 MG/100ML IV SOLN: 500.0000 mg | INTRAVENOUS | Status: DC

## 2011-03-03 MED ORDER — INSULIN ASPART 100 UNIT/ML ~~LOC~~ SOLN
0.0000 [IU] | Freq: Three times a day (TID) | SUBCUTANEOUS | Status: DC
  Administered 2011-03-03 – 2011-03-04 (×2): 2 [IU] via SUBCUTANEOUS
  Filled 2011-03-03: qty 3

## 2011-03-03 MED ORDER — METHYLPREDNISOLONE SODIUM SUCC 125 MG IJ SOLR
80.0000 mg | Freq: Three times a day (TID) | INTRAMUSCULAR | Status: DC
  Administered 2011-03-03 – 2011-03-04 (×3): 80 mg via INTRAVENOUS
  Filled 2011-03-03 (×3): qty 1.28

## 2011-03-03 MED ORDER — ACETAMINOPHEN 500 MG PO TABS
500.0000 mg | ORAL_TABLET | Freq: Four times a day (QID) | ORAL | Status: DC | PRN
  Administered 2011-03-03: 500 mg via ORAL
  Filled 2011-03-03: qty 1

## 2011-03-03 MED ORDER — MORPHINE SULFATE 2 MG/ML IJ SOLN: 1.0000 mg | INTRAMUSCULAR | Status: DC | PRN

## 2011-03-03 MED ORDER — ASPIRIN 81 MG PO CHEW
81.0000 mg | CHEWABLE_TABLET | Freq: Every day | ORAL | Status: DC
  Administered 2011-03-03 – 2011-03-04 (×2): 81 mg via ORAL
  Filled 2011-03-03 (×2): qty 1

## 2011-03-03 MED ORDER — DILTIAZEM HCL ER COATED BEADS 180 MG PO CP24
180.0000 mg | ORAL_CAPSULE | Freq: Every day | ORAL | Status: DC
  Administered 2011-03-03 – 2011-03-04 (×2): 180 mg via ORAL
  Filled 2011-03-03 (×2): qty 1

## 2011-03-03 NOTE — Progress Notes (Signed)
PT.c/o itching while Levaquin is infusing.DR.Garba made aware & ordered to hold it .

## 2011-03-03 NOTE — Progress Notes (Signed)
Patient [name], 78 year old white female is struggling with several health issues including inability to eat and maintain weight.  However, she is feelings encouraged by her ongoing relationship with God and her two sons.  Patient expressed appreciation for Chaplain's provisions of pastoral presence, prayer, and conversation.  I will follow-up as needed.

## 2011-03-03 NOTE — Progress Notes (Signed)
PT DISCHARGE NOTE:  PT evaluation completed.  Pt is performing at/near baseline level of function and presents with no further acute PT needs.   Acute PTsigning off.  Discussed plan with pt/family and they agree.    Amzie Sillas L. Anvay Tennis DPT [phone] 03/03/2011

## 2011-03-03 NOTE — Progress Notes (Signed)
Subjective: Patient feels better, denies chest pain, nausea/vomiting, abdominal pain or any other acute complaints. She reports that her breathing has improved since treatment was started.  Objective: Vital signs in last 24 hours: Temp:  [97.5 F (36.4 C)-98.4 F (36.9 C)] 98.1 F (36.7 C) (01/18 1446) Pulse Rate:  [76-116] 76  (01/18 1446) Resp:  [16-30] 20  (01/18 1446) BP: (98-199)/(56-97) 115/68 mmHg (01/18 1446) SpO2:  [92 %-100 %] 92 % (01/18 1446) Weight:  [41 kg (90 lb 6.2 oz)] 41 kg (90 lb 6.2 oz) (01/18 0205) Weight change:  Last BM Date: 03/02/11  Intake/Output from previous day: 01/17 0701 - 01/18 0700 In: 520 [P.O.:60; I.V.:410; IV Piggyback:50] Out: -  Total I/O In: 480 [P.O.:480] Out: -    Physical Exam: General: Alert, awake, oriented x3, in no acute distress. HEENT: No bruits, no goiter. Heart: Regular rate and rhythm, without murmurs, rubs, gallops. Lungs: Improve her movement, positive expiratory wheezing; scattered rhonchi, no crackles. Abdomen: Soft, nontender, nondistended, positive bowel sounds. Extremities: No clubbing cyanosis or edema with positive pedal pulses. Neuro: Grossly intact, nonfocal.   Lab Results: Basic Metabolic Panel:  Basename 03/03/11 0238 03/02/11 1749  NA -- 132*  K -- 4.4  CL -- 95*  CO2 -- 28  GLUCOSE -- 173*  BUN -- 16  CREATININE 0.59 0.53  CALCIUM -- 9.1  MG -- --  PHOS -- --   Liver Function Tests:  Basename 03/02/11 1749  AST 22  ALT 18  ALKPHOS 82  BILITOT 0.2*  PROT 6.9  ALBUMIN 3.5    Basename 03/02/11 1749  LIPASE 75*  AMYLASE --   CBC:  Basename 03/03/11 0238 03/02/11 1749  WBC 4.7 8.2  NEUTROABS -- 6.8  HGB 11.8* 13.1  HCT 34.7* 39.4  MCV 94.0 95.2  PLT 229 250   Cardiac Enzymes:  Basename 03/02/11 2102  CKTOTAL --  CKMB --  CKMBINDEX --  TROPONINI <0.30   CBG:  Basename 03/03/11 0741  GLUCAP 183*   Urine Drug Screen: Drugs of Abuse     Component Value Date/Time   LABOPIA NONE DETECTED 01/02/2011 0232   COCAINSCRNUR NONE DETECTED 01/02/2011 0232   LABBENZ NONE DETECTED 01/02/2011 0232   AMPHETMU NONE DETECTED 01/02/2011 0232   THCU NONE DETECTED 01/02/2011 0232   LABBARB NONE DETECTED 01/02/2011 0232     Studies/Results: Dg Chest 2 View  03/02/2011  *RADIOLOGY REPORT*  Clinical Data: Chest pain and shortness of breath with cough and back pain.  CHEST - 2 VIEW  Comparison: 02/17/2011. CT chest 03/08/2009.  Findings: Trachea is midline.  Heart size normal.  Right hilar prominence appears unchanged over several prior exams.  Lungs are hyperinflated.  A nodular density in the lower right hemithorax appears more prominent than on 03/08/2009. No airspace consolidation or pleural fluid.  IMPRESSION: Nodular opacity at the base of the right hemithorax appears more prominent than on  03/08/2009.  Non emergent CT chest without contrast could be performed in further evaluation, as clinically indicated. These results will be called to the ordering clinician or representative by the Radiologist Assistant, and communication documented in the PACS Dashboard.  Original Report Authenticated By: Reyes Ivan, M.D.   Ct Chest Wo Contrast  03/03/2011  *RADIOLOGY REPORT*  Clinical Data: Possible right-sided lung nodule.  Hypertension. Diabetes.  CT CHEST WITHOUT CONTRAST  Technique:  Multidetector CT imaging of the chest was performed following the standard protocol without IV contrast.  Comparison: Plain film 03/02/2011.  Most recent  chest CT of 03/08/2009.  Findings: Lung windows demonstrate right-sided tracheal diverticulum on image 9 is unchanged. Mild centrilobular emphysema. Subtle right upper lobe peribronchovascular reticular opacities. Example image 18 of series 3.  These are new since the prior CT. Similar thickening along the right minor fissure on transverse image 29 and sagittal image 30. 7 mm right middle lobe nodule on the prior exam is less apparent. Today it  measures approximately 5 mm, including on image 40.  There is newly increased right middle lobe and anterior right lower lobe bronchial wall thickening. The plain film abnormality is without pulmonary parenchymal correlate.  There are areas of anterolateral right pleural thickening which may have caused this appearance on plain film. Focal bronchiectasis or partially fluid-filled blebs are similar in the medial left upper lobe on image 33. Mild loss/scarring within the left lower lobe dependently.  Soft tissue windows demonstrate normal heart size with a mild pectus excavatum deformity. Multivessel coronary artery atherosclerosis.  No pericardial or pleural effusion.  No mediastinal or definite hilar adenopathy, given limitations of unenhanced CT.  Air fluid level of thoracic esophagus on image 24.  Limited abdominal imaging demonstrates artifact versus a sub centimeter low density left hepatic lobe lesion on image 60, indeterminate.  Not definitely present on the prior exam.  Minimal hyperattenuation along the interpolar right kidney on image 61 is indeterminate but likely related to a tiny hemorrhagic cyst.  Mild osteopenia.  A posterolateral right 10th rib fracture is nonacute but new since the prior of 03/08/2009.  This accounted for the plain film abnormality.  IMPRESSION:  1.  The plain film abnormality was secondary to a nonacute/healing posterior right ninth rib fracture. 2.  No suspicious pulmonary nodule. 3.  Scattered areas of new or progressive peribronchovascular micro nodularity and bronchial wall thickening.  Suspect infection, including atypical etiologies such as Mycobacterium avium intracellular.  This is of indeterminate acuity. 4.  Mild underlying centrilobular emphysema. 5.  Possible sub centimeter low density liver lesion.  Nonspecific. If there is a history of primary malignancy or a concern of liver disease, dedicated outpatient contrast enhanced MRI should be considered.  Original Report  Authenticated By: Consuello Bossier, M.D.    Medications: Scheduled Meds:    . antiseptic oral rinse  15 mL Mouth Rinse BID  . aspirin  81 mg Oral Daily  . diltiazem  180 mg Oral Daily  . enoxaparin  40 mg Subcutaneous Q24H  . famotidine (PEPCID) IV  20 mg Intravenous Once  . feeding supplement  237 mL Oral BID WC  . gabapentin  300 mg Oral BID  . guaiFENesin  600 mg Oral BID  . insulin aspart  0-5 Units Subcutaneous QHS  . insulin aspart  0-9 Units Subcutaneous TID WC  . ipratropium  0.5 mg Nebulization Once  . levalbuterol  0.63 mg Nebulization Once  . levalbuterol  1.25 mg Nebulization Once  . levofloxacin (LEVAQUIN) IV  250 mg Intravenous Q24H  . levofloxacin (LEVAQUIN) IV  500 mg Intravenous Once  . methylPREDNISolone (SOLU-MEDROL) injection  80 mg Intravenous Q8H  . montelukast  10 mg Oral Daily  .  morphine injection  4 mg Intravenous Once  . mulitivitamin with minerals  1 tablet Oral Daily  . ondansetron (ZOFRAN) IV  4 mg Intravenous Once  . ondansetron  4 mg Oral Q6H  . pantoprazole  40 mg Oral Q1200  . predniSONE  40 mg Oral Once  . rosuvastatin  5 mg Oral q1800  .  Vitamin D (Ergocalciferol)  50,000 Units Oral Q Sun  . DISCONTD: albuterol  5 mg Nebulization Once  . DISCONTD: levofloxacin (LEVAQUIN) IV  500 mg Intravenous Q24H  . DISCONTD: methylPREDNISolone (SOLU-MEDROL) injection  80 mg Intravenous Q6H  . DISCONTD: simvastatin  20 mg Oral q1800   Continuous Infusions:   . sodium chloride 20 mL/hr at 03/03/11 1333   PRN Meds:.acetaminophen, HYDROcodone-homatropine, levalbuterol, morphine, ondansetron  Assessment/Plan: 1-COPD exacerbation: Most likely secondary to medication noncompliance and bronchitis/bronchiectasis. Will continue nebulizer treatment, continue antibiotics and will continue steroids.  2-Diabetes mellitus: Will check hemoglobin A1c, start the patient on sliding scale insulin. Carb modified diet has been ordered.  3-Hyponatremia: improve with  fluid resus at this point we're going to put the patient on KVO. She is tolerating diet and drinking properly. Citation.  4-Lung nodule: Per CT scan report findings are not suggestive for malignancy.  5-hyperlipidemia: Continue statins.  6-GERD: Continue PPI.  7-hypertension: Stable and well control. We'll continue the use of diltiazem. On admission blood pressure was normal but in the soft side. After fluid is now WNL and safe to continue antihypertensive drugs.  8--DVT prophylaxis: Lovenox    LOS: 1 day   Harshitha Fretz [facility] [phone]  03/03/2011, 3:55 PM

## 2011-03-03 NOTE — ED Notes (Signed)
Upon placing pt on monitor for transport, pt states she is having chest pain, reports midsternal and states "congestion and my throat is sore." States its always there. Rates 8 of 10. Will medicate prior to transport.

## 2011-03-03 NOTE — Progress Notes (Signed)
Utilization Review Completed.  Ronnel Zuercher T  03/03/2011   

## 2011-03-03 NOTE — Progress Notes (Addendum)
INITIAL ADULT NUTRITION ASSESSMENT Date: 03/03/2011   Time: 10:15 AM Reason for Assessment: Consult, unintentional weight loss  ASSESSMENT: Female 78 y.o.  Dx: COPD exacerbation  Hx:  Past Medical History  Diagnosis Date  . Allergic rhinitis   . Diabetes mellitus   . COPD (chronic obstructive pulmonary disease)   . DVT (deep venous thrombosis)   . Syncope     [employer] loop recorder implantation 03/2009  . LV dysfunction     Mild, EF 45-50% by echocardiogram 02/2009  . Shortness of breath   . Asthma   . Hypertension   . Headache   . Anxiety   . Chronic cough   . Sinusitis   . Skin melanoma     "don't remember where"  . Supraventricular tachycardia     s/p RF ablation in 2000  . A-fib   . Blood transfusion   . Anemia   . H/O hiatal hernia   . Arthritis   . Angina     chronic chest pain    Related Meds:     . antiseptic oral rinse  15 mL Mouth Rinse BID  . aspirin  81 mg Oral Daily  . diltiazem  180 mg Oral Daily  . enoxaparin  40 mg Subcutaneous Q24H  . famotidine (PEPCID) IV  20 mg Intravenous Once  . gabapentin  300 mg Oral BID  . guaiFENesin  600 mg Oral BID  . ipratropium  0.5 mg Nebulization Once  . levalbuterol  0.63 mg Nebulization Once  . levalbuterol  1.25 mg Nebulization Once  . levofloxacin (LEVAQUIN) IV  250 mg Intravenous Q24H  . levofloxacin (LEVAQUIN) IV  500 mg Intravenous Once  . methylPREDNISolone (SOLU-MEDROL) injection  80 mg Intravenous Q6H  . montelukast  10 mg Oral Daily  .  morphine injection  4 mg Intravenous Once  . mulitivitamin with minerals  1 tablet Oral Daily  . ondansetron (ZOFRAN) IV  4 mg Intravenous Once  . ondansetron  4 mg Oral Q6H  . pantoprazole  40 mg Oral Q1200  . predniSONE  40 mg Oral Once  . simvastatin  20 mg Oral q1800  . Vitamin D (Ergocalciferol)  50,000 Units Oral Q Sun  . DISCONTD: albuterol  5 mg Nebulization Once  . DISCONTD: levofloxacin (LEVAQUIN) IV  500 mg Intravenous Q24H     Ht: 5\' 2"  (157.5  cm)  Wt: 90 lb 6.2 oz (41 kg)  Ideal Wt: 50.1 kg  % Ideal Wt: 81%  Usual Wt: 103 lbs, 46.8 kg (office visit, 01/12/2011) % Usual Wt: 87%  Body mass index is 16.53 kg/(m^2). Patient is underweight  Food/Nutrition Related Hx: patient has lost > 10 lbs in one month, as per indicated on nutrition risk assessment. Patient states she has had N/V, poor appetite and difficulty eating related to SOB. Patient states she drinks a nutrition shake called "Sure Thing" daily, this is probably Ensure.   Labs:  CMP     Component Value Date/Time   NA 132* 03/02/2011 1749   K 4.4 03/02/2011 1749   CL 95* 03/02/2011 1749   CO2 28 03/02/2011 1749   GLUCOSE 173* 03/02/2011 1749   BUN 16 03/02/2011 1749   CREATININE 0.59 03/03/2011 0238   CALCIUM 9.1 03/02/2011 1749   PROT 6.9 03/02/2011 1749   ALBUMIN 3.5 03/02/2011 1749   AST 22 03/02/2011 1749   ALT 18 03/02/2011 1749   ALKPHOS 82 03/02/2011 1749   BILITOT 0.2* 03/02/2011 1749   GFRNONAA  86* 03/03/2011 0238   GFRAA >90 03/03/2011 0238    CBG (last 3)   Basename 03/03/11 0741  GLUCAP 183*     Intake/Output Summary (Last 24 hours) at 03/03/11 1019 Last data filed at 03/03/11 0700  Gross per 24 hour  Intake    520 ml  Output      0 ml  Net    520 ml     Diet Order: Carb Control  Supplements/Tube Feeding: none  IVF:    sodium chloride Last Rate: 100 mL/hr at 03/03/11 0254    Estimated Nutritional Needs:   Kcal: 1400-1600  Protein: 70-80 gm Fluid: >1.5 L  Patient is very concerned about nutrition. RD educated patient about making high calorie/protein choices for meals to help with weight gain. Patient states she typically eats lots of vegetables. RD gave patient information about high calorie foods and ways to ways to prepare/eat meals with COPD to increase intake. Patient verbalized understanding. Patient is willing to try and drink Glucerna shakes with meals while here.  Patients weight loss is 13 lbs, or 12.6% in ~ 7 weeks. Patient also  appears to have mild body fat loss. This meets criteria for non-severe malnutrition in the context of chronic illness.   NUTRITION DIAGNOSIS: -Inadequate oral intake (NI-2.1).  Status: Ongoing  RELATED TO: SOB with eating and meal preparation  AS EVIDENCE BY: weight loss  MONITORING/EVALUATION(Goals): Goal: PO intake of meals and supplements to meet >/= 90% of estimated nutrition needs Monitor: PO intake, weight, labs, I/O's  EDUCATION NEEDS: -No education needs identified at this time  INTERVENTION: 1. Add Glucerna with meals BID 2. RD to follow care plan.   Dietitian [phone]  DOCUMENTATION CODES Per approved criteria  -Non-severe (moderate) malnutrition in the context of chronic illness    KOWALSKI, Fardeen Steinberger MARIE 03/03/2011, 10:15 AM

## 2011-03-03 NOTE — Progress Notes (Signed)
   CARE MANAGEMENT NOTE 03/03/2011  Patient:  [name], [name]   Account Number:  [id number]  Date Initiated:  03/03/2011  Documentation initiated by:  Junius Creamer  Subjective/Objective Assessment:   adm w copd exacerbation     Action/Plan:   lives w son, pcp dr cammie fulp, pt has no hx of hhc, she does have home o2-neb-walker   Anticipated DC Date:  03/05/2011   Anticipated DC Plan:  HOME W HOME HEALTH SERVICES      DC Planning Services  CM consult      Choice offered to / List presented to:             Status of service:   Medicare Important Message given?   (If response is "NO", the following Medicare IM given date fields will be blank) Date Medicare IM given:   Date Additional Medicare IM given:    Discharge Disposition:    Per UR Regulation:  Reviewed for med. necessity/level of care/duration of stay  Comments:  1/18 supp son, no hx of hhc, not sure if she will need anything or not. will follow. debbie Christie Copley rnbsn [phone]
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
## 2011-03-03 NOTE — ED Notes (Signed)
Pt reports chest soreness that increases with cough or deep breathing
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
## 2011-03-03 NOTE — Progress Notes (Signed)
Clinical Social Work Intern completed psychosocial assessment with patient regarding grief issues and assessment form in shadow chart.  Patient declined resources for counseling.  CSW signing off as no other services needed at this time.  Genelle Bal, MSW, LCSW [phone]

## 2011-03-04 DIAGNOSIS — J441 Chronic obstructive pulmonary disease with (acute) exacerbation: Secondary | ICD-10-CM | POA: Diagnosis not present

## 2011-03-04 DIAGNOSIS — E871 Hypo-osmolality and hyponatremia: Secondary | ICD-10-CM | POA: Diagnosis not present

## 2011-03-04 DIAGNOSIS — I959 Hypotension, unspecified: Secondary | ICD-10-CM | POA: Diagnosis not present

## 2011-03-04 LAB — HEMOGLOBIN A1C: Mean Plasma Glucose: 126 mg/dL — ABNORMAL HIGH (ref <117)

## 2011-03-04 LAB — BASIC METABOLIC PANEL
BUN: 14 mg/dL (ref 6–23)
CO2: 30 mEq/L (ref 19–32)
Chloride: 96 mEq/L (ref 96–112)
GFR calc Af Amer: >90 mL/min (ref >90)
Potassium: 4.2 mEq/L (ref 3.5–5.1)

## 2011-03-04 LAB — GLUCOSE, CAPILLARY: Glucose-Capillary: 177 mg/dL — ABNORMAL HIGH (ref 70–99)

## 2011-03-04 LAB — CBC
HCT: 36 % (ref 36.0–46.0)
RBC: 3.85 MIL/uL — ABNORMAL LOW (ref 3.87–5.11)
RDW: 14 % (ref 11.5–15.5)
WBC: 6.8 10*3/uL (ref 4.0–10.5)

## 2011-03-04 MED ORDER — PREDNISONE 20 MG PO TABS: ORAL_TABLET | ORAL | Status: DC

## 2011-03-04 MED ORDER — LEVOFLOXACIN 500 MG PO TABS: 500.0000 mg | ORAL_TABLET | Freq: Every day | ORAL | Status: AC

## 2011-03-04 MED ORDER — POLYETHYLENE GLYCOL 3350 17 G PO PACK
17.0000 g | PACK | Freq: Every day | ORAL | Status: DC
  Administered 2011-03-04: 17 g via ORAL
  Filled 2011-03-04: qty 1

## 2011-03-04 MED ORDER — GUAIFENESIN ER 600 MG PO TB12: 600.0000 mg | ORAL_TABLET | Freq: Two times a day (BID) | ORAL | Status: DC

## 2011-03-04 MED ORDER — BUDESONIDE 180 MCG/ACT IN AEPB: 1.0000 | INHALATION_SPRAY | Freq: Two times a day (BID) | RESPIRATORY_TRACT | Status: DC

## 2011-03-04 NOTE — Progress Notes (Signed)
Pt to be discharged today. IV site removed and telemetry removed. Discharge instructions reviewed with patient. Harless Litten.RN 03/04/11

## 2011-03-04 NOTE — Discharge Summary (Signed)
Physician Discharge Summary  Patient ID: [name] MRN: [id number] DOB/AGE: December 03, 1931 78 y.o.  Admit date: 03/02/2011 Discharge date: 03/04/2011  Primary Care Physician:  Candi Leash, MD, MD   Discharge Diagnoses:   1-COPD exacerbation 2-rhonchi disease/bronchiectasis  3-Hyponatremia 4-Hypertension 5-Diabetes mellitus 6-GERD 7-HLD  Current Discharge Medication List    START taking these medications   Details  budesonide (PULMICORT) 180 MCG/ACT inhaler Inhale 1 puff into the lungs 2 (two) times daily. Qty: 1 Inhaler, Refills: 1    guaiFENesin (MUCINEX) 600 MG 12 hr tablet Take 1 tablet (600 mg total) by mouth 2 (two) times daily. Qty: 30 tablet, Refills: 0    levofloxacin (LEVAQUIN) 500 MG tablet Take 1 tablet (500 mg total) by mouth daily. Qty: 8 tablet, Refills: 0      CONTINUE these medications which have CHANGED   Details  predniSONE (DELTASONE) 20 MG tablet Take 3 tablets by mouth daily x2 days; then 2 tablets by mouth daily x2 days; then 1 tablet by mouth daily x2 days; then half tablet by mouth daily x3 days and stop prednisone. Qty: 15 tablet, Refills: 0      CONTINUE these medications which have NOT CHANGED   Details  acetaminophen (TYLENOL) 500 MG tablet Take 500 mg by mouth every 6 (six) hours as needed. For pain    aspirin 81 MG tablet Take 81 mg by mouth daily.     Cholecalciferol (VITAMIN D3) 50000 UNITS CAPS Take 50,000 Units by mouth once a week. On Sundays    diltiazem (CARDIZEM CD) 180 MG 24 hr capsule Take 1 capsule (180 mg total) by mouth daily. Qty: 30 capsule, Refills: 6    gabapentin (NEURONTIN) 300 MG capsule Take 1 capsule (300 mg total) by mouth 2 (two) times daily. Qty: 60 capsule, Refills: 0    HYDROcodone-homatropine (HYCODAN) 5-1.5 MG/5ML syrup Take 5 mLs by mouth every 4 (four) hours as needed. For coughing    levalbuterol (XOPENEX) 0.63 MG/3ML nebulizer solution Take 0.63 mg by nebulization every 6 (six) hours as needed. For  shortness of breath    montelukast (SINGULAIR) 10 MG tablet Take 1 tablet by mouth daily.    Multiple Vitamin (MULTIVITAMIN) tablet Take 1 tablet by mouth daily.     omeprazole (PRILOSEC) 20 MG capsule Take 20 mg by mouth 2 (two) times daily.    pravastatin (PRAVACHOL) 40 MG tablet Take 40 mg by mouth daily.      tiotropium (SPIRIVA) 18 MCG inhalation capsule Place 18 mcg into inhaler and inhale daily.        STOP taking these medications     ondansetron (ZOFRAN) 4 MG tablet      azithromycin (ZITHROMAX) 250 MG tablet          Disposition and Follow-up:  Patient discharged in a stable and improved condition, currently without any significant chest pain, abdominal pain, nausea/vomiting; no wheezing on auscultation and able to tolerate by mouth medications and diet properly. Patient will follow with primary care physician over the next 10 days and with her pulmonologist Avonex 2 weeks. She had been instructed to finish prednisone tapering dose, antibiotic therapy and also to use Pulmicort twice a day as part of her regimen to prevent further COPD exacerbation. At home health [facility] has been also arrange in order for the patient to have good compliance with her medication.  The rest of her medical problems remains stable during this hospitalization and she will follow with PCP as an outpatient, for further adjustment  of her regimen as needed.  Consults:   None   Significant Diagnostic Studies:  Dg Chest 2 View  03/02/2011  *RADIOLOGY REPORT*  Clinical Data: Chest pain and shortness of breath with cough and back pain.  CHEST - 2 VIEW  Comparison: 02/17/2011. CT chest 03/08/2009.  Findings: Trachea is midline.  Heart size normal.  Right hilar prominence appears unchanged over several prior exams.  Lungs are hyperinflated.  A nodular density in the lower right hemithorax appears more prominent than on 03/08/2009. No airspace consolidation or pleural fluid.  IMPRESSION: Nodular opacity at  the base of the right hemithorax appears more prominent than on  03/08/2009.  Non emergent CT chest without contrast could be performed in further evaluation, as clinically indicated. These results will be called to the ordering clinician or representative by the Radiologist Assistant, and communication documented in the PACS Dashboard.  Original Report Authenticated By: Reyes Ivan, M.D.   Ct Chest Wo Contrast  03/03/2011  *RADIOLOGY REPORT*  Clinical Data: Possible right-sided lung nodule.  Hypertension. Diabetes.  CT CHEST WITHOUT CONTRAST  Technique:  Multidetector CT imaging of the chest was performed following the standard protocol without IV contrast.  Comparison: Plain film 03/02/2011.  Most recent chest CT of 03/08/2009.  Findings: Lung windows demonstrate right-sided tracheal diverticulum on image 9 is unchanged. Mild centrilobular emphysema. Subtle right upper lobe peribronchovascular reticular opacities. Example image 18 of series 3.  These are new since the prior CT. Similar thickening along the right minor fissure on transverse image 29 and sagittal image 30. 7 mm right middle lobe nodule on the prior exam is less apparent. Today it measures approximately 5 mm, including on image 40.  There is newly increased right middle lobe and anterior right lower lobe bronchial wall thickening. The plain film abnormality is without pulmonary parenchymal correlate.  There are areas of anterolateral right pleural thickening which may have caused this appearance on plain film. Focal bronchiectasis or partially fluid-filled blebs are similar in the medial left upper lobe on image 33. Mild loss/scarring within the left lower lobe dependently.  Soft tissue windows demonstrate normal heart size with a mild pectus excavatum deformity. Multivessel coronary artery atherosclerosis.  No pericardial or pleural effusion.  No mediastinal or definite hilar adenopathy, given limitations of unenhanced CT.  Air fluid level  of thoracic esophagus on image 24.  Limited abdominal imaging demonstrates artifact versus a sub centimeter low density left hepatic lobe lesion on image 60, indeterminate.  Not definitely present on the prior exam.  Minimal hyperattenuation along the interpolar right kidney on image 61 is indeterminate but likely related to a tiny hemorrhagic cyst.  Mild osteopenia.  A posterolateral right 10th rib fracture is nonacute but new since the prior of 03/08/2009.  This accounted for the plain film abnormality.  IMPRESSION:  1.  The plain film abnormality was secondary to a nonacute/healing posterior right ninth rib fracture. 2.  No suspicious pulmonary nodule. 3.  Scattered areas of new or progressive peribronchovascular micro nodularity and bronchial wall thickening.  Suspect infection, including atypical etiologies such as Mycobacterium avium intracellular.  This is of indeterminate acuity. 4.  Mild underlying centrilobular emphysema. 5.  Possible sub centimeter low density liver lesion.  Nonspecific. If there is a history of primary malignancy or a concern of liver disease, dedicated outpatient contrast enhanced MRI should be considered.  Original Report Authenticated By: Consuello Bossier, M.D.    Brief H and P: 78 year old female with a past medical  history significant for COPD, diabetes, hypertension, hyperlipidemia care; who came to the hospital secondary to increase shortness of breath. Patient with multiple recent admissions secondary to COPD exacerbation. Please refer to history of present illness note for further details of the reason for admission.  Hospital Course:  1-COPD exacerbation: Most likely secondary to medication noncompliance and bronchitis/bronchiectasis. Patient responded well to the use of steroids, nebulizer treatments and antibiotics. At this moment we're going to a Pulmicort to her chronic regimen, plan is to taper his steroids over the next 10 days, and finish antibiotic therapy. She has  been advised to arrange followup with primary care physician and also with Dr. Maple Hudson (pulmonologist) for further adjustment of her COPD medication regimen.   2-Diabetes mellitus: A1c 6.0; no medication indicated at this point. Patient advised to follow a low carbohydrate diet and to continue followup with PCP for monitoring sedation.    3-Hyponatremia: Secondary to mild dehydration on admission due to poor intake. Resolve after fluid resuscitation.  4-Lung nodule: On x-ray these findings where concern for malignancy; CT scan demonstrated to be associated with a previous rib fracture calluses formation. No further workup indicated at this point.  5-hyperlipidemia: Continue statins.   6-GERD: Continue PPI.   7-hypertension: Patient advised to follow a heart healthy diet, and to continue current medication regimen; further instructions and medication adjustment to be done by primary care physician during her followup.  Time spent on Discharge: 45 minutes  Signed: Gizell Danser 03/04/2011, 12:54 PM

## 2011-03-05 ENCOUNTER — Other Ambulatory Visit: Payer: Self-pay

## 2011-03-05 ENCOUNTER — Emergency Department (HOSPITAL_COMMUNITY)
Admission: EM | Admit: 2011-03-05 | Discharge: 2011-03-05 | Disposition: A | Discharge status: Home / Self Care | Payer: Medicare Other | Attending: Emergency Medicine | Admitting: Emergency Medicine

## 2011-03-05 ENCOUNTER — Emergency Department (HOSPITAL_COMMUNITY): Payer: Medicare Other

## 2011-03-05 ENCOUNTER — Encounter (HOSPITAL_COMMUNITY): Payer: Self-pay

## 2011-03-05 DIAGNOSIS — J40 Bronchitis, not specified as acute or chronic: Secondary | ICD-10-CM | POA: Insufficient documentation

## 2011-03-05 DIAGNOSIS — Z7982 Long term (current) use of aspirin: Secondary | ICD-10-CM | POA: Diagnosis not present

## 2011-03-05 DIAGNOSIS — Z86718 Personal history of other venous thrombosis and embolism: Secondary | ICD-10-CM | POA: Insufficient documentation

## 2011-03-05 DIAGNOSIS — M129 Arthropathy, unspecified: Secondary | ICD-10-CM | POA: Diagnosis not present

## 2011-03-05 DIAGNOSIS — R059 Cough, unspecified: Secondary | ICD-10-CM | POA: Insufficient documentation

## 2011-03-05 DIAGNOSIS — I4891 Unspecified atrial fibrillation: Secondary | ICD-10-CM | POA: Insufficient documentation

## 2011-03-05 DIAGNOSIS — I10 Essential (primary) hypertension: Secondary | ICD-10-CM | POA: Insufficient documentation

## 2011-03-05 DIAGNOSIS — J4489 Other specified chronic obstructive pulmonary disease: Secondary | ICD-10-CM | POA: Insufficient documentation

## 2011-03-05 DIAGNOSIS — J449 Chronic obstructive pulmonary disease, unspecified: Secondary | ICD-10-CM | POA: Diagnosis not present

## 2011-03-05 DIAGNOSIS — R079 Chest pain, unspecified: Secondary | ICD-10-CM | POA: Insufficient documentation

## 2011-03-05 DIAGNOSIS — R05 Cough: Secondary | ICD-10-CM | POA: Diagnosis not present

## 2011-03-05 DIAGNOSIS — R0602 Shortness of breath: Secondary | ICD-10-CM | POA: Insufficient documentation

## 2011-03-05 DIAGNOSIS — Z79899 Other long term (current) drug therapy: Secondary | ICD-10-CM | POA: Insufficient documentation

## 2011-03-05 LAB — BASIC METABOLIC PANEL
BUN: 16 mg/dL (ref 6–23)
Calcium: 8.8 mg/dL (ref 8.4–10.5)
GFR calc Af Amer: >90 mL/min (ref >90)
GFR calc non Af Amer: >90 mL/min (ref >90)
Glucose, Bld: 174 mg/dL — ABNORMAL HIGH (ref 70–99)

## 2011-03-05 LAB — CBC
MCH: 31.8 pg (ref 26.0–34.0)
MCHC: 33.6 g/dL (ref 30.0–36.0)
Platelets: 250 10*3/uL (ref 150–400)
RDW: 14.4 % (ref 11.5–15.5)

## 2011-03-05 MED ORDER — LEVALBUTEROL HCL 1.25 MG/0.5ML IN NEBU
1.2500 mg | INHALATION_SOLUTION | RESPIRATORY_TRACT | Status: AC
  Administered 2011-03-05 (×3): 1.25 mg via RESPIRATORY_TRACT
  Filled 2011-03-05 (×3): qty 0.5

## 2011-03-05 MED ORDER — IPRATROPIUM BROMIDE 0.02 % IN SOLN
0.5000 mg | Freq: Once | RESPIRATORY_TRACT | Status: AC
  Administered 2011-03-05: 0.5 mg via RESPIRATORY_TRACT
  Filled 2011-03-05: qty 2.5

## 2011-03-05 MED ORDER — OXYCODONE-ACETAMINOPHEN 5-325 MG PO TABS: 1.0000 | ORAL_TABLET | ORAL | Status: AC | PRN

## 2011-03-05 MED ORDER — PREDNISONE 20 MG PO TABS
60.0000 mg | ORAL_TABLET | Freq: Once | ORAL | Status: AC
  Administered 2011-03-05: 60 mg via ORAL
  Filled 2011-03-05: qty 3

## 2011-03-05 MED ORDER — PREDNISONE 20 MG PO TABS: 60.0000 mg | ORAL_TABLET | Freq: Every day | ORAL | Status: DC

## 2011-03-05 MED ORDER — OXYCODONE-ACETAMINOPHEN 5-325 MG PO TABS
2.0000 | ORAL_TABLET | Freq: Once | ORAL | Status: AC
  Administered 2011-03-05: 2 via ORAL
  Filled 2011-03-05: qty 2

## 2011-03-05 NOTE — ED Notes (Signed)
Pt c/o sob, coughing. Pt states that she was d/c yesterday from the hospital for COPD exacerbation. Pt states it usually happens when she is around her dogs.

## 2011-03-05 NOTE — ED Notes (Signed)
The pt is coughing her stas are running low.  hhn  xopenex now

## 2011-03-05 NOTE — ED Notes (Signed)
Pt currently in X-ray.

## 2011-03-05 NOTE — ED Notes (Signed)
The pt says she has an irregular heart rate.  She appears to be in af [place]

## 2011-03-05 NOTE — ED Notes (Signed)
The pt says she is breathing better still coughing.  Finishing her 3rd hhn

## 2011-03-05 NOTE — ED Notes (Signed)
The pts son has  Arrived he is talking to dr Effie Shy.  The pt is still insistant that she goes home

## 2011-03-05 NOTE — Progress Notes (Signed)
   CARE MANAGEMENT NOTE 03/05/2011  Patient:  [name], [name]   Account Number:  [id number]  Date Initiated:  03/03/2011  Documentation initiated by:  Junius Creamer  Subjective/Objective Assessment:   adm w copd exacerbation     Action/Plan:   lives w son, pcp dr cammie fulp, pt has no hx of hhc, she does have home o2-neb-walker   Anticipated DC Date:  03/05/2011   Anticipated DC Plan:  HOME W HOME HEALTH SERVICES      DC Planning Services  CM consult      [facility] Choice  HOME HEALTH   Choice offered to / List presented to:  C-1 Patient        HH arranged  HH-1 RN      [facility] agency  [facility]   Status of service:  Completed, signed off Medicare Important Message given?   (If response is "NO", the following Medicare IM given date fields will be blank) Date Medicare IM given:   Date Additional Medicare IM given:    Discharge Disposition:  HOME W HOME HEALTH SERVICES  Per UR Regulation:  Reviewed for med. necessity/level of care/duration of stay  Comments:  03/05/2011 1750 Spoke to Unit RN and [facility] RN was not discussed with pt at d/c. Pt was in a hurry to d/c per unit RN. Contacted pt at home to offer choice for [facility]. Pt decided on [facility] for [facility] RN. States she could not write info because was in bed. Contacted [facility] with referral and they have a start of care on Wed or Thurs. Genevieve Norlander can do start of care on 03/06/11. Faxed orders, [id number], d/c summary and facesheet. Isidoro Donning RN CCM Case Mgmt phone [phone]  1/18 supp son, no hx of hhc, not sure if she will need anything or not. will follow. debbie dowell rnbsn [phone]
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
## 2011-03-05 NOTE — ED Notes (Signed)
The p says she just went home from the hospital 2-3 days ago and had to come back today because she is sick

## 2011-03-05 NOTE — ED Notes (Addendum)
Pt is very agitated and stated "I don't want to die!" and "I'm going..I'm going.Marland KitchenMarland KitchenI can't breathe." Pt was monitored by this RN and was breathing and NAD was noted. MD notified of pt status.

## 2011-03-05 NOTE — ED Notes (Signed)
The pt is insisting on going home.  Her stas not staying up with or without 02.  She is getting more agitated about going home

## 2011-03-05 NOTE — ED Notes (Signed)
Pt and family updated on care. Pt resting in bed.

## 2011-03-06 NOTE — ED Provider Notes (Signed)
Medical screening examination/treatment/procedure(s) were conducted as a shared visit with non-physician practitioner(s) and myself.  I personally evaluated the patient during the encounter  Loren Racer, MD 03/06/11 [phone]

## 2011-03-07 ENCOUNTER — Telehealth: Payer: Self-pay | Admitting: Internal Medicine

## 2011-03-07 NOTE — Telephone Encounter (Signed)
Spoke with pt's son Gerlene Burdock. He states that pt was recently in the hospital from 1/17-1/19 and then taken to ED again on 1/20 for COPD exacerbation. He states that the pt continues to have increased SOB and requests to be seen here asap. I offered ov with CDY for Friday 03/10/11, but he does not feel that she can wait this long. Pt states okay to see TP tomorrow. Will wait and talk with CDY to see if this is fine with him. Please advise, thanks!

## 2011-03-07 NOTE — Telephone Encounter (Signed)
Per CDY: okay to see TP tomorrow for SOB.   Called spoke with Gerlene Burdock, informed him that CDY has no openings tomorrow and that he okay'd for pt to see TP.  appt scheduled with TP 1.23.13 @ 1015.  Nothing further needed.

## 2011-03-08 ENCOUNTER — Ambulatory Visit (INDEPENDENT_AMBULATORY_CARE_PROVIDER_SITE_OTHER): Payer: Medicare Other | Admitting: Adult Health

## 2011-03-08 ENCOUNTER — Encounter: Payer: Self-pay | Admitting: Adult Health

## 2011-03-08 VITALS — BP 144/86 | HR 76 | Temp 96.8°F | Ht 62.0 in | Wt 92.2 lb

## 2011-03-08 DIAGNOSIS — J441 Chronic obstructive pulmonary disease with (acute) exacerbation: Secondary | ICD-10-CM | POA: Diagnosis not present

## 2011-03-08 MED ORDER — BUDESONIDE 0.25 MG/2ML IN SUSP: 0.2500 mg | Freq: Every day | RESPIRATORY_TRACT | Status: DC

## 2011-03-08 MED ORDER — PREDNISONE 10 MG PO TABS: ORAL_TABLET | ORAL | Status: DC

## 2011-03-08 MED ORDER — FORMOTEROL FUMARATE 20 MCG/2ML IN NEBU: 20.0000 ug | INHALATION_SOLUTION | Freq: Two times a day (BID) | RESPIRATORY_TRACT | Status: DC

## 2011-03-08 NOTE — Patient Instructions (Addendum)
Finish Antibitoic .  Hold Prednisone at 20mg  daily -sent to pharmacy  Begin Budesonide and Perforomist NEB Twice daily  -sent to [facility].  Stop Pulmiocort inhaler.  Continue on Spiriva daily  Mucinex DM Twice daily  As needed  Cough/congestion  follow up 1 week Dr. Maple Hudson  And As needed   Please contact office for sooner follow up if symptoms do not improve or worsen or seek emergency care

## 2011-03-08 NOTE — Assessment & Plan Note (Addendum)
Recurrent exacerbation suspect with component of medication confusion  She is a never smoker- unable to find a prev. Spirometry -once better will try to attempt spirometry Suspect has underlying asthma component. - she is not taking her pulmicort  Checked with pharmacy not on ACE inhibitor or beta blocker.  For now begin Budesonide and Perforomist to NEb Twice daily  -set up with DME (she does take and is able to do her own nebs )  So may be a better route for her.  Also may need to check CT sinus as prev ct head with pan sinus as this could be contributing to her flares.  ?seen ENT as recommended in past. Dr. Jenne Pane  ?MAI on CT chest will need to cont - would not tolerate tx regimen.   Continue on Spiriva .  encourgaed on ensure to help with malnutrition.  Also keep prednisone at 20mg  as may help with AB flare and appetite.  Close follow up with Dr. Maple Hudson  In 1 week

## 2011-03-08 NOTE — Progress Notes (Signed)
Patient ID: [name], female    DOB: 1931/04/26, 78 y.o.   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  Followed for COPD, complicated by chronic sinusitis, rhinitis. Last here April 12, 2010 after hosp for COPD exacerbation. Older sister was chain smoker who died of emphysema.  Now here for post hospital f/u after hosp 5/28-29/12 for exacerb asthma/ COPD. She hurried home early to care for debilitated husband. She blames this admission on trial of new "Advanced Aspirin" taken for joint pain. No prior prob with aspirin, but usually has used tylenol.  Today feels well, a little bit tight. Now tapering prednisone and no concerns with current meds.   08/11/10- Breathing is fair- aware of some shortness, of breath and using her rescue inhaler 0-3/day. Stuffy nose- denies need for treatment.  Incidental shingles across left breast- Taking a pill twice daily. No fever. Denies chest infection. Husband- Hospice for prostate cancer.  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  Followed for COPD, complicated by chronic sinusitis, rhinitis. Husband died and she admits being depressed, having difficulty coping. Hasn't felt well for 10 days with increased cough. Sputum is stained orange by cough syrup but otherwise not purulent. Denies fever chest pain, blood or swollen glands. Using rescue inhaler frequently. Went to the emergency room one month ago for shortness of breath.  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  Followed for COPD, complicated by chronic sinusitis, rhinitis Has had flu shot. Hospitalized several times, once for SVT, possibly aggravated by her nebulizer use. Latest was for COPD exacerbation. Hospital notes reviewed. Ventilation perfusion lung scan on November 19 was negative for PE but showed air trapping consistent with COPD. CT scan of  head done 12/23/2010 showed pan sinusitis with an air-fluid level in the left maxillary sinus. She complains of frontal headache and chronic nasal congestion. Chest feels comfortable but she admits that he persistent productive cough with white to yellow sputum. No blood. She's not sure about fever. She has been using her home nebulizer only occasionally because she thinks the albuterol solution burns her mouth.  03/08/2011 Acute work in (son with her today  )  Pt presents for an acute ov. Complains of breathing no better since hosp discharge > still having increased SOB, prod cough with yellow mucus, tightness/pressure in chest  - still taking levaquin and pred taper.  Admitted January 17 through March 04, 2011 for COPD, exacerbation. She was treated with IV antibiotics, steroids, and nebulizer treatments. She was started on Pulmicort inhaler. At discharge along with antibiotic, and steroids. She is currently on 10 mg of prednisone. And is taking Levaquin. It is clear. The patient is not clear what medication she is supposed to be on. Also,son  help her at home, but he works full-time and is also unclear what medication. She is on. We contacted the pharmacy, however, was unable to determine if she is back. She taken her medicines or not. She appears very cachectic and continues to have weight loss. Son says that she does not eat very well. We talked about insures supplementation. Also, discussed that patient may not be safe at home by herself and suggested consideration of nursing home placement. However, they declined at this time.  She denies any hemoptysis, chest pain, vomiting, or leg swelling.  Review of stems: See HPI Constitutional:   + weight  loss,  NO night sweats,  Fevers, chills,  +fatigue, or  lassitude.  HEENT:   No headaches,  Difficulty swallowing,  Tooth/dental problems, or  Sore throat,                No sneezing, itching, ear ache, nasal congestion, post nasal drip,   CV:  No  chest pain,  Orthopnea, PND, swelling in lower extremities, anasarca, dizziness, palpitations, syncope.   GI  No heartburn, indigestion, abdominal pain, nausea, vomiting, diarrhea, change in bowel habits, loss of appetite, bloody stools.   Resp:  No coughing up of blood.     No chest wall deformity  Skin: no rash or lesions.  GU: no dysuria, change in color of urine, no urgency or frequency.  No flank pain, no hematuria   MS:  No joint pain or swelling.  No decreased range of motion.  No back pain.  Psych:  No change in mood or affect. No depression or anxiety.  No memory loss.          Objective:  GEN: A/Ox3; pleasant , NAD, elderly , very cachexic , thin , chronically ill appearing.   HEENT:  [place]/AT,  EACs-clear, TMs-wnl, NOSE-clear, THROAT-clear, no lesions, no postnasal drip or exudate noted.   NECK:  Supple w/ fair ROM; no JVD; normal carotid impulses w/o bruits; no thyromegaly or nodules palpated; no lymphadenopathy.  RESP  Coarse BS w/ scattered rhonchi bilaterally ,no accessory muscle use, no dullness to percussion  CARD:  RRR, no m/r/g  , no peripheral edema, pulses intact, no cyanosis or clubbing.  GI:   Soft & nt; nml bowel sounds; no organomegaly or masses detected.  Musco: Warm bil, no deformities or joint swelling noted.   Neuro: alert, no focal deficits noted.    Skin: Warm, no lesions or rashes

## 2011-03-09 MED ORDER — ALBUTEROL SULFATE HFA 108 (90 BASE) MCG/ACT IN AERS: 2.0000 | INHALATION_SPRAY | Freq: Four times a day (QID) | RESPIRATORY_TRACT | Status: DC | PRN

## 2011-03-09 NOTE — Progress Notes (Signed)
Addended by: Boone Master E on: 03/09/2011 02:33 PM   Modules accepted: Orders

## 2011-03-13 DIAGNOSIS — K5900 Constipation, unspecified: Secondary | ICD-10-CM | POA: Diagnosis not present

## 2011-03-13 DIAGNOSIS — J449 Chronic obstructive pulmonary disease, unspecified: Secondary | ICD-10-CM | POA: Diagnosis not present

## 2011-03-14 ENCOUNTER — Encounter: Payer: Self-pay | Admitting: Pulmonary Disease

## 2011-03-14 ENCOUNTER — Encounter: Payer: Self-pay | Admitting: Internal Medicine

## 2011-03-14 ENCOUNTER — Ambulatory Visit: Payer: Medicare Other | Admitting: Internal Medicine

## 2011-03-15 ENCOUNTER — Encounter: Payer: Self-pay | Admitting: Adult Health

## 2011-03-15 ENCOUNTER — Telehealth: Payer: Self-pay | Admitting: Internal Medicine

## 2011-03-15 ENCOUNTER — Ambulatory Visit (INDEPENDENT_AMBULATORY_CARE_PROVIDER_SITE_OTHER): Payer: Medicare Other | Admitting: Adult Health

## 2011-03-15 ENCOUNTER — Telehealth: Payer: Self-pay | Admitting: Adult Health

## 2011-03-15 VITALS — BP 112/82 | HR 98 | Temp 96.9°F | Ht 62.0 in | Wt 90.2 lb

## 2011-03-15 DIAGNOSIS — J441 Chronic obstructive pulmonary disease with (acute) exacerbation: Secondary | ICD-10-CM

## 2011-03-15 MED ORDER — BUDESONIDE 0.25 MG/2ML IN SUSP: 0.2500 mg | Freq: Two times a day (BID) | RESPIRATORY_TRACT | Status: DC

## 2011-03-15 NOTE — Telephone Encounter (Signed)
Per [facility] okay for pt to see TP today.  Thanks.  Called spoke with pt's son Gerlene Burdock, appt scheduled with TP today @ 1045.  Nothing further needed.  Will sign off.

## 2011-03-15 NOTE — Patient Instructions (Addendum)
Hold Prednisone at 20mg  daily Continue on  Budesonide and Perforomist NEB Twice daily   Restart  Spiriva daily  Mucinex DM Twice daily  As needed  Cough/congestion  Make sure to take Prilosec 20mg  Twice daily   May take Mylanta for heartburn /burning in stomach.  follow up 2 week Dr. Maple Hudson  And As needed   Please contact office for sooner follow up if symptoms do not improve or worsen or seek emergency care

## 2011-03-15 NOTE — Telephone Encounter (Signed)
I spoke with pt son and he wanted to confirm all the medications we had on our list with the medication bottles he has at home for pt. He states if he can't find the one's that we have prescribed for her then he will call back for rx. Nothing further needed at this time

## 2011-03-15 NOTE — Progress Notes (Signed)
Patient ID: [name], female    DOB: 1931/08/20, 78 y.o.   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  Followed for COPD, complicated by chronic sinusitis, rhinitis. Last here April 12, 2010 after hosp for COPD exacerbation. Older sister was chain smoker who died of emphysema.  Now here for post hospital f/u after hosp 5/28-29/12 for exacerb asthma/ COPD. She hurried home early to care for debilitated husband. She blames this admission on trial of new "Advanced Aspirin" taken for joint pain. No prior prob with aspirin, but usually has used tylenol.  Today feels well, a little bit tight. Now tapering prednisone and no concerns with current meds.   08/11/10- Breathing is fair- aware of some shortness, of breath and using her rescue inhaler 0-3/day. Stuffy nose- denies need for treatment.  Incidental shingles across left breast- Taking a pill twice daily. No fever. Denies chest infection. Husband- Hospice for prostate cancer.  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  Followed for COPD, complicated by chronic sinusitis, rhinitis. Husband died and she admits being depressed, having difficulty coping. Hasn't felt well for 10 days with increased cough. Sputum is stained orange by cough syrup but otherwise not purulent. Denies fever chest pain, blood or swollen glands. Using rescue inhaler frequently. Went to the emergency room one month ago for shortness of breath.  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  Followed for COPD, complicated by chronic sinusitis, rhinitis Has had flu shot. Hospitalized several times, once for SVT, possibly aggravated by her nebulizer use. Latest was for COPD exacerbation. Hospital notes reviewed. Ventilation perfusion lung scan on November 19 was negative for PE but showed air trapping consistent with COPD. CT scan of  head done 12/23/2010 showed pan sinusitis with an air-fluid level in the left maxillary sinus. She complains of frontal headache and chronic nasal congestion. Chest feels comfortable but she admits that he persistent productive cough with white to yellow sputum. No blood. She's not sure about fever. She has been using her home nebulizer only occasionally because she thinks the albuterol solution burns her mouth.  03/08/2011 Acute work in (son with her today  )  Pt presents for an acute ov. Complains of breathing no better since hosp discharge > still having increased SOB, prod cough with yellow mucus, tightness/pressure in chest  - still taking levaquin and pred taper.  Admitted January 17 through March 04, 2011 for COPD, exacerbation. She was treated with IV antibiotics, steroids, and nebulizer treatments. She was started on Pulmicort inhaler. At discharge along with antibiotic, and steroids. She is currently on 10 mg of prednisone. And is taking Levaquin.  The patient is not clear what medication she is supposed to be on. Also,son  helps her at home, but he works full-time and is also unclear what medication she is on. We contacted the pharmacy, however, was unable to determine to clarify her meds.  She appears very cachectic and continues to have weight loss. Son says that she does not eat very well. We talked about ensure supplementation. Also, discussed that patient may not be safe at home by herself and suggested consideration of nursing home placement. However, they declined at this time. >rx budesonide and Perforomist. Continued on pred at 20 mg   03/15/2011 Follow up (son with her today ) Pt returns for follow up . She has finished  all her abx. Currently on Prenisone 10mg .  Does not feel that good. It appears her and her son are confused about her meds .  She was suppose to hold on prednisone at 20mg  daily . She did not start on perforomist.  We reviewed all her meds and called the pharmacy and  verified eds once again.  Stopped her Spiriva. Does complain of burning in her stomach/epigastric area. No chest pain No n/v/d. No bloody stools. No fever.  New list with updated meds was reviewed in detail with pt and son.      Review of stems: See HPI Constitutional:   + weight loss,  NO night sweats,  Fevers, chills,  +fatigue, or  lassitude.  HEENT:   No headaches,  Difficulty swallowing,  Tooth/dental problems, or  Sore throat,                No sneezing, itching, ear ache, nasal congestion, post nasal drip,   CV:  No chest pain,  Orthopnea, PND, swelling in lower extremities, anasarca, dizziness, palpitations, syncope.   GI  No heartburn, indigestion, nausea, vomiting, diarrhea, change in bowel habits, loss of appetite, bloody stools.   Resp:  No coughing up of blood.     No chest wall deformity  Skin: no rash or lesions.  GU: no dysuria, change in color of urine, no urgency or frequency.  No flank pain, no hematuria   MS:  No joint pain or swelling.  No decreased range of motion.  No back pain.  Psych:  No change in mood or affect. No depression or anxiety.  No memory loss.          Objective:  GEN: A/Ox3; pleasant , NAD, elderly , very cachexic , thin , chronically ill appearing.   HEENT:  [place]/AT,  EACs-clear, TMs-wnl, NOSE-clear, THROAT-clear, no lesions, no postnasal drip or exudate noted.   NECK:  Supple w/ fair ROM; no JVD; normal carotid impulses w/o bruits; no thyromegaly or nodules palpated; no lymphadenopathy.  RESP  Coarse BS w/ few  rhonchi bilaterally ,no accessory muscle use, no dullness to percussion  CARD:  RRR, no m/r/g  , no peripheral edema, pulses intact, no cyanosis or clubbing.  GI:   Soft ; nml bowel sounds; no organomegaly or masses detected., tender along mid epigastric region, no guarding or rebound.   Musco: Warm bil, no deformities or joint swelling noted.   Neuro: alert, no focal deficits noted.    Skin: Warm, no lesions or  rashes

## 2011-03-15 NOTE — Assessment & Plan Note (Signed)
Slow to resolve flare  Worked with pt and son on her medication regimen Want her to hold prednisone at 20mg  daily  Restart Spiriva.  Take both perforomist and budesonide Twice daily   Please contact office for sooner follow up if symptoms do not improve or worsen or seek emergency care

## 2011-03-15 NOTE — Telephone Encounter (Signed)
Pt saw TP on 03-08-11 for increased SOB, productive cough, chest pressure/tightness. She was instructed to f/u in 1 week with CY but had to cancel the appt because her son was sick and he brings her.  She states the pressure in her chest and tightness has not improved at all. She states the SOB and cough is much improved. She is requesting an appt today with CY or TP.  Dr. Maple Hudson has no available appts, please advise. Carron Curie, CMA

## 2011-03-16 NOTE — Progress Notes (Signed)
Addended by: Boone Master E on: 03/16/2011 12:18 PM   Modules accepted: Orders

## 2011-03-29 ENCOUNTER — Ambulatory Visit (INDEPENDENT_AMBULATORY_CARE_PROVIDER_SITE_OTHER): Payer: Medicare Other | Admitting: Internal Medicine

## 2011-03-29 ENCOUNTER — Encounter: Payer: Self-pay | Admitting: Internal Medicine

## 2011-03-29 VITALS — BP 122/60 | HR 55 | Ht 62.0 in | Wt 93.2 lb

## 2011-03-29 DIAGNOSIS — J4489 Other specified chronic obstructive pulmonary disease: Secondary | ICD-10-CM

## 2011-03-29 DIAGNOSIS — J449 Chronic obstructive pulmonary disease, unspecified: Secondary | ICD-10-CM | POA: Diagnosis not present

## 2011-03-29 DIAGNOSIS — J9610 Chronic respiratory failure, unspecified whether with hypoxia or hypercapnia: Secondary | ICD-10-CM

## 2011-03-29 NOTE — Progress Notes (Signed)
Patient ID: [name], female    DOB: 1931/08/20, 78 y.o.   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  Followed for COPD, complicated by chronic sinusitis, rhinitis. Last here April 12, 2010 after hosp for COPD exacerbation. Older sister was chain smoker who died of emphysema.  Now here for post hospital f/u after hosp 5/28-29/12 for exacerb asthma/ COPD. She hurried home early to care for debilitated husband. She blames this admission on trial of new "Advanced Aspirin" taken for joint pain. No prior prob with aspirin, but usually has used tylenol.  Today feels well, a little bit tight. Now tapering prednisone and no concerns with current meds.   08/11/10- Breathing is fair- aware of some shortness, of breath and using her rescue inhaler 0-3/day. Stuffy nose- denies need for treatment.  Incidental shingles across left breast- Taking a pill twice daily. No fever. Denies chest infection. Husband- Hospice for prostate cancer.  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  Followed for COPD, complicated by chronic sinusitis, rhinitis. Husband died and she admits being depressed, having difficulty coping. Hasn't felt well for 10 days with increased cough. Sputum is stained orange by cough syrup but otherwise not purulent. Denies fever chest pain, blood or swollen glands. Using rescue inhaler frequently. Went to the emergency room one month ago for shortness of breath.  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  Followed for COPD, complicated by chronic sinusitis, rhinitis Has had flu shot. Hospitalized several times, once for SVT, possibly aggravated by her nebulizer use. Latest was for COPD exacerbation. Hospital notes reviewed. Ventilation perfusion lung scan on November 19 was negative for PE but showed air trapping consistent with COPD. CT scan of  head done 12/23/2010 showed pan sinusitis with an air-fluid level in the left maxillary sinus. She complains of frontal headache and chronic nasal congestion. Chest feels comfortable but she admits that he persistent productive cough with white to yellow sputum. No blood. She's not sure about fever. She has been using her home nebulizer only occasionally because she thinks the albuterol solution burns her mouth.  03/08/2011 Acute work in (son with her today  )  Pt presents for an acute ov. Complains of breathing no better since hosp discharge > still having increased SOB, prod cough with yellow mucus, tightness/pressure in chest  - still taking levaquin and pred taper.  Admitted January 17 through March 04, 2011 for COPD, exacerbation. She was treated with IV antibiotics, steroids, and nebulizer treatments. She was started on Pulmicort inhaler. At discharge along with antibiotic, and steroids. She is currently on 10 mg of prednisone. And is taking Levaquin.  The patient is not clear what medication she is supposed to be on. Also,son  helps her at home, but he works full-time and is also unclear what medication she is on. We contacted the pharmacy, however, was unable to determine to clarify her meds.  She appears very cachectic and continues to have weight loss. Son says that she does not eat very well. We talked about ensure supplementation. Also, discussed that patient may not be safe at home by herself and suggested consideration of nursing home placement. However, they declined at this time. >rx budesonide and Perforomist. Continued on pred at 20 mg   03/15/2011 Follow up (son with her today ) Pt returns for follow up . She has finished  all her abx. Currently on Prenisone 10mg .  Does not feel that good. It appears her and her son are confused about her meds .  She was suppose to hold on prednisone at 20mg  daily . She did not start on perforomist.  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  Followed for COPD, complicated by chronic sinusitis, rhinitis FOLLOWS FOR: patient states breathing is better, states has not had to use oxygen x 4 days. c/o chest pain . Denies cough, sob, chest tightness, and wheezing  After hard winter she feels much better now. Burning in stomach complaint last visit has resolved. She feels fine without Spiriva. Gaining some weight and pleased with that. Able to walk her dog some now.  ROS-see HPI Constitutional:   No-   weight loss, night sweats, fevers, chills, fatigue, lassitude. HEENT:   No-  headaches, difficulty swallowing, tooth/dental problems, sore throat,       No-  sneezing, itching, ear ache, nasal congestion, post nasal drip,  CV:  No-   chest pain, orthopnea, PND, swelling in lower extremities, anasarca, dizziness, palpitations Resp: No- acute shortness of breath with exertion or at rest.              No-   productive cough,  No non-productive cough,  No- coughing up of blood.              No-   change in color of mucus.  No- wheezing.   Skin: No-   rash or lesions. GI:  No-   heartburn, indigestion, abdominal pain, nausea, vomiting, diarrhea,                 change in bowel habits, loss of appetite GU: MS:  No-   joint pain or swelling.  No- decreased range of motion.  No- back pain. Neuro-     nothing unusual Psych:  No- change in mood or affect. No depression or anxiety.  No memory loss.   OBJ- Physical Exam General- Alert, Oriented, Affect-appropriate, Distress- none acute, very thin, smiling Skin- rash-none, lesions- none, excoriation- none Lymphadenopathy- none Head- atraumatic            Eyes- Gross vision intact, PERRLA, conjunctivae and secretions clear            Ears- Hearing, canals-normal            Nose- Clear, no-Septal dev, mucus, polyps, erosion, perforation             Throat- Mallampati II , mucosa clear , drainage- none,  tonsils- atrophic Neck- flexible , trachea midline, no stridor , thyroid nl, carotid no bruit Chest - symmetrical excursion , unlabored           Heart/CV- RRR , no murmur , no gallop  , no rub, nl s1 s2                           - JVD- none , edema- none, stasis changes- none, varices- none           Lung- clear to P&A/ distant, wheeze- none, cough- none , dullness-none, rub- none           Chest wall-  Abd- Br/ Gen/ Rectal- Not done, not indicated Extrem- cyanosis- none, clubbing, none, atrophy- none, strength- nl Neuro- grossly intact to observation

## 2011-03-29 NOTE — Patient Instructions (Signed)
For your home nebulizer- you have Xopenex/ levalbuterol, and Pulmicort/ budesonide  You also have a long acting nebulizer med- Perforomist. You can substitute this for Xopenex if you need to, for a longer lasting treatment. Don't combine Xopenex with Perforomist, or take them on top of each other because they would cause too much stimulation.

## 2011-04-02 ENCOUNTER — Encounter: Payer: Self-pay | Admitting: Internal Medicine

## 2011-04-02 NOTE — Assessment & Plan Note (Addendum)
Much improved recently. Hopefully this will be a stable baseline for her as we get away from the cold and flu season. Brief reviewed her respiratory medications again today. She was beginning to get mixed up.

## 2011-04-02 NOTE — Assessment & Plan Note (Signed)
Saturating adequately today on room air. She has oxygen available and we will watch for stability

## 2011-04-18 ENCOUNTER — Encounter: Payer: Self-pay | Admitting: Internal Medicine

## 2011-04-18 ENCOUNTER — Ambulatory Visit (INDEPENDENT_AMBULATORY_CARE_PROVIDER_SITE_OTHER): Payer: Medicare Other | Admitting: Internal Medicine

## 2011-04-18 DIAGNOSIS — I10 Essential (primary) hypertension: Secondary | ICD-10-CM

## 2011-04-18 DIAGNOSIS — I498 Other specified cardiac arrhythmias: Secondary | ICD-10-CM

## 2011-04-18 DIAGNOSIS — I471 Supraventricular tachycardia: Secondary | ICD-10-CM

## 2011-04-18 DIAGNOSIS — R55 Syncope and collapse: Secondary | ICD-10-CM | POA: Diagnosis not present

## 2011-04-18 LAB — PACEMAKER DEVICE OBSERVATION: DEVICE MODEL PM: 2341959

## 2011-04-18 NOTE — Patient Instructions (Addendum)
Your physician wants you to follow-up in: 3 months with device clinic and 6 months with Dr Stevan Born will receive a reminder letter in the mail two months in advance. If you don't receive a letter, please call our office to schedule the follow-up appointment.   Your physician recommends that you return for lab work today Dig [facility]

## 2011-04-18 NOTE — Assessment & Plan Note (Signed)
She has had no recurrent syncopal episodes since her hospitalization. She will undergo watchful waiting. We will recheck her implantable loop recorder in several months.

## 2011-04-18 NOTE — Assessment & Plan Note (Signed)
Her blood pressure today is well controlled. She will maintain a low-sodium diet and her current medical therapy.
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
## 2011-04-19 ENCOUNTER — Telehealth: Payer: Self-pay | Admitting: Internal Medicine

## 2011-04-19 NOTE — Telephone Encounter (Signed)
CDY, before calling the pharmacy back, I want to verify with you that the pt should not be taking albuterol in her nebulizer. Looks like she is on perforomist, xopenex, and budesonide for her neb meds. Please advise thanks!

## 2011-04-19 NOTE — Telephone Encounter (Signed)
Does not need both albuterol and xopenex. These are duplicate short acting bronchodilators.

## 2011-04-19 NOTE — Telephone Encounter (Signed)
LMOM TCB x1 for [name].  Pt should only be on brovana, perforomist, and xopenex in her neb.    [name] returned call while telephone note was open.  Advised pt is to not be taking the albuterol in her neb.  [name] verbalized her understanding.  Nothing further needed, will sign off.

## 2011-04-24 ENCOUNTER — Other Ambulatory Visit: Payer: Self-pay | Admitting: Internal Medicine

## 2011-04-24 DIAGNOSIS — L299 Pruritus, unspecified: Secondary | ICD-10-CM | POA: Diagnosis not present

## 2011-04-24 DIAGNOSIS — Z79899 Other long term (current) drug therapy: Secondary | ICD-10-CM | POA: Diagnosis not present

## 2011-04-24 DIAGNOSIS — R238 Other skin changes: Secondary | ICD-10-CM | POA: Diagnosis not present

## 2011-04-26 ENCOUNTER — Other Ambulatory Visit: Payer: Self-pay | Admitting: Dermatology

## 2011-04-26 DIAGNOSIS — L270 Generalized skin eruption due to drugs and medicaments taken internally: Secondary | ICD-10-CM | POA: Diagnosis not present

## 2011-04-26 DIAGNOSIS — I776 Arteritis, unspecified: Secondary | ICD-10-CM | POA: Diagnosis not present

## 2011-04-26 DIAGNOSIS — L988 Other specified disorders of the skin and subcutaneous tissue: Secondary | ICD-10-CM | POA: Diagnosis not present

## 2011-05-09 DIAGNOSIS — E119 Type 2 diabetes mellitus without complications: Secondary | ICD-10-CM | POA: Diagnosis not present

## 2011-05-09 DIAGNOSIS — E785 Hyperlipidemia, unspecified: Secondary | ICD-10-CM | POA: Diagnosis not present

## 2011-05-09 DIAGNOSIS — E559 Vitamin D deficiency, unspecified: Secondary | ICD-10-CM | POA: Diagnosis not present

## 2011-05-11 DIAGNOSIS — E785 Hyperlipidemia, unspecified: Secondary | ICD-10-CM | POA: Diagnosis not present

## 2011-05-11 DIAGNOSIS — E559 Vitamin D deficiency, unspecified: Secondary | ICD-10-CM | POA: Diagnosis not present

## 2011-05-11 DIAGNOSIS — E119 Type 2 diabetes mellitus without complications: Secondary | ICD-10-CM | POA: Diagnosis not present

## 2011-05-11 DIAGNOSIS — I10 Essential (primary) hypertension: Secondary | ICD-10-CM | POA: Diagnosis not present

## 2011-05-15 ENCOUNTER — Other Ambulatory Visit: Payer: Self-pay | Admitting: Internal Medicine

## 2011-05-16 NOTE — Telephone Encounter (Signed)
CY,please advise if okay to refill; looks like Rx was removed from medication list. Thanks.

## 2011-05-18 ENCOUNTER — Telehealth: Payer: Self-pay | Admitting: Internal Medicine

## 2011-05-18 MED ORDER — TIOTROPIUM BROMIDE MONOHYDRATE 18 MCG IN CAPS: 18.0000 ug | ORAL_CAPSULE | Freq: Every day | RESPIRATORY_TRACT | Status: DC

## 2011-05-18 NOTE — Telephone Encounter (Signed)
Ok to refill 

## 2011-05-18 NOTE — Telephone Encounter (Signed)
Refill for spiriva was sent to pharm. Pt aware.

## 2011-05-23 ENCOUNTER — Telehealth: Payer: Self-pay | Admitting: Internal Medicine

## 2011-05-23 NOTE — Telephone Encounter (Signed)
Received 7 pages from [employer]. Sent to Dr. Maple Hudson. SD 05/23/11.

## 2011-06-27 ENCOUNTER — Ambulatory Visit (INDEPENDENT_AMBULATORY_CARE_PROVIDER_SITE_OTHER): Payer: Medicare Other | Admitting: Internal Medicine

## 2011-06-27 ENCOUNTER — Encounter: Payer: Self-pay | Admitting: Internal Medicine

## 2011-06-27 VITALS — BP 124/78 | HR 68 | Ht 62.0 in | Wt 94.6 lb

## 2011-06-27 DIAGNOSIS — I10 Essential (primary) hypertension: Secondary | ICD-10-CM | POA: Diagnosis not present

## 2011-06-27 DIAGNOSIS — J9610 Chronic respiratory failure, unspecified whether with hypoxia or hypercapnia: Secondary | ICD-10-CM | POA: Diagnosis not present

## 2011-06-27 DIAGNOSIS — J309 Allergic rhinitis, unspecified: Secondary | ICD-10-CM | POA: Diagnosis not present

## 2011-06-27 DIAGNOSIS — J449 Chronic obstructive pulmonary disease, unspecified: Secondary | ICD-10-CM

## 2011-06-27 DIAGNOSIS — J329 Chronic sinusitis, unspecified: Secondary | ICD-10-CM

## 2011-06-27 DIAGNOSIS — R911 Solitary pulmonary nodule: Secondary | ICD-10-CM

## 2011-06-27 MED ORDER — PHENYLEPHRINE HCL 1 % NA SOLN
3.0000 [drp] | Freq: Once | NASAL | Status: AC
  Administered 2011-06-27: 3 [drp] via NASAL

## 2011-06-27 NOTE — Patient Instructions (Signed)
Neb neo nasal  Take your prednisone - enough to take 20 mg daily, for 5 days, then stop.  Sample Dymista nasal spray    2 sprays each nostril, every night at bedtime  When you come back we will arrange a follow up CT scan to check on the possible slow infection in your lungs  [facility]

## 2011-06-27 NOTE — Progress Notes (Signed)
Patient ID: [name], female    DOB: 1931/08/20, 78 y.o.   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  Followed for COPD, complicated by chronic sinusitis, rhinitis. Last here April 12, 2010 after hosp for COPD exacerbation. Older sister was chain smoker who died of emphysema.  Now here for post hospital f/u after hosp 5/28-29/12 for exacerb asthma/ COPD. She hurried home early to care for debilitated husband. She blames this admission on trial of new "Advanced Aspirin" taken for joint pain. No prior prob with aspirin, but usually has used tylenol.  Today feels well, a little bit tight. Now tapering prednisone and no concerns with current meds.   08/11/10- Breathing is fair- aware of some shortness, of breath and using her rescue inhaler 0-3/day. Stuffy nose- denies need for treatment.  Incidental shingles across left breast- Taking a pill twice daily. No fever. Denies chest infection. Husband- Hospice for prostate cancer.  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  Followed for COPD, complicated by chronic sinusitis, rhinitis. Husband died and she admits being depressed, having difficulty coping. Hasn't felt well for 10 days with increased cough. Sputum is stained orange by cough syrup but otherwise not purulent. Denies fever chest pain, blood or swollen glands. Using rescue inhaler frequently. Went to the emergency room one month ago for shortness of breath.  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  Followed for COPD, complicated by chronic sinusitis, rhinitis Has had flu shot. Hospitalized several times, once for SVT, possibly aggravated by her nebulizer use. Latest was for COPD exacerbation. Hospital notes reviewed. Ventilation perfusion lung scan on November 19 was negative for PE but showed air trapping consistent with COPD. CT scan of  head done 12/23/2010 showed pan sinusitis with an air-fluid level in the left maxillary sinus. She complains of frontal headache and chronic nasal congestion. Chest feels comfortable but she admits that he persistent productive cough with white to yellow sputum. No blood. She's not sure about fever. She has been using her home nebulizer only occasionally because she thinks the albuterol solution burns her mouth.  03/08/2011 Acute work in (son with her today  )  Pt presents for an acute ov. Complains of breathing no better since hosp discharge > still having increased SOB, prod cough with yellow mucus, tightness/pressure in chest  - still taking levaquin and pred taper.  Admitted January 17 through March 04, 2011 for COPD, exacerbation. She was treated with IV antibiotics, steroids, and nebulizer treatments. She was started on Pulmicort inhaler. At discharge along with antibiotic, and steroids. She is currently on 10 mg of prednisone. And is taking Levaquin.  The patient is not clear what medication she is supposed to be on. Also,son  helps her at home, but he works full-time and is also unclear what medication she is on. We contacted the pharmacy, however, was unable to determine to clarify her meds.  She appears very cachectic and continues to have weight loss. Son says that she does not eat very well. We talked about ensure supplementation. Also, discussed that patient may not be safe at home by herself and suggested consideration of nursing home placement. However, they declined at this time. >rx budesonide and Perforomist. Continued on pred at 20 mg   03/15/2011 Follow up (son with her today ) Pt returns for follow up . She has finished  all her abx. Currently on Prenisone 10mg .  Does not feel that good. It appears her and her son are confused about her meds .  She was suppose to hold on prednisone at 20mg  daily . She did not start on perforomist.  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  Followed for COPD, complicated by chronic sinusitis, rhinitis FOLLOWS FOR: patient states breathing is better, states has not had to use oxygen x 4 days. c/o chest pain . Denies cough, sob, chest tightness, and wheezing  After hard winter she feels much better now. Burning in stomach complaint last visit has resolved. She feels fine without Spiriva. Gaining some weight and pleased with that. Able to walk her dog some now.  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  Followed for COPD, complicated by chronic sinusitis, rhinitis. Nasal congestion persistent x2 months,unable to breathe through nose at all; cough-non productive Much cough, dry. Occasional wheeze relieved by rescue inhaler. No fever or sweat, chest pain or palpitation. CT chest- 03/05/11- images reviewed w/ her: He IMPRESSION:  1. The plain film abnormality was secondary to a nonacute/healing  posterior right ninth rib fracture.  2. No suspicious pulmonary nodule.  3. Scattered areas of new or progressive peribronchovascular micro  nodularity and bronchial wall thickening. Suspect infection,  including atypical etiologies such as Mycobacterium avium  intracellular. This is of indeterminate acuity.  4. Mild underlying centrilobular emphysema.  5. Possible sub centimeter low density liver lesion. Nonspecific.  If there is a history of primary malignancy or a concern of liver  disease, dedicated outpatient contrast enhanced MRI should be  considered.  Original Report Authenticated By: Consuello Bossier, M.D.   ROS-see HPI Constitutional:   No-   weight loss, night sweats, fevers, chills, fatigue, lassitude. HEENT:   No-  headaches, difficulty swallowing, tooth/dental problems, sore throat,       No-  sneezing, itching, ear ache,  + nasal congestion, post nasal drip,  CV:  No-   chest pain, orthopnea,  PND, swelling in lower extremities, anasarca, dizziness, palpitations Resp: No- acute shortness of breath with exertion or at rest.              No-   productive cough,  + non-productive cough,  No- coughing up of blood.              No-   change in color of mucus.  No- wheezing.   Skin: No-   rash or lesions. GI:  No-   heartburn, indigestion, abdominal pain, nausea, vomiting,  GU: MS:  No-   joint pain or swelling.  Neuro-     nothing unusual Psych:  No- change in mood or affect. No depression or anxiety.  No memory loss.  OBJ- Physical Exam General- Alert, Oriented, Affect-appropriate, Distress- none acute, very thin, smiling Skin- rash-none, lesions- none, excoriation- none Lymphadenopathy- none Head- atraumatic            Eyes- Gross vision intact, PERRLA, conjunctivae and secretions clear            Ears- Hearing, canals-normal            Nose- marked turbinate edema, pale, no-Septal dev, mucus, polyps, erosion, perforation             Throat- Mallampati II , mucosa clear , drainage- none, tonsils- atrophic Neck-  flexible , trachea midline, no stridor , thyroid nl, carotid no bruit Chest - symmetrical excursion , unlabored           Heart/CV- RRR , no murmur , no gallop  , no rub, nl s1 s2                           - JVD- none , edema- none, stasis changes- none, varices- none           Lung- clear to P&A/ distant, wheeze- none, cough- none , dullness-none, rub- none           Chest wall- loop recorder left anterior chest wall Abd- Br/ Gen/ Rectal- Not done, not indicated Extrem- cyanosis- none, clubbing, none, atrophy- none, strength- nl Neuro- grossly intact to observation

## 2011-07-02 NOTE — Assessment & Plan Note (Signed)
I don't see polyps on anterior exam. At this time of year, and allergic rhinitis is most likely although she is elderly to be having some much trouble. Plan-nasal nebulizer decongestant, prednisone 20 mg daily x5 days, sample Dymista nasal spray

## 2011-07-02 NOTE — Assessment & Plan Note (Signed)
Good control, emphysema pattern.

## 2011-07-02 NOTE — Assessment & Plan Note (Signed)
Oxygen status appears stable.

## 2011-07-02 NOTE — Assessment & Plan Note (Signed)
She denies night sweats or productive cough. She would be a good candidate for atypical AFB bronchiolitis. We'll watch for progression, planning repeat CT scan in 6 months/July 2013.

## 2011-07-03 ENCOUNTER — Other Ambulatory Visit: Payer: Self-pay | Admitting: Dermatology

## 2011-07-03 ENCOUNTER — Telehealth: Payer: Self-pay | Admitting: Internal Medicine

## 2011-07-03 DIAGNOSIS — L578 Other skin changes due to chronic exposure to nonionizing radiation: Secondary | ICD-10-CM | POA: Diagnosis not present

## 2011-07-03 DIAGNOSIS — L820 Inflamed seborrheic keratosis: Secondary | ICD-10-CM | POA: Diagnosis not present

## 2011-07-03 DIAGNOSIS — D239 Other benign neoplasm of skin, unspecified: Secondary | ICD-10-CM | POA: Diagnosis not present

## 2011-07-03 NOTE — Telephone Encounter (Signed)
Pt may have skin cancer

## 2011-07-03 NOTE — Telephone Encounter (Signed)
Pt has had biopsy for skin cancer and they want to know if they need to do more they want to make sure it is ok since she has loop recorder

## 2011-07-05 NOTE — Telephone Encounter (Signed)
Tried to call in regards to message but was disconnected/kwb

## 2011-07-06 NOTE — Telephone Encounter (Signed)
lmovm for nurse to call back.

## 2011-07-07 NOTE — Telephone Encounter (Signed)
Spoke with dermatology office, no other treatment necessary for the patient.

## 2011-07-13 ENCOUNTER — Other Ambulatory Visit: Payer: Self-pay | Admitting: Internal Medicine

## 2011-07-19 ENCOUNTER — Ambulatory Visit (INDEPENDENT_AMBULATORY_CARE_PROVIDER_SITE_OTHER): Payer: Medicare Other | Admitting: *Deleted

## 2011-07-19 ENCOUNTER — Encounter: Payer: Self-pay | Admitting: Internal Medicine

## 2011-07-19 DIAGNOSIS — R55 Syncope and collapse: Secondary | ICD-10-CM

## 2011-07-19 LAB — PACEMAKER DEVICE OBSERVATION

## 2011-07-19 NOTE — Patient Instructions (Signed)
See Dr. Ladona Ridgel in 3 months, we will schedule you today.    Check your medications when you get home and verify the list is correct.

## 2011-08-16 DIAGNOSIS — E785 Hyperlipidemia, unspecified: Secondary | ICD-10-CM | POA: Diagnosis not present

## 2011-08-16 DIAGNOSIS — E119 Type 2 diabetes mellitus without complications: Secondary | ICD-10-CM | POA: Diagnosis not present

## 2011-08-16 DIAGNOSIS — E559 Vitamin D deficiency, unspecified: Secondary | ICD-10-CM | POA: Diagnosis not present

## 2011-08-21 DIAGNOSIS — I10 Essential (primary) hypertension: Secondary | ICD-10-CM | POA: Diagnosis not present

## 2011-08-21 DIAGNOSIS — E785 Hyperlipidemia, unspecified: Secondary | ICD-10-CM | POA: Diagnosis not present

## 2011-08-21 DIAGNOSIS — E559 Vitamin D deficiency, unspecified: Secondary | ICD-10-CM | POA: Diagnosis not present

## 2011-08-21 DIAGNOSIS — E119 Type 2 diabetes mellitus without complications: Secondary | ICD-10-CM | POA: Diagnosis not present

## 2011-08-28 ENCOUNTER — Encounter (HOSPITAL_COMMUNITY): Payer: Self-pay | Admitting: Emergency Medicine

## 2011-08-28 ENCOUNTER — Emergency Department (HOSPITAL_COMMUNITY): Payer: Medicare Other

## 2011-08-28 ENCOUNTER — Inpatient Hospital Stay (HOSPITAL_COMMUNITY)
Admission: EM | Admit: 2011-08-28 | Discharge: 2011-09-08 | DRG: 336 | Disposition: A | Discharge status: Home / Self Care | Payer: Medicare Other | Attending: Internal Medicine | Admitting: Internal Medicine

## 2011-08-28 DIAGNOSIS — K56609 Unspecified intestinal obstruction, unspecified as to partial versus complete obstruction: Secondary | ICD-10-CM | POA: Diagnosis not present

## 2011-08-28 DIAGNOSIS — K565 Intestinal adhesions [bands], unspecified as to partial versus complete obstruction: Secondary | ICD-10-CM | POA: Diagnosis not present

## 2011-08-28 DIAGNOSIS — R112 Nausea with vomiting, unspecified: Secondary | ICD-10-CM | POA: Diagnosis present

## 2011-08-28 DIAGNOSIS — N179 Acute kidney failure, unspecified: Secondary | ICD-10-CM | POA: Diagnosis not present

## 2011-08-28 DIAGNOSIS — R109 Unspecified abdominal pain: Secondary | ICD-10-CM

## 2011-08-28 DIAGNOSIS — R3989 Other symptoms and signs involving the genitourinary system: Secondary | ICD-10-CM

## 2011-08-28 DIAGNOSIS — I10 Essential (primary) hypertension: Secondary | ICD-10-CM | POA: Diagnosis not present

## 2011-08-28 DIAGNOSIS — R079 Chest pain, unspecified: Secondary | ICD-10-CM

## 2011-08-28 DIAGNOSIS — R0602 Shortness of breath: Secondary | ICD-10-CM | POA: Diagnosis not present

## 2011-08-28 DIAGNOSIS — E871 Hypo-osmolality and hyponatremia: Secondary | ICD-10-CM | POA: Diagnosis present

## 2011-08-28 DIAGNOSIS — I4891 Unspecified atrial fibrillation: Secondary | ICD-10-CM | POA: Diagnosis not present

## 2011-08-28 DIAGNOSIS — K6389 Other specified diseases of intestine: Secondary | ICD-10-CM | POA: Diagnosis not present

## 2011-08-28 DIAGNOSIS — I959 Hypotension, unspecified: Secondary | ICD-10-CM | POA: Diagnosis not present

## 2011-08-28 DIAGNOSIS — R410 Disorientation, unspecified: Secondary | ICD-10-CM

## 2011-08-28 DIAGNOSIS — K5289 Other specified noninfective gastroenteritis and colitis: Secondary | ICD-10-CM | POA: Diagnosis not present

## 2011-08-28 DIAGNOSIS — F411 Generalized anxiety disorder: Secondary | ICD-10-CM | POA: Diagnosis not present

## 2011-08-28 DIAGNOSIS — R55 Syncope and collapse: Secondary | ICD-10-CM

## 2011-08-28 DIAGNOSIS — Z09 Encounter for follow-up examination after completed treatment for conditions other than malignant neoplasm: Secondary | ICD-10-CM | POA: Diagnosis not present

## 2011-08-28 DIAGNOSIS — E119 Type 2 diabetes mellitus without complications: Secondary | ICD-10-CM | POA: Diagnosis present

## 2011-08-28 DIAGNOSIS — F419 Anxiety disorder, unspecified: Secondary | ICD-10-CM

## 2011-08-28 DIAGNOSIS — J4489 Other specified chronic obstructive pulmonary disease: Secondary | ICD-10-CM | POA: Diagnosis not present

## 2011-08-28 DIAGNOSIS — J329 Chronic sinusitis, unspecified: Secondary | ICD-10-CM

## 2011-08-28 DIAGNOSIS — J309 Allergic rhinitis, unspecified: Secondary | ICD-10-CM

## 2011-08-28 DIAGNOSIS — K5669 Other intestinal obstruction: Secondary | ICD-10-CM | POA: Diagnosis not present

## 2011-08-28 DIAGNOSIS — J441 Chronic obstructive pulmonary disease with (acute) exacerbation: Secondary | ICD-10-CM | POA: Diagnosis not present

## 2011-08-28 DIAGNOSIS — I519 Heart disease, unspecified: Secondary | ICD-10-CM | POA: Diagnosis present

## 2011-08-28 DIAGNOSIS — R110 Nausea: Secondary | ICD-10-CM | POA: Diagnosis not present

## 2011-08-28 DIAGNOSIS — J449 Chronic obstructive pulmonary disease, unspecified: Secondary | ICD-10-CM | POA: Diagnosis present

## 2011-08-28 DIAGNOSIS — E860 Dehydration: Secondary | ICD-10-CM | POA: Diagnosis not present

## 2011-08-28 DIAGNOSIS — R1110 Vomiting, unspecified: Secondary | ICD-10-CM

## 2011-08-28 DIAGNOSIS — K929 Disease of digestive system, unspecified: Secondary | ICD-10-CM | POA: Diagnosis not present

## 2011-08-28 DIAGNOSIS — R911 Solitary pulmonary nodule: Secondary | ICD-10-CM

## 2011-08-28 DIAGNOSIS — K560 Paralytic ileus: Secondary | ICD-10-CM | POA: Diagnosis not present

## 2011-08-28 DIAGNOSIS — M792 Neuralgia and neuritis, unspecified: Secondary | ICD-10-CM

## 2011-08-28 DIAGNOSIS — I471 Supraventricular tachycardia: Secondary | ICD-10-CM

## 2011-08-28 LAB — CBC WITH DIFFERENTIAL/PLATELET
Basophils Relative: 0 % (ref 0–1)
Eosinophils Relative: 0 % (ref 0–5)
HCT: 41.2 % (ref 36.0–46.0)
Hemoglobin: 14.3 g/dL (ref 12.0–15.0)
Lymphocytes Relative: 14 % (ref 12–46)
MCHC: 34.7 g/dL (ref 30.0–36.0)
MCV: 91.8 fL (ref 78.0–100.0)
Monocytes Absolute: 0.4 10*3/uL (ref 0.1–1.0)
Monocytes Relative: 5 % (ref 3–12)
Neutro Abs: 5.4 10*3/uL (ref 1.7–7.7)
RDW: 14.3 % (ref 11.5–15.5)

## 2011-08-28 LAB — COMPREHENSIVE METABOLIC PANEL
BUN: 25 mg/dL — ABNORMAL HIGH (ref 6–23)
CO2: 24 mEq/L (ref 19–32)
Calcium: 10 mg/dL (ref 8.4–10.5)
Chloride: 91 mEq/L — ABNORMAL LOW (ref 96–112)
Creatinine, Ser: 1.15 mg/dL — ABNORMAL HIGH (ref 0.50–1.10)
GFR calc non Af Amer: 44 mL/min — ABNORMAL LOW (ref >90)
Total Bilirubin: 0.6 mg/dL (ref 0.3–1.2)

## 2011-08-28 LAB — GLUCOSE, CAPILLARY: Glucose-Capillary: 132 mg/dL — ABNORMAL HIGH (ref 70–99)

## 2011-08-28 MED ORDER — METOPROLOL TARTRATE 1 MG/ML IV SOLN
5.0000 mg | Freq: Three times a day (TID) | INTRAVENOUS | Status: DC | PRN
  Filled 2011-08-28: qty 5

## 2011-08-28 MED ORDER — HYDROMORPHONE HCL PF 1 MG/ML IJ SOLN: 0.5000 mg | Freq: Once | INTRAMUSCULAR | Status: DC

## 2011-08-28 MED ORDER — TIOTROPIUM BROMIDE MONOHYDRATE 18 MCG IN CAPS
18.0000 ug | ORAL_CAPSULE | Freq: Every day | RESPIRATORY_TRACT | Status: DC
  Administered 2011-08-29 – 2011-09-04 (×6): 18 ug via RESPIRATORY_TRACT
  Filled 2011-08-28 (×2): qty 5

## 2011-08-28 MED ORDER — SODIUM CHLORIDE 0.9 % IV SOLN
INTRAVENOUS | Status: AC
  Administered 2011-08-28: 17:00:00 via INTRAVENOUS

## 2011-08-28 MED ORDER — MORPHINE SULFATE 2 MG/ML IJ SOLN
2.0000 mg | INTRAMUSCULAR | Status: DC | PRN
  Administered 2011-08-28 – 2011-08-29 (×2): 2 mg via INTRAVENOUS
  Filled 2011-08-28 (×2): qty 1

## 2011-08-28 MED ORDER — ONDANSETRON HCL 4 MG/2ML IJ SOLN
4.0000 mg | INTRAMUSCULAR | Status: DC | PRN
  Administered 2011-08-28: 4 mg via INTRAVENOUS
  Filled 2011-08-28: qty 2

## 2011-08-28 MED ORDER — MORPHINE SULFATE 4 MG/ML IJ SOLN: 4.0000 mg | Freq: Once | INTRAMUSCULAR | Status: DC

## 2011-08-28 MED ORDER — INSULIN ASPART 100 UNIT/ML ~~LOC~~ SOLN: 0.0000 [IU] | Freq: Three times a day (TID) | SUBCUTANEOUS | Status: DC

## 2011-08-28 MED ORDER — SODIUM CHLORIDE 0.9 % IJ SOLN
3.0000 mL | Freq: Two times a day (BID) | INTRAMUSCULAR | Status: DC
Start: 2011-08-28 — End: 2011-08-30
  Administered 2011-08-28 – 2011-08-29 (×2): 3 mL via INTRAVENOUS

## 2011-08-28 MED ORDER — ONDANSETRON HCL 4 MG/2ML IJ SOLN
4.0000 mg | Freq: Four times a day (QID) | INTRAMUSCULAR | Status: DC | PRN
  Administered 2011-08-31 – 2011-09-04 (×5): 4 mg via INTRAVENOUS
  Filled 2011-08-28 (×6): qty 2

## 2011-08-28 MED ORDER — DIPHENHYDRAMINE HCL 50 MG/ML IJ SOLN
25.0000 mg | Freq: Four times a day (QID) | INTRAMUSCULAR | Status: DC | PRN
  Administered 2011-08-29: 25 mg via INTRAVENOUS
  Filled 2011-08-28: qty 1

## 2011-08-28 MED ORDER — INSULIN ASPART 100 UNIT/ML ~~LOC~~ SOLN: 0.0000 [IU] | Freq: Every day | SUBCUTANEOUS | Status: DC

## 2011-08-28 MED ORDER — ONDANSETRON HCL 4 MG PO TABS: 4.0000 mg | ORAL_TABLET | Freq: Four times a day (QID) | ORAL | Status: DC | PRN

## 2011-08-28 MED ORDER — DIGOXIN 0.25 MG/ML IJ SOLN
0.1000 mg | Freq: Every day | INTRAMUSCULAR | Status: DC
  Filled 2011-08-28: qty 0.5

## 2011-08-28 MED ORDER — POTASSIUM CHLORIDE IN NACL 20-0.9 MEQ/L-% IV SOLN
INTRAVENOUS | Status: DC
  Administered 2011-08-28: 20:00:00 via INTRAVENOUS
  Filled 2011-08-28 (×3): qty 1000

## 2011-08-28 MED ORDER — HYDROMORPHONE HCL PF 1 MG/ML IJ SOLN
0.5000 mg | Freq: Once | INTRAMUSCULAR | Status: AC
  Administered 2011-08-28: 0.5 mg via INTRAVENOUS
  Filled 2011-08-28: qty 1

## 2011-08-28 MED ORDER — LEVALBUTEROL HCL 0.63 MG/3ML IN NEBU
0.6300 mg | INHALATION_SOLUTION | Freq: Four times a day (QID) | RESPIRATORY_TRACT | Status: DC | PRN
  Filled 2011-08-28: qty 3

## 2011-08-28 MED ORDER — DIGOXIN 0.25 MG/ML IJ SOLN
0.1250 mg | Freq: Every day | INTRAMUSCULAR | Status: DC
  Administered 2011-08-28 – 2011-09-07 (×11): 0.125 mg via INTRAVENOUS
  Filled 2011-08-28 (×16): qty 0.5

## 2011-08-28 MED ORDER — SODIUM CHLORIDE 0.9 % IV BOLUS (SEPSIS)
1000.0000 mL | Freq: Once | INTRAVENOUS | Status: AC
  Administered 2011-08-28: 1000 mL via INTRAVENOUS

## 2011-08-28 MED ORDER — INSULIN ASPART 100 UNIT/ML ~~LOC~~ SOLN: 0.0000 [IU] | SUBCUTANEOUS | Status: DC

## 2011-08-28 NOTE — ED Notes (Signed)
Please call Gerlene Burdock if needed, cell [phone], 7p-7a call [phone] ( call multiple times until answer) , Theron Arista [phone]
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
## 2011-08-28 NOTE — ED Provider Notes (Signed)
Medical screening examination/treatment/procedure(s) were conducted as a shared visit with non-physician practitioner(s) and myself.  I personally evaluated the patient during the encounter Has hx of hysterectomy.  C/o no bm for 3 d. + bilious emesis.  Pe. + abd ttp with no distention.  Ct + sbo.  Will place ng and admit.  Cheri Guppy, MD 08/28/11 1504

## 2011-08-28 NOTE — ED Provider Notes (Signed)
Medical screening examination/treatment/procedure(s) were conducted as a shared visit with non-physician practitioner(s) and myself.  I personally evaluated the patient during the encounter  Rayvion Stumph, MD 08/28/11 1524 

## 2011-08-28 NOTE — Progress Notes (Signed)
[name], is a 78 y.o. female,   MRN: [id number]  -  DOB - October 08, 1931  Outpatient Primary MD for the patient is FULP, CAMMIE, MD  in for    Chief Complaint  Patient presents with  . Abdominal Pain  . Nausea     Blood pressure 99/67, pulse 71, temperature 97.4 F (36.3 C), temperature source Oral, resp. rate 18, SpO2 99.00%.  Principal Problem:  *SBO (small bowel obstruction) Active Problems:  COPD  Chronic respiratory failure  HTN (hypertension)  LV dysfunction  Hyponatremia  Anxiety  A-fib  Acute renal insufficiency  Very pleasant 78 yo hx copd, HTN, LV dysfunction, afib, DM, presents to ED with cc of abdominal pain as well as nausea and 1 episode vomiting. No BM since 7/12, reportedly not passing gas. Work up in ED yields abdominal xray with  air filled mildly distended small bowel loops with air-fluid levels  on upright view new versus previous exam question small bowel obstruction, hyponatremia, acute renal insuff.

## 2011-08-28 NOTE — ED Notes (Signed)
Pt c/o mid abd pain with nausea x 3 days; pt sts unable to drink due to nausea; pt sts last BM was Friday; pt sts abd feels bloated

## 2011-08-28 NOTE — ED Notes (Signed)
First contact with pt. Pt with NG in place to lws. Denies pain at this time.

## 2011-08-28 NOTE — ED Notes (Signed)
Report called pt ready for transport to floor

## 2011-08-29 ENCOUNTER — Inpatient Hospital Stay (HOSPITAL_COMMUNITY): Payer: Medicare Other

## 2011-08-29 DIAGNOSIS — E119 Type 2 diabetes mellitus without complications: Secondary | ICD-10-CM

## 2011-08-29 LAB — CBC WITH DIFFERENTIAL/PLATELET
Basophils Absolute: 0 10*3/uL (ref 0.0–0.1)
Basophils Relative: 1 % (ref 0–1)
Basophils Relative: 1 % (ref 0–1)
Eosinophils Absolute: 0 10*3/uL (ref 0.0–0.7)
Eosinophils Absolute: 0 10*3/uL (ref 0.0–0.7)
Eosinophils Relative: 0 % (ref 0–5)
Hemoglobin: 12.4 g/dL (ref 12.0–15.0)
Lymphs Abs: 1 10*3/uL (ref 0.7–4.0)
MCH: 31.2 pg (ref 26.0–34.0)
MCH: 31.3 pg (ref 26.0–34.0)
MCHC: 33.4 g/dL (ref 30.0–36.0)
MCHC: 33.4 g/dL (ref 30.0–36.0)
MCV: 93.2 fL (ref 78.0–100.0)
Monocytes Relative: 11 % (ref 3–12)
Monocytes Relative: 12 % (ref 3–12)
Neutro Abs: 2.1 10*3/uL (ref 1.7–7.7)
Neutrophils Relative %: 60 % (ref 43–77)
Neutrophils Relative %: 60 % (ref 43–77)
Platelets: 182 10*3/uL (ref 150–400)
RDW: 14.7 % (ref 11.5–15.5)

## 2011-08-29 LAB — URINALYSIS, ROUTINE W REFLEX MICROSCOPIC
Nitrite: NEGATIVE
Protein, ur: 30 mg/dL — AB
Specific Gravity, Urine: 1.022 (ref 1.005–1.030)
Urobilinogen, UA: 0.2 mg/dL (ref 0.0–1.0)

## 2011-08-29 LAB — COMPREHENSIVE METABOLIC PANEL
ALT: 9 U/L (ref 0–35)
AST: 15 U/L (ref 0–37)
Albumin: 3 g/dL — ABNORMAL LOW (ref 3.5–5.2)
CO2: 25 mEq/L (ref 19–32)
Calcium: 8.6 mg/dL (ref 8.4–10.5)
Chloride: 100 mEq/L (ref 96–112)
GFR calc non Af Amer: 64 mL/min — ABNORMAL LOW (ref >90)
Sodium: 135 mEq/L (ref 135–145)
Total Bilirubin: 0.4 mg/dL (ref 0.3–1.2)

## 2011-08-29 LAB — GLUCOSE, CAPILLARY
Glucose-Capillary: 103 mg/dL — ABNORMAL HIGH (ref 70–99)
Glucose-Capillary: 83 mg/dL (ref 70–99)
Glucose-Capillary: 90 mg/dL (ref 70–99)
Glucose-Capillary: 95 mg/dL (ref 70–99)
Glucose-Capillary: 99 mg/dL (ref 70–99)

## 2011-08-29 LAB — URINE MICROSCOPIC-ADD ON

## 2011-08-29 LAB — HEMOGLOBIN A1C
Hgb A1c MFr Bld: 6.2 % — ABNORMAL HIGH (ref <5.7)
Mean Plasma Glucose: 131 mg/dL — ABNORMAL HIGH (ref <117)

## 2011-08-29 MED ORDER — LORAZEPAM 2 MG/ML IJ SOLN
0.5000 mg | Freq: Once | INTRAMUSCULAR | Status: DC
  Filled 2011-08-29: qty 1

## 2011-08-29 MED ORDER — SODIUM CHLORIDE 0.9 % IV SOLN
INTRAVENOUS | Status: DC
  Administered 2011-08-29 (×2): via INTRAVENOUS

## 2011-08-29 MED ORDER — PHENOL 1.4 % MT LIQD
1.0000 | OROMUCOSAL | Status: DC | PRN
  Filled 2011-08-29: qty 177

## 2011-08-29 NOTE — Progress Notes (Signed)
I agree with above note 

## 2011-08-29 NOTE — Care Management Note (Unsigned)
    Page 1 of 1   08/30/2011     4:09:47 PM   CARE MANAGEMENT NOTE 08/30/2011  Patient:  [name], [name]   Account Number:  [id number]  Date Initiated:  08/29/2011  Documentation initiated by:  Letha Cape  Subjective/Objective Assessment:   dx sbo  admit- lives alone.  patient states pta she was independent.  She has had [facility] in the past.     Action/Plan:   Anticipated DC Date:  09/04/2011   Anticipated DC Plan:  HOME W HOME HEALTH SERVICES      DC Planning Services  CM consult      Choice offered to / List presented to:             Status of service:  In process, will continue to follow Medicare Important Message given?   (If response is "NO", the following Medicare IM given date fields will be blank) Date Medicare IM given:   Date Additional Medicare IM given:    Discharge Disposition:    Per UR Regulation:  Reviewed for med. necessity/level of care/duration of stay  If discussed at Long Length of Stay Meetings, dates discussed:    Comments:  08/30/11 16:08 Letha Cape RN, BSN [phone] patient for emergent exploratory lap today.  NCM will continue to follow for dc needs.  08/29/11 16:15 Letha Cape RN, BSN [phone] patient lives alone, she has a w/chair, rolling walker, home oxygen and nebulizer machine at home.  Patient has medication coverage and transportation.   Patient states she gets her medications from [facility].  Patient has had [place] in the past, and if she needs any hh services she would like to continue to work with [place].  NCM will continue to follow for dc needs.

## 2011-08-29 NOTE — Progress Notes (Signed)
INITIAL ADULT NUTRITION ASSESSMENT Date: 08/29/2011   Time: 11:15 AM Reason for Assessment: Nutrition Risk   INTERVENTION: 1. Once diet advances, Ensure Complete po BID, each supplement provides 350 kcal and 13 grams of protein. 2. RD will continue to follow   ASSESSMENT: Female 78 y.o.  Dx: SBO (small bowel obstruction)  Hx:  Past Medical History  Diagnosis Date  . Allergic rhinitis   . Diabetes mellitus   . COPD (chronic obstructive pulmonary disease)   . DVT (deep venous thrombosis)   . Syncope     [employer] loop recorder implantation 03/2009  . LV dysfunction     Mild, EF 45-50% by echocardiogram 02/2009  . Shortness of breath   . Asthma   . Hypertension   . Headache   . Anxiety   . Chronic cough   . Sinusitis   . Skin melanoma     "don't remember where"  . Supraventricular tachycardia     s/p RF ablation in 2000  . A-fib   . Blood transfusion   . Anemia   . H/O hiatal hernia   . Arthritis   . Angina     chronic chest pain    Related Meds:     . sodium chloride   Intravenous STAT  . digoxin  0.125 mg Intravenous Daily  .  HYDROmorphone (DILAUDID) injection  0.5 mg Intravenous Once  . insulin aspart  0-5 Units Subcutaneous Q4H  . sodium chloride  1,000 mL Intravenous Once  . sodium chloride  3 mL Intravenous Q12H  . tiotropium  18 mcg Inhalation Daily  . DISCONTD: digoxin  0.1 mg Intravenous Daily  . DISCONTD:  HYDROmorphone (DILAUDID) injection  0.5 mg Intravenous Once  . DISCONTD: insulin aspart  0-5 Units Subcutaneous QHS  . DISCONTD: insulin aspart  0-9 Units Subcutaneous TID WC  . DISCONTD:  morphine injection  4 mg Intravenous Once     Ht: 5\' 2"  (157.5 cm)  Wt: 96 lb 12.8 oz (43.908 kg)  Ideal Wt: 50 kg   % Ideal Wt: 88%  Usual Wt:  Wt Readings from Last 20 Encounters:  08/28/11 96 lb 12.8 oz (43.908 kg)  06/27/11 94 lb 9.6 oz (42.91 kg)  04/18/11 94 lb 12.8 oz (43.001 kg)  03/29/11 93 lb 3.2 oz (42.275 kg)  03/15/11 90 lb 3.2 oz  (40.914 kg)  03/08/11 92 lb 3.2 oz (41.822 kg)  03/03/11 90 lb 6.2 oz (41 kg)  02/24/11 93 lb (42.185 kg)  02/22/11 104 lb 8 oz (47.4 kg)  02/22/11 104 lb 8 oz (47.4 kg)  02/22/11 104 lb 8 oz (47.4 kg)  02/22/11 104 lb 8 oz (47.4 kg)  02/10/11 97 lb (43.999 kg)  01/19/11 102 lb 9.6 oz (46.539 kg)  01/12/11 103 lb 12.8 oz (47.083 kg)  01/03/11 107 lb 11.2 oz (48.852 kg)  12/23/10 98 lb 15.8 oz (44.9 kg)  12/22/10 101 lb (45.813 kg)  12/12/10 103 lb (46.72 kg)  09/27/10 104 lb (47.174 kg)    % Usual Wt: 92%  Body mass index is 17.70 kg/(m^2). Pt meets criteria for underweight   Food/Nutrition Related Hx: No nutrition problems indicated per nutrition risk assessment completed on admission   Labs:  CMP     Component Value Date/Time   NA 135 08/29/2011 0538   K 5.0 08/29/2011 0538   CL 100 08/29/2011 0538   CO2 25 08/29/2011 0538   GLUCOSE 103* 08/29/2011 0538   BUN 26* 08/29/2011 1610  CREATININE 0.85 08/29/2011 0538   CALCIUM 8.6 08/29/2011 0538   PROT 5.6* 08/29/2011 0538   ALBUMIN 3.0* 08/29/2011 0538   AST 15 08/29/2011 0538   ALT 9 08/29/2011 0538   ALKPHOS 53 08/29/2011 0538   BILITOT 0.4 08/29/2011 0538   GFRNONAA 64* 08/29/2011 0538   GFRAA 74* 08/29/2011 0538     Intake/Output Summary (Last 24 hours) at 08/29/11 1118 Last data filed at 08/29/11 0858  Gross per 24 hour  Intake 981.25 ml  Output    470 ml  Net 511.25 ml     Diet Order: NPO  Supplements/Tube Feeding: none   IVF:    sodium chloride Last Rate: 75 mL/hr at 08/29/11 0935  DISCONTD: 0.9 % NaCl with KCl 20 mEq / L Last Rate: 75 mL/hr at 08/28/11 2029    Estimated Nutritional Needs:   Kcal: 1400-1600 Protein:  65-75 gm  Fluid:  1.5-1.6 L   RD drawn to pt with low BMI. Per weight hx, appears pt weight has been trending up after a large weight loss in January of this year.  Pt reports that she started to lose weight about 6 months ago, unsure why she was losing. Reports about 40 lb weight loss. Per  hx highest weight in the past year was 107 lbs, and then lowest 90 lbs, 17 lb weight loss (16%) weight loss in 2 months, then weight gain for the past 6 months.  Pt states that she was not drinking any nutrition supplements PTA, but is willing once diet is advanced. Currently with NG tube for suction.   NUTRITION DIAGNOSIS: -Inadequate oral intake (NI-2.1).  Status: Ongoing  RELATED TO: Poor appetite  AS EVIDENCE BY: weight loss, Body mass index is 17.70 kg/(m^2).   MONITORING/EVALUATION(Goals): Goal: Diet to advance when medically able to meet nutrition needs  Monitor: diet advance, weight, labs, intake   EDUCATION NEEDS: -No education needs identified at this time   DOCUMENTATION CODES Per approved criteria  -Underweight    Clarene Duke RD, LDN Pager [phone] After Hours pager [phone]

## 2011-08-29 NOTE — Progress Notes (Signed)
[facility] PROGRESS NOTE  [name]:[id number] DOB: [birth year] DOA: 08/28/2011 PCP: Cain Saupe, MD  Assessment/Plan: Principal Problem:  *SBO (small bowel obstruction): PT's abdomen less distended this morning. Clinically she does appear to have had mild improvement, but very little NGT aspirate. Pt reports passing flatus.  KUB still pending at this time. Will review it once completed and re-evaluate patient later in the day. Consider surgery consult if no significant change on KUB.  Active Problems:  Prerenal acute renal failure: Cr much improved and BUN almost at baseline with hydration. Will continue with hydration. Change IVF to 0.9 NS without potassium.  HTN (hypertension): BP now within normal range after IVF resuscitation.   Diabetes mellitus: BS well controlled   Hyponatremia: resolved with IVF    A-fib: Presently in NSR   Hypotension: Resolved with IVF resuscitation   Nausea and vomiting: Secondary to SBO. NGT to LIWS   Code Status: Full Code Disposition Plan: Plan is to D/C back to home.  [name] A.  [facility] Pager [phone]. If 8PM-8AM, please contact night-coverage at [email or web address], password [facility] 08/29/2011, 9:22 AM  LOS: 1 day   Brief narrative: This is a 78 y/o lady with a H/O DM who presents to the ED with C/O nausea and abdominal pain for 3 days.Pt states that she went out and ate salad. She then started having abdominal pain and nausea but no emesis. She descibes the pain as a cramping pain which extends from epigastrium to suprapubic region but is non-radiating. She rates pain as a 7/10 at it's worse, and is unable to identify any palliative or provocative features. Pt had 1 episode of emesis after arriving in the ED which she describes as bilious but non-hematenatous in nature.  On radiologic examination in the ED, pt had findings consistent with SBO. She was also found to be hypotensive and hyponatremic in the setting of  elevated BUN and Cr.  Consultants:  None  Procedures:  None  Antibiotics:  None  HPI/Subjective: Pt states that her abdomen feels less distended and tight as compared to yesterday. (+) Flatus.  Objective: Filed Vitals:   08/28/11 2031 08/28/11 2107 08/29/11 0408 08/29/11 0833  BP:  119/68 123/71   Pulse: 76 76 79   Temp:  97.8 F (36.6 C) 97.6 F (36.4 C)   TempSrc:  Oral Oral   Resp:  20 18   Height:      Weight:      SpO2:  96% 96% 95%   Weight change:   Intake/Output Summary (Last 24 hours) at 08/29/11 0922 Last data filed at 08/29/11 0858  Gross per 24 hour  Intake 981.25 ml  Output    470 ml  Net 511.25 ml    General: Alert, awake, oriented x3, in no acute distress.  HEENT: [place]/AT PEERL, EOMI. Some mild periorbital edema (Pt states this is normal for her upon awakening in the morning) Neck: Trachea midline, no masses, no thyromegal,y no JVD, no carotid bruit  OROPHARYNX: Moist, No exudate/ erythema/lesions.  Heart: Regular rate and rhythm, without murmurs, rubs, gallops.  Lungs: Clear to auscultation, no wheezing or rhonchi noted. No increased vocal fremitus resonant to percussion  Abdomen: Soft, diffusely tender, but decreased from yesterday, mildly distended, diminished bowel sounds, no masses no hepatosplenomegaly noted.  Neuro: No focal neurological deficits noted cranial nerves II through XII grossly intact  Musculoskeletal: No warm swelling or erythema around joints, no spinal tenderness noted.  Psychiatric: Patient alert and oriented x3, good  insight and cognition, good recent to remote recall.     Data Reviewed: Basic Metabolic Panel:  Lab 08/29/11 7829 08/28/11 1344  NA 135 129*  K 5.0 4.5  CL 100 91*  CO2 25 24  GLUCOSE 103* 172*  BUN 26* 25*  CREATININE 0.85 1.15*  CALCIUM 8.6 10.0  MG -- --  PHOS -- --   Liver Function Tests:  Lab 08/29/11 0538 08/28/11 1344  AST 15 21  ALT 9 14  ALKPHOS 53 67  BILITOT 0.4 0.6  PROT 5.6* 7.2   ALBUMIN 3.0* 4.0   No results found for this basename: LIPASE:5,AMYLASE:5 in the last 168 hours No results found for this basename: AMMONIA:5 in the last 168 hours CBC:  Lab 08/29/11 0538 08/28/11 1344  WBC 3.7* 6.8  NEUTROABS 2.2 5.4  HGB 12.4 14.3  HCT 37.1 41.2  MCV 93.2 91.8  PLT 192 214   Cardiac Enzymes: No results found for this basename: CKTOTAL:5,CKMB:5,CKMBINDEX:5,TROPONINI:5 in the last 168 hours BNP (last 3 results)  Basename 01/02/11 0211  PROBNP 321.0   CBG:  Lab 08/29/11 0738 08/29/11 0406 08/29/11 0042 08/28/11 2009 08/28/11 1708  GLUCAP 95 103* 99 130* 132*    No results found for this or any previous visit (from the past 240 hour(s)).   Studies: Dg Abd Acute W/chest  08/28/2011  *RADIOLOGY REPORT*  Clinical Data: Mid abdominal pain, nausea, abdominal distention  ACUTE ABDOMEN SERIES (ABDOMEN 2 VIEW & CHEST 1 VIEW)  Comparison: 02/24/2011  Findings: Loop recorder projects over left chest. Normal heart size, mediastinal contours, and pulmonary vascularity. Emphysematous changes with chronic peribronchial thickening right base scarring. No pulmonary infiltrate, pleural effusion or pneumothorax. Diffuse osseous demineralization. Small amount gas and stool within colon. Air filled loops of minimally distended small bowel with air-fluid levels on upright view new since previous exam question small bowel obstruction. No definite bowel wall thickening or free intraperitoneal air. Pelvic phleboliths and a few scattered vascular calcifications. Degenerative changes left hip. No urinary tract calcification.  IMPRESSION: Air filled mildly distended small bowel loops with air-fluid levels on upright view new versus previous exam question small bowel obstruction. COPD.  Original Report Authenticated By: Lollie Marrow, M.D.    Scheduled Meds:   . sodium chloride   Intravenous STAT  . digoxin  0.125 mg Intravenous Daily  .  HYDROmorphone (DILAUDID) injection  0.5 mg  Intravenous Once  . insulin aspart  0-5 Units Subcutaneous Q4H  . sodium chloride  1,000 mL Intravenous Once  . sodium chloride  3 mL Intravenous Q12H  . tiotropium  18 mcg Inhalation Daily  . DISCONTD: digoxin  0.1 mg Intravenous Daily  . DISCONTD:  HYDROmorphone (DILAUDID) injection  0.5 mg Intravenous Once  . DISCONTD: insulin aspart  0-5 Units Subcutaneous QHS  . DISCONTD: insulin aspart  0-9 Units Subcutaneous TID WC  . DISCONTD:  morphine injection  4 mg Intravenous Once   Continuous Infusions:   . sodium chloride    . DISCONTD: 0.9 % NaCl with KCl 20 mEq / L 75 mL/hr at 08/28/11 2029    Principal Problem:  *SBO (small bowel obstruction) Active Problems:  Chronic respiratory failure  HTN (hypertension)  Diabetes mellitus  Hyponatremia  Anxiety  A-fib  Prerenal acute renal failure  Hypotension  Nausea and vomiting

## 2011-08-30 ENCOUNTER — Encounter (HOSPITAL_COMMUNITY)
Admission: EM | Disposition: A | Discharge status: Home / Self Care | Payer: Self-pay | Source: Home / Self Care | Attending: Internal Medicine

## 2011-08-30 ENCOUNTER — Encounter (HOSPITAL_COMMUNITY): Payer: Self-pay | Admitting: Anesthesiology

## 2011-08-30 ENCOUNTER — Inpatient Hospital Stay (HOSPITAL_COMMUNITY): Payer: Medicare Other

## 2011-08-30 ENCOUNTER — Inpatient Hospital Stay (HOSPITAL_COMMUNITY): Payer: Medicare Other | Admitting: Anesthesiology

## 2011-08-30 DIAGNOSIS — K565 Intestinal adhesions [bands], unspecified as to partial versus complete obstruction: Secondary | ICD-10-CM

## 2011-08-30 DIAGNOSIS — R109 Unspecified abdominal pain: Secondary | ICD-10-CM

## 2011-08-30 HISTORY — PX: LAPAROTOMY: SHX154

## 2011-08-30 LAB — BASIC METABOLIC PANEL
BUN: 21 mg/dL (ref 6–23)
Creatinine, Ser: 0.63 mg/dL (ref 0.50–1.10)
GFR calc Af Amer: >90 mL/min (ref >90)
GFR calc non Af Amer: 84 mL/min — ABNORMAL LOW (ref >90)
Glucose, Bld: 77 mg/dL (ref 70–99)

## 2011-08-30 LAB — CBC
HCT: 38 % (ref 36.0–46.0)
Hemoglobin: 12.7 g/dL (ref 12.0–15.0)
MCV: 95 fL (ref 78.0–100.0)
RBC: 4 MIL/uL (ref 3.87–5.11)
RDW: 14.4 % (ref 11.5–15.5)
WBC: 5.2 10*3/uL (ref 4.0–10.5)

## 2011-08-30 LAB — GLUCOSE, CAPILLARY
Glucose-Capillary: 73 mg/dL (ref 70–99)
Glucose-Capillary: 74 mg/dL (ref 70–99)
Glucose-Capillary: 69 mg/dL — ABNORMAL LOW (ref 70–99)
Glucose-Capillary: 126 mg/dL — ABNORMAL HIGH (ref 70–99)
Glucose-Capillary: 127 mg/dL — ABNORMAL HIGH (ref 70–99)
Glucose-Capillary: 136 mg/dL — ABNORMAL HIGH (ref 70–99)

## 2011-08-30 LAB — MRSA PCR SCREENING: MRSA by PCR: POSITIVE — AB

## 2011-08-30 SURGERY — LAPAROTOMY, EXPLORATORY
Anesthesia: General | Site: Abdomen | Wound class: Clean

## 2011-08-30 MED ORDER — DEXTROSE 5 % IV SOLN: INTRAVENOUS | Status: DC

## 2011-08-30 MED ORDER — 0.9 % SODIUM CHLORIDE (POUR BTL) OPTIME
TOPICAL | Status: DC | PRN
  Administered 2011-08-30: 2000 mL

## 2011-08-30 MED ORDER — MUPIROCIN 2 % EX OINT
1.0000 "application " | TOPICAL_OINTMENT | Freq: Two times a day (BID) | CUTANEOUS | Status: AC
  Administered 2011-08-30 – 2011-09-04 (×9): 1 via NASAL
  Filled 2011-08-30 (×3): qty 22

## 2011-08-30 MED ORDER — CHLORHEXIDINE GLUCONATE 0.12 % MT SOLN
15.0000 mL | Freq: Two times a day (BID) | OROMUCOSAL | Status: DC
  Administered 2011-08-31 – 2011-09-08 (×16): 15 mL via OROMUCOSAL
  Filled 2011-08-30 (×18): qty 15

## 2011-08-30 MED ORDER — ONDANSETRON HCL 4 MG/2ML IJ SOLN
INTRAMUSCULAR | Status: DC | PRN
  Administered 2011-08-30: 4 mg via INTRAVENOUS

## 2011-08-30 MED ORDER — ACETAMINOPHEN 325 MG PO TABS: 650.0000 mg | ORAL_TABLET | Freq: Four times a day (QID) | ORAL | Status: DC | PRN

## 2011-08-30 MED ORDER — ENOXAPARIN SODIUM 40 MG/0.4ML ~~LOC~~ SOLN
40.0000 mg | SUBCUTANEOUS | Status: DC
Start: 2011-08-30 — End: 2011-08-30
  Filled 2011-08-30: qty 0.4

## 2011-08-30 MED ORDER — DEXTROSE 50 % IV SOLN: 50.0000 mL | Freq: Once | INTRAVENOUS | Status: AC | PRN

## 2011-08-30 MED ORDER — SODIUM CHLORIDE 0.9 % IV SOLN
INTRAVENOUS | Status: DC
  Administered 2011-08-30 (×2): via INTRAVENOUS
  Administered 2011-08-31: 100 mL/h via INTRAVENOUS
  Administered 2011-08-31: 08:00:00 via INTRAVENOUS

## 2011-08-30 MED ORDER — SODIUM CHLORIDE 0.9 % IV SOLN
3.0000 g | Freq: Once | INTRAVENOUS | Status: AC
  Administered 2011-08-30: 3 g via INTRAVENOUS
  Filled 2011-08-30: qty 3

## 2011-08-30 MED ORDER — DIPHENHYDRAMINE HCL 50 MG/ML IJ SOLN: 12.5000 mg | Freq: Four times a day (QID) | INTRAMUSCULAR | Status: DC | PRN

## 2011-08-30 MED ORDER — METOPROLOL TARTRATE 1 MG/ML IV SOLN
INTRAVENOUS | Status: DC | PRN
  Administered 2011-08-30: 1 mg via INTRAVENOUS
  Administered 2011-08-30 (×2): 2 mg via INTRAVENOUS

## 2011-08-30 MED ORDER — GLUCAGON HCL (RDNA) 1 MG IJ SOLR: 0.5000 mg | Freq: Once | INTRAMUSCULAR | Status: AC | PRN

## 2011-08-30 MED ORDER — SODIUM CHLORIDE 0.9 % IJ SOLN: 9.0000 mL | INTRAMUSCULAR | Status: DC | PRN

## 2011-08-30 MED ORDER — GLUCOSE 40 % PO GEL: 1.0000 | ORAL | Status: DC | PRN

## 2011-08-30 MED ORDER — DEXTROSE-NACL 5-0.9 % IV SOLN
INTRAVENOUS | Status: DC
Start: 2011-08-30 — End: 2011-08-30
  Administered 2011-08-30: 06:00:00 via INTRAVENOUS

## 2011-08-30 MED ORDER — FENTANYL CITRATE 0.05 MG/ML IJ SOLN
INTRAMUSCULAR | Status: DC | PRN
  Administered 2011-08-30 (×4): 50 ug via INTRAVENOUS

## 2011-08-30 MED ORDER — ACETAMINOPHEN 10 MG/ML IV SOLN
1000.0000 mg | Freq: Once | INTRAVENOUS | Status: AC | PRN
  Filled 2011-08-30: qty 100

## 2011-08-30 MED ORDER — DEXTROSE 50 % IV SOLN
25.0000 mL | Freq: Once | INTRAVENOUS | Status: AC | PRN
  Administered 2011-08-30: 25 mL via INTRAVENOUS

## 2011-08-30 MED ORDER — ONDANSETRON HCL 4 MG/2ML IJ SOLN: 4.0000 mg | Freq: Four times a day (QID) | INTRAMUSCULAR | Status: DC | PRN

## 2011-08-30 MED ORDER — GLYCOPYRROLATE 0.2 MG/ML IJ SOLN
INTRAMUSCULAR | Status: DC | PRN
  Administered 2011-08-30: .5 mg via INTRAVENOUS

## 2011-08-30 MED ORDER — HYDROMORPHONE HCL PF 1 MG/ML IJ SOLN
INTRAMUSCULAR | Status: AC
  Filled 2011-08-30: qty 1

## 2011-08-30 MED ORDER — LACTATED RINGERS IV SOLN
INTRAVENOUS | Status: DC | PRN
  Administered 2011-08-30 (×2): via INTRAVENOUS

## 2011-08-30 MED ORDER — HYDROMORPHONE HCL PF 1 MG/ML IJ SOLN
0.2500 mg | INTRAMUSCULAR | Status: DC | PRN
  Administered 2011-08-30: 0.5 mg via INTRAVENOUS
  Administered 2011-08-30: 0.25 mg via INTRAVENOUS

## 2011-08-30 MED ORDER — BIOTENE DRY MOUTH MT LIQD
15.0000 mL | Freq: Two times a day (BID) | OROMUCOSAL | Status: DC
  Administered 2011-08-31 – 2011-09-08 (×17): 15 mL via OROMUCOSAL

## 2011-08-30 MED ORDER — DEXTROSE 50 % IV SOLN: 25.0000 mL | Freq: Once | INTRAVENOUS | Status: AC | PRN

## 2011-08-30 MED ORDER — ENOXAPARIN SODIUM 30 MG/0.3ML ~~LOC~~ SOLN
30.0000 mg | SUBCUTANEOUS | Status: DC
  Administered 2011-08-31 – 2011-09-08 (×9): 30 mg via SUBCUTANEOUS
  Filled 2011-08-30 (×9): qty 0.3

## 2011-08-30 MED ORDER — ROCURONIUM BROMIDE 100 MG/10ML IV SOLN
INTRAVENOUS | Status: DC | PRN
  Administered 2011-08-30 (×2): 20 mg via INTRAVENOUS

## 2011-08-30 MED ORDER — ONDANSETRON HCL 4 MG/2ML IJ SOLN
INTRAMUSCULAR | Status: AC
  Filled 2011-08-30: qty 2

## 2011-08-30 MED ORDER — GLUCOSE-VITAMIN C 4-6 GM-MG PO CHEW: 4.0000 | CHEWABLE_TABLET | ORAL | Status: DC | PRN

## 2011-08-30 MED ORDER — MORPHINE SULFATE (PF) 1 MG/ML IV SOLN
INTRAVENOUS | Status: DC
  Administered 2011-08-30: 17:00:00 via INTRAVENOUS
  Administered 2011-08-31 (×2): 3 mg via INTRAVENOUS
  Administered 2011-08-31: 08:00:00 via INTRAVENOUS
  Administered 2011-08-31: 7 mg via INTRAVENOUS
  Administered 2011-08-31: 4 mg via INTRAVENOUS
  Administered 2011-08-31: 8 mg via INTRAVENOUS
  Administered 2011-09-01: 1 mg via INTRAVENOUS
  Administered 2011-09-01: 2 mg via INTRAVENOUS
  Administered 2011-09-01: 7 mg via INTRAVENOUS
  Administered 2011-09-01: 5 mg via INTRAVENOUS
  Administered 2011-09-01: 3 mg via INTRAVENOUS
  Administered 2011-09-01: 10:00:00 via INTRAVENOUS
  Administered 2011-09-02 (×2): 1 mg via INTRAVENOUS
  Administered 2011-09-02: 3 mg via INTRAVENOUS
  Administered 2011-09-03: 2 mg via INTRAVENOUS
  Administered 2011-09-03: 1 mg via INTRAVENOUS
  Administered 2011-09-04: 3 mg via INTRAVENOUS
  Administered 2011-09-04 – 2011-09-05 (×2): 1 mg via INTRAVENOUS
  Filled 2011-08-30 (×2): qty 25

## 2011-08-30 MED ORDER — CHLORHEXIDINE GLUCONATE CLOTH 2 % EX PADS
6.0000 | MEDICATED_PAD | Freq: Every day | CUTANEOUS | Status: AC
  Administered 2011-08-31 – 2011-09-04 (×5): 6 via TOPICAL

## 2011-08-30 MED ORDER — LIDOCAINE HCL (CARDIAC) 20 MG/ML IV SOLN
INTRAVENOUS | Status: DC | PRN
  Administered 2011-08-30: 30 mg via INTRAVENOUS

## 2011-08-30 MED ORDER — MORPHINE SULFATE (PF) 1 MG/ML IV SOLN
INTRAVENOUS | Status: AC
  Filled 2011-08-30: qty 25

## 2011-08-30 MED ORDER — PHENYLEPHRINE HCL 10 MG/ML IJ SOLN
INTRAMUSCULAR | Status: DC | PRN
  Administered 2011-08-30 (×4): 80 ug via INTRAVENOUS

## 2011-08-30 MED ORDER — SUCCINYLCHOLINE CHLORIDE 20 MG/ML IJ SOLN
INTRAMUSCULAR | Status: DC | PRN
  Administered 2011-08-30: 100 mg via INTRAVENOUS

## 2011-08-30 MED ORDER — ONDANSETRON HCL 4 MG/2ML IJ SOLN
4.0000 mg | Freq: Once | INTRAMUSCULAR | Status: AC | PRN
  Administered 2011-08-30: 4 mg via INTRAVENOUS

## 2011-08-30 MED ORDER — NALOXONE HCL 0.4 MG/ML IJ SOLN: 0.4000 mg | INTRAMUSCULAR | Status: DC | PRN

## 2011-08-30 MED ORDER — LACTATED RINGERS IV SOLN
INTRAVENOUS | Status: DC
  Administered 2011-08-30: 13:00:00 via INTRAVENOUS

## 2011-08-30 MED ORDER — AMPICILLIN-SULBACTAM SODIUM 3 (2-1) G IJ SOLR
3.0000 g | Freq: Four times a day (QID) | INTRAMUSCULAR | Status: AC
  Administered 2011-08-30 – 2011-08-31 (×4): 3 g via INTRAVENOUS
  Filled 2011-08-30 (×4): qty 3

## 2011-08-30 MED ORDER — NEOSTIGMINE METHYLSULFATE 1 MG/ML IJ SOLN
INTRAMUSCULAR | Status: DC | PRN
  Administered 2011-08-30: 3 mg via INTRAVENOUS

## 2011-08-30 MED ORDER — DIPHENHYDRAMINE HCL 12.5 MG/5ML PO ELIX
12.5000 mg | ORAL_SOLUTION | Freq: Four times a day (QID) | ORAL | Status: DC | PRN
  Filled 2011-08-30: qty 5

## 2011-08-30 MED ORDER — MORPHINE SULFATE (PF) 1 MG/ML IV SOLN: INTRAVENOUS | Status: DC

## 2011-08-30 MED ORDER — ACETAMINOPHEN 650 MG RE SUPP: 650.0000 mg | Freq: Four times a day (QID) | RECTAL | Status: DC | PRN

## 2011-08-30 MED ORDER — GLUCAGON HCL (RDNA) 1 MG IJ SOLR: 1.0000 mg | Freq: Once | INTRAMUSCULAR | Status: AC | PRN

## 2011-08-30 MED ORDER — ENOXAPARIN SODIUM 30 MG/0.3ML ~~LOC~~ SOLN
30.0000 mg | SUBCUTANEOUS | Status: DC
  Filled 2011-08-30: qty 0.3

## 2011-08-30 MED ORDER — PANTOPRAZOLE SODIUM 40 MG IV SOLR
40.0000 mg | Freq: Every day | INTRAVENOUS | Status: DC
  Administered 2011-08-30 – 2011-09-07 (×8): 40 mg via INTRAVENOUS
  Filled 2011-08-30 (×11): qty 40

## 2011-08-30 MED ORDER — PROPOFOL 10 MG/ML IV EMUL
INTRAVENOUS | Status: DC | PRN
  Administered 2011-08-30: 100 mg via INTRAVENOUS

## 2011-08-30 MED ORDER — GLUCOSE 40 % PO GEL: 1.0000 | Freq: Once | ORAL | Status: AC | PRN

## 2011-08-30 MED ORDER — DEXTROSE 50 % IV SOLN
INTRAVENOUS | Status: AC
  Filled 2011-08-30: qty 50

## 2011-08-30 SURGICAL SUPPLY — 49 items
BLADE SURG ROTATE 9660 (MISCELLANEOUS) IMPLANT
CANISTER SUCTION 2500CC (MISCELLANEOUS) ×2 IMPLANT
CHLORAPREP W/TINT 26ML (MISCELLANEOUS) ×2 IMPLANT
CLOTH BEACON ORANGE TIMEOUT ST (SAFETY) ×2 IMPLANT
COVER SURGICAL LIGHT HANDLE (MISCELLANEOUS) ×2 IMPLANT
DRAPE LAPAROSCOPIC ABDOMINAL (DRAPES) ×2 IMPLANT
DRAPE WARM FLUID 44X44 (DRAPE) ×2 IMPLANT
DRSG COVADERM 4X14 (GAUZE/BANDAGES/DRESSINGS) ×1 IMPLANT
ELECT BLADE 6.5 EXT (BLADE) IMPLANT
ELECT CAUTERY BLADE 6.4 (BLADE) ×2 IMPLANT
ELECT REM PT RETURN 9FT ADLT (ELECTROSURGICAL) ×2
ELECTRODE REM PT RTRN 9FT ADLT (ELECTROSURGICAL) ×1 IMPLANT
GLOVE BIO SURGEON STRL SZ 6.5 (GLOVE) ×1 IMPLANT
GLOVE BIO SURGEON STRL SZ7 (GLOVE) ×2 IMPLANT
GLOVE BIOGEL PI IND STRL 6.5 (GLOVE) IMPLANT
GLOVE BIOGEL PI IND STRL 7.0 (GLOVE) IMPLANT
GLOVE BIOGEL PI IND STRL 7.5 (GLOVE) ×1 IMPLANT
GLOVE BIOGEL PI INDICATOR 6.5 (GLOVE) ×1
GLOVE BIOGEL PI INDICATOR 7.0 (GLOVE) ×1
GLOVE BIOGEL PI INDICATOR 7.5 (GLOVE) ×1
GLOVE ECLIPSE 6.5 STRL STRAW (GLOVE) ×1 IMPLANT
GLOVE SURG SS PI 7.0 STRL IVOR (GLOVE) ×1 IMPLANT
GOWN STRL NON-REIN LRG LVL3 (GOWN DISPOSABLE) ×6 IMPLANT
KIT BASIN OR (CUSTOM PROCEDURE TRAY) ×2 IMPLANT
KIT ROOM TURNOVER OR (KITS) ×2 IMPLANT
LIGASURE IMPACT 36 18CM CVD LR (INSTRUMENTS) IMPLANT
NS IRRIG 1000ML POUR BTL (IV SOLUTION) ×4 IMPLANT
PACK GENERAL/GYN (CUSTOM PROCEDURE TRAY) ×2 IMPLANT
PAD ARMBOARD 7.5X6 YLW CONV (MISCELLANEOUS) ×2 IMPLANT
RETAINER VISCERA MED (MISCELLANEOUS) ×1 IMPLANT
SPECIMEN JAR LARGE (MISCELLANEOUS) IMPLANT
SPONGE GAUZE 4X4 12PLY (GAUZE/BANDAGES/DRESSINGS) ×2 IMPLANT
SPONGE LAP 18X18 X RAY DECT (DISPOSABLE) IMPLANT
STAPLER VISISTAT 35W (STAPLE) ×2 IMPLANT
SUCTION POOLE TIP (SUCTIONS) ×2 IMPLANT
SUT PDS AB 1 TP1 96 (SUTURE) ×4 IMPLANT
SUT SILK 2 0 (SUTURE) ×2
SUT SILK 2 0 SH CR/8 (SUTURE) ×2 IMPLANT
SUT SILK 2-0 18XBRD TIE 12 (SUTURE) ×1 IMPLANT
SUT SILK 3 0 (SUTURE) ×2
SUT SILK 3 0 SH CR/8 (SUTURE) ×2 IMPLANT
SUT SILK 3-0 18XBRD TIE 12 (SUTURE) ×1 IMPLANT
SUT VIC AB 3-0 SH 27 (SUTURE) ×2
SUT VIC AB 3-0 SH 27X BRD (SUTURE) IMPLANT
TOWEL OR 17X24 6PK STRL BLUE (TOWEL DISPOSABLE) ×2 IMPLANT
TOWEL OR 17X26 10 PK STRL BLUE (TOWEL DISPOSABLE) ×2 IMPLANT
TRAY FOLEY CATH 14FRSI W/METER (CATHETERS) ×2 IMPLANT
WATER STERILE IRR 1000ML POUR (IV SOLUTION) ×1 IMPLANT
YANKAUER SUCT BULB TIP NO VENT (SUCTIONS) ×1 IMPLANT

## 2011-08-30 NOTE — Transfer of Care (Signed)
Immediate Anesthesia Transfer of Care Note  Patient: [name]  Procedure(s) Performed: Procedure(s) (LRB): EXPLORATORY LAPAROTOMY (N/A)  Patient Location: PACU  Anesthesia Type: General  Level of Consciousness: awake, alert  and oriented  Airway & Oxygen Therapy: Patient Spontanous Breathing  Post-op Assessment: Report given to PACU RN, Post -op Vital signs reviewed and stable and Patient moving all extremities  Post vital signs: Reviewed and stable  Complications: No apparent anesthesia complications

## 2011-08-30 NOTE — Progress Notes (Signed)
PATIENT DETAILS Name: [name] Age: 78 y.o. Sex: female Date of Birth: [birth year] Admit Date: 08/28/2011 OVF:IEPP, CAMMIE, MD  Subjective: Admitted with nausea, vomiting and abdominal pain on 7/15. Now has NG tube in place since admission. Claims to have no BM or Flatus since last Friday. Has diffuse abdominal pain.  Objective: Vital signs in last 24 hours: Filed Vitals:   08/29/11 0833 08/29/11 1403 08/29/11 2020 08/30/11 0436  BP:  129/72 150/76 132/74  Pulse:  72 82 90  Temp:  97.7 F (36.5 C) 98.3 F (36.8 C) 98.2 F (36.8 C)  TempSrc:  Oral Oral Tympanic  Resp:  20 16 20   Height:      Weight:      SpO2: 95% 97% 94% 95%    Assessment/Plan: Principal Problem:  *SBO (small bowel obstruction) -persistent-with no improvement despite conservative measures-no BM/Flatus since last Friday. Belly is soft but diffusely tender. -will check labs and get a Abd Xray this am -have already spoken with [facility] regarding consultation -keep NPO, continue with IVF, await labs, Abd Xray and [facility] consultation   HTN (hypertension) -Home BP meds on hold -BP controleld without any need for anti-hypertensives, however on as needed IV metoprolol   Diabetes mellitus -complicated by hypoglycemia -continue with D5 0.9 NS -stop SSI for nowe -A1C -6.2   Hyponatremia -resolved with IVF-likely pre-renal from Nausea and vomiting -rechecking lytes today   Prerenal acute renal failure -resolved with IVF -awaiting lytes today   Anxiety -currently seems stable -institute as needed Benzo's when needed  Paroxysmal A-fib -Sinus rhythm with PAC's on telemetry -resume cardizem when appropriate  COPD -stable-no signs of acute exacerbation-lungs are clear to exam -c/w spiriva -as needed Xopenex  Disposition: -remain inpatient  DVT Prophylaxis: Begin prophylactic Lovenox  Code Status: Full Code   Intake/Output from previous day:  Intake/Output Summary (Last 24 hours) at 08/30/11  0742 Last data filed at 08/30/11 0651  Gross per 24 hour  Intake 1863.75 ml  Output    950 ml  Net 913.75 ml    PHYSICAL EXAM: Weight change:  Body mass index is 17.70 kg/(m^2).  Gen Exam: Awake and alert with clear speech.  Neck: Supple, No JVD.   Chest: B/L Clear.   CVS: S1 S2 Regular, no murmurs.  Abdomen: soft, BS sluggish, diffusely  Tender all over with voluntary gaurding Extremities: no edema, lower extremities warm to touch. Neurologic: Non Focal.   Skin: No Rash.   Wounds: N/A.   CONSULTS:  general surgery-called today already-pending  LAB RESULTS: CBC  Lab 08/29/11 1034 08/29/11 0538 08/28/11 1344  WBC 3.6* 3.7* 6.8  HGB 12.0 12.4 14.3  HCT 35.9* 37.1 41.2  PLT 182 192 214  MCV 93.5 93.2 91.8  MCH 31.3 31.2 31.8  MCHC 33.4 33.4 34.7  RDW 14.7 14.6 14.3  LYMPHSABS 1.0 1.0 1.0  MONOABS 0.4 0.4 0.4  EOSABS 0.0 0.0 0.0  BASOSABS 0.0 0.0 0.0  BANDABS -- -- --    Chemistries   Lab 08/30/11 0604 08/29/11 0538 08/28/11 1344  NA 136 135 129*  K 3.9 5.0 4.5  CL 101 100 91*  CO2 22 25 24   GLUCOSE 77 103* 172*  BUN 21 26* 25*  CREATININE 0.63 0.85 1.15*  CALCIUM 8.7 8.6 10.0  MG -- -- --    CBG:  Lab 08/30/11 0645 08/30/11 0604 08/30/11 0519 08/30/11 0432 08/30/11 0020  GLUCAP 125* 69* 74 73 80    GFR Estimated Creatinine Clearance: 40.2 ml/min (  by C-G formula based on Cr of 0.63).  Coagulation profile No results found for this basename: INR:5,PROTIME:5 in the last 168 hours  Cardiac Enzymes No results found for this basename: CK:3,CKMB:3,TROPONINI:3,MYOGLOBIN:3 in the last 168 hours  No components found with this basename: POCBNP:3 No results found for this basename: DDIMER:2 in the last 72 hours  Basename 08/28/11 1949  HGBA1C 6.2*   No results found for this basename: CHOL:2,HDL:2,LDLCALC:2,TRIG:2,CHOLHDL:2,LDLDIRECT:2 in the last 72 hours No results found for this basename: TSH,T4TOTAL,FREET3,T3FREE,THYROIDAB in the last 72 hours No  results found for this basename: VITAMINB12:2,FOLATE:2,FERRITIN:2,TIBC:2,IRON:2,RETICCTPCT:2 in the last 72 hours No results found for this basename: LIPASE:2,AMYLASE:2 in the last 72 hours  Urine Studies No results found for this basename: UACOL:2,UAPR:2,USPG:2,UPH:2,UTP:2,UGL:2,UKET:2,UBIL:2,UHGB:2,UNIT:2,UROB:2,ULEU:2,UEPI:2,UWBC:2,URBC:2,UBAC:2,CAST:2,CRYS:2,UCOM:2,BILUA:2 in the last 72 hours  MICROBIOLOGY: No results found for this or any previous visit (from the past 240 hour(s)).  RADIOLOGY STUDIES/RESULTS: Dg Abd Acute W/chest  08/28/2011  *RADIOLOGY REPORT*  Clinical Data: Mid abdominal pain, nausea, abdominal distention  ACUTE ABDOMEN SERIES (ABDOMEN 2 VIEW & CHEST 1 VIEW)  Comparison: 02/24/2011  Findings: Loop recorder projects over left chest. Normal heart size, mediastinal contours, and pulmonary vascularity. Emphysematous changes with chronic peribronchial thickening right base scarring. No pulmonary infiltrate, pleural effusion or pneumothorax. Diffuse osseous demineralization. Small amount gas and stool within colon. Air filled loops of minimally distended small bowel with air-fluid levels on upright view new since previous exam question small bowel obstruction. No definite bowel wall thickening or free intraperitoneal air. Pelvic phleboliths and a few scattered vascular calcifications. Degenerative changes left hip. No urinary tract calcification.  IMPRESSION: Air filled mildly distended small bowel loops with air-fluid levels on upright view new versus previous exam question small bowel obstruction. COPD.  Original Report Authenticated By: Lollie Marrow, M.D.   Dg Abd Portable 1v  08/29/2011  *RADIOLOGY REPORT*  Clinical Data: Ileus and evaluate NG tube placement.  PORTABLE ABDOMEN - 1 VIEW  Comparison: 08/28/2011  Findings: Nasogastric tube terminates at the body of the stomach. Proximal mid small bowel loops remain dilated.  Up to 3.5 cm. Relative paucity of distal gas.  There  is  a moderate amount of ascending colonic stool. No pneumatosis or free intraperitoneal air.  Probable rectal gas.  Phleboliths in the pelvis.  IMPRESSION:  1.  Nasogastric terminating at body of stomach. 2.  Similar small bowel dilatation, suspicious for partial small bowel obstruction.  Localized adynamic ileus could look similar.  Original Report Authenticated By: Consuello Bossier, M.D.    MEDICATIONS: Scheduled Meds:   . dextrose      . digoxin  0.125 mg Intravenous Daily  . LORazepam  0.5 mg Intramuscular Once  . sodium chloride  3 mL Intravenous Q12H  . tiotropium  18 mcg Inhalation Daily  . DISCONTD: insulin aspart  0-5 Units Subcutaneous Q4H   Continuous Infusions:   . dextrose    . dextrose    . dextrose    . dextrose 5 % and 0.9% NaCl 75 mL/hr at 08/30/11 0610  . DISCONTD: sodium chloride 75 mL/hr at 08/29/11 2248  . DISCONTD: 0.9 % NaCl with KCl 20 mEq / L 75 mL/hr at 08/28/11 2029   PRN Meds:.dextrose, dextrose, dextrose, dextrose, dextrose, dextrose, dextrose, diphenhydrAMINE, glucagon, glucagon, glucagon, glucagon, glucose-Vitamin C, levalbuterol, metoprolol, morphine injection, ondansetron (ZOFRAN) IV, ondansetron, phenol  Antibiotics: Anti-infectives    None      Jeoffrey Massed, MD  [facility] Pager [phone]  If 7PM-7AM, please contact night-coverage [email or web address] Password [facility] 08/30/2011,  7:42 AM   LOS: 2 days

## 2011-08-30 NOTE — Op Note (Signed)
Preoperative diagnosis: Small bowel obstruction Postoperative diagnoses: Same as above Procedure: Exploratory laparotomy with lysis of adhesions  excision of mesenteric mass Surgeon: Dr. Harden Mo  Assistant: Dr. Almond Lint Specimens: Mesenteric mass to pathology Anesthesia: Gen. Estimated blood loss: Minimal Complications: None Drains: None Sponge and needle count was correct x2 at end of operation Disposition to recovery in stable condition  Indications: This is a 78 year old female with a history of prior abdominal surgery. She was admitted several days ago by the hospitalists. I was called today to see her for a bowel obstruction and significant abdominal pain. Upon my evaluation she did appear to have a complete obstruction with peritoneal signs. I discussed with the patient going to the operating room for laparotomy.  Procedure: After informed consent was obtained the patient was taken to the operating room. She was administered 3 g of intravenous Unasyn. Sequential compression devices were on her legs. She was placed under general anesthesia without complication. An arterial line and a Foley catheter placed without difficulty. She previously had a nasogastric tube in place. Her abdomen was then prepped and draped in the standard sterile surgical fashion. Surgical time out was then performed.  A generous midline incision was made. I entered into her peritoneum superiorly where she did not have a prior incision. There was a rush of fluid upon entering. I then carefully opened the remainder of her abdomen where there were a small amount of adhesions due to her scar on the inferior portion. Upon opening her bowel immediately came out of the abdomen as it was under some pressure and very dilated. What appeared to have occurred was that her entire small bowel had been pulled under an omental adhesion to her old scar. This caused most of her small bowel to be a little bit dusky. I lysed this  adhesion with cautery. The small bowel was then released. The small bowel was viable and pink up after a little while. I ran the bowel several times from the ligament of Treitz all the way around to the ileocecal valve as well as the colon. There were no other abnormalities. When I was looking at the cecal mesentery I did notice what appeared to be a mesenteric cyst. There was a mass present. I excised this without any difficulty and sent this off to pathology. I closed the small defect with 3-0 Vicryl suture. Again I ran the bowel and this all appeared to be viable in the deep structures appeared to be relieved. I then irrigated copiously. Her nasogastric tube was in the correct position. I then closed her with #1 looped PDS suture. I irrigated the wound and then closed the incision with staples. A sterile dressing was placed. She tolerated this well was extubated and transferred to the ICU stable.

## 2011-08-30 NOTE — Anesthesia Postprocedure Evaluation (Signed)
  Anesthesia Post-op Note  Patient: [name]  Procedure(s) Performed: Procedure(s) (LRB): EXPLORATORY LAPAROTOMY (N/A)  Patient Location: PACU  Anesthesia Type: General  Level of Consciousness: awake, alert  and oriented  Airway and Oxygen Therapy: Patient Spontanous Breathing and Patient connected to nasal cannula oxygen  Post-op Pain: mild  Post-op Assessment: Post-op Vital signs reviewed and Patient's Cardiovascular Status Stable  Post-op Vital Signs: stable  Complications: No apparent anesthesia complications

## 2011-08-30 NOTE — Consult Note (Signed)
I have seen and evaluated patient.  She has 5day history of obstructive symptoms that have not improved with last 3 days of treatment both clinically and radiologically.  On her exam she is diffusely tender, distended with peritoneal signs although she thinks this may be somewhat better.  She had small amount of flatus.  She cannot even move in bed without crying out in pain.  She has had similar episodes in past and has many medical problems specifically cardiac and pulmonary.  She also has dark bilious fluid from ng.  I think her exam and course merits laparotomy with loa today.  We discussed multiple risks including bleeding, infection, bowel resection, fistula, open wound, ostomy, cardiac/pulmonary risks.  She is agreeable and will do later today

## 2011-08-30 NOTE — Progress Notes (Signed)
CBG:69   Treatment: D50 IV 25 mL  Symptoms: Nervous/irritable  Follow-up CBG: [id number] CBG Result:125  Possible Reasons for Event: Inadequate meal intake  Comments/MD notified:NP KIRBY    [name]
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
## 2011-08-30 NOTE — Anesthesia Preprocedure Evaluation (Addendum)
Anesthesia Evaluation  Patient identified by MRN, date of birth, ID band Patient awake    Reviewed: Allergy & Precautions, H&P , NPO status , Patient's Chart, lab work & pertinent test results, reviewed documented beta blocker date and time   Airway Mallampati: II      Dental  (+) Teeth Intact and Dental Advisory Given   Pulmonary shortness of breath and with exertion, asthma , COPD COPD inhaler,    + decreased breath sounds      Cardiovascular hypertension, Pt. on medications and Pt. on home beta blockers + angina + dysrhythmias Supra Ventricular Tachycardia Rhythm:Regular Rate:Normal     Neuro/Psych PSYCHIATRIC DISORDERS Anxiety    GI/Hepatic hiatal hernia,   Endo/Other  Well Controlled, Oral Hypoglycemic Agents  Renal/GU      Musculoskeletal   Abdominal   Peds  Hematology   Anesthesia Other Findings   Reproductive/Obstetrics                          Anesthesia Physical Anesthesia Plan  ASA: III  Anesthesia Plan: General   Post-op Pain Management:    Induction: Intravenous  Airway Management Planned: Oral ETT  Additional Equipment: Arterial line  Intra-op Plan:   Post-operative Plan: Extubation in OR  Informed Consent: I have reviewed the patients History and Physical, chart, labs and discussed the procedure including the risks, benefits and alternatives for the proposed anesthesia with the patient or authorized representative who has indicated his/her understanding and acceptance.   Dental advisory given  Plan Discussed with: CRNA and Anesthesiologist  Anesthesia Plan Comments: (78 Y/O WF  1. SBO on NG suction for 3 days without resolution Type 2 DM H/O Paroxysmal Afib now in SR Htn COPD  Plan GA with art line  Kipp Brood, MD)       Anesthesia Quick Evaluation

## 2011-08-30 NOTE — Anesthesia Procedure Notes (Signed)
Procedure Name: Intubation Date/Time: 08/30/2011 3:01 PM Performed by: Rossie Muskrat L Pre-anesthesia Checklist: Patient identified, Timeout performed, Emergency Drugs available, Suction available and Patient being monitored Patient Re-evaluated:Patient Re-evaluated prior to inductionOxygen Delivery Method: Circle system utilized Preoxygenation: Pre-oxygenation with 100% oxygen Intubation Type: IV induction and Rapid sequence Laryngoscope Size: Miller and 2 Grade View: Grade I Tube type: Oral Tube size: 7.5 mm Number of attempts: 1 Airway Equipment and Method: Stylet Placement Confirmation: ETT inserted through vocal cords under direct vision,  breath sounds checked- equal and bilateral and positive ETCO2 Secured at: 20 cm Tube secured with: Tape Dental Injury: Teeth and Oropharynx as per pre-operative assessment

## 2011-08-30 NOTE — Preoperative (Signed)
Beta Blockers   Reason not to administer Beta Blockers:Concurrent use of intravenous inotropic medications during the perioperative. Patient NPO. On IV Lanoxin

## 2011-08-30 NOTE — Progress Notes (Signed)
Gave report to 2300 nurse. Pt belongings put in 2301 and necklace and earrings gave to receiving nurse.

## 2011-08-30 NOTE — Anesthesia Postprocedure Evaluation (Signed)
Anesthesia Post Note  Patient: [name]  Procedure(s) Performed: Procedure(s) (LRB): EXPLORATORY LAPAROTOMY (N/A)  Anesthesia type: General  Patient location: PACU  Post pain: Pain level controlled and Adequate analgesia  Post assessment: Post-op Vital signs reviewed, Patient's Cardiovascular Status Stable, Respiratory Function Stable, Patent Airway and Pain level controlled  Last Vitals:  Filed Vitals:   08/30/11 1800  BP: 155/64  Pulse: 68  Temp:   Resp: 21    Post vital signs: Reviewed and stable  Level of consciousness: awake, alert  and oriented  Complications: No apparent anesthesia complications

## 2011-08-31 DIAGNOSIS — I10 Essential (primary) hypertension: Secondary | ICD-10-CM

## 2011-08-31 DIAGNOSIS — I4891 Unspecified atrial fibrillation: Secondary | ICD-10-CM

## 2011-08-31 LAB — BASIC METABOLIC PANEL
CO2: 21 mEq/L (ref 19–32)
Chloride: 102 mEq/L (ref 96–112)
GFR calc Af Amer: >90 mL/min (ref >90)
Potassium: 4.9 mEq/L (ref 3.5–5.1)

## 2011-08-31 LAB — CBC
MCH: 31.4 pg (ref 26.0–34.0)
MCV: 92.6 fL (ref 78.0–100.0)
Platelets: 115 10*3/uL — ABNORMAL LOW (ref 150–400)
RDW: 14.1 % (ref 11.5–15.5)
WBC: 2.3 10*3/uL — ABNORMAL LOW (ref 4.0–10.5)

## 2011-08-31 LAB — GLUCOSE, CAPILLARY
Glucose-Capillary: 129 mg/dL — ABNORMAL HIGH (ref 70–99)
Glucose-Capillary: 117 mg/dL — ABNORMAL HIGH (ref 70–99)

## 2011-08-31 MED ORDER — SODIUM CHLORIDE 0.9 % IJ SOLN
10.0000 mL | INTRAMUSCULAR | Status: DC | PRN
  Administered 2011-08-31: 20 mL
  Administered 2011-09-06 – 2011-09-07 (×2): 10 mL

## 2011-08-31 MED ORDER — LORAZEPAM 2 MG/ML IJ SOLN
1.0000 mg | Freq: Four times a day (QID) | INTRAMUSCULAR | Status: DC | PRN
  Administered 2011-08-31: 1 mg via INTRAVENOUS
  Filled 2011-08-31: qty 1

## 2011-08-31 MED ORDER — SODIUM CHLORIDE 0.9 % IJ SOLN
10.0000 mL | Freq: Two times a day (BID) | INTRAMUSCULAR | Status: DC
  Administered 2011-09-01 – 2011-09-02 (×3): 10 mL
  Administered 2011-09-02: 10 mL via INTRAVENOUS
  Administered 2011-09-06: 10 mL
  Filled 2011-08-31: qty 10
  Filled 2011-08-31 (×2): qty 20

## 2011-08-31 MED ORDER — METOPROLOL TARTRATE 1 MG/ML IV SOLN: 5.0000 mg | Freq: Three times a day (TID) | INTRAVENOUS | Status: DC | PRN

## 2011-08-31 MED ORDER — SODIUM CHLORIDE 0.9 % IV BOLUS (SEPSIS)
500.0000 mL | Freq: Once | INTRAVENOUS | Status: AC
  Administered 2011-08-31: 500 mL via INTRAVENOUS

## 2011-08-31 NOTE — Progress Notes (Signed)
Spoke with patient's sons Gerlene Burdock and Theron Arista about placement of PICC line. Both in agreement that if mother wants the line placed then that is ok with them. IV team to come tonight to have line placed. Thresa Ross RN

## 2011-08-31 NOTE — Progress Notes (Signed)
1 Day Post-Op  Subjective: Feels better than yesterday, no flatus, pain fair controlled, some nausea  Objective: Vital signs in last 24 hours: Temp:  [97.2 F (36.2 C)-98.4 F (36.9 C)] 98.4 F (36.9 C) (07/18 0400) Pulse Rate:  [50-81] 72  (07/18 0700) Resp:  [12-28] 19  (07/18 0700) BP: (102-173)/(47-88) 125/67 mmHg (07/18 0700) SpO2:  [95 %-100 %] 98 % (07/18 0700) Arterial Line BP: (102-159)/(40-102) 107/102 mmHg September 06, 2022 2100) Weight:  [99 lb 8 oz (45.133 kg)] 99 lb 8 oz (45.133 kg) (07/18 0600) Last BM Date: 08/25/11  Intake/Output from previous day: September 06, 2022 0701 - 07/18 0700 In: 2753 [I.V.:2511; NG/GT:30; IV Piggyback:212] Out: 1440 [Urine:1290; Emesis/NG output:50; Blood:100] Intake/Output this shift:    General appearance: no distress Resp: clear to auscultation bilaterally Cardio: regular rate and rhythm GI: approp tender, no bs, dressing dry  Lab Results:   Basename 08/31/11 0410 Sep 06, 2011 0604  WBC 2.3* 5.2  HGB 11.4* 12.7  HCT 33.6* 38.0  PLT 115* 192   BMET  Basename 08/31/11 0410 2011-09-06 0604  NA 136 136  K 4.9 3.9  CL 102 101  CO2 21 22  GLUCOSE 148* 77  BUN 14 21  CREATININE 0.49* 0.63  CALCIUM 7.9* 8.7   PT/INR No results found for this basename: LABPROT:2,INR:2 in the last 72 hours ABG No results found for this basename: PHART:2,PCO2:2,PO2:2,HCO3:2 in the last 72 hours  Studies/Results: Dg Abd 2 Views  09/06/2011  *RADIOLOGY REPORT*  Clinical Data: Nausea and vomiting, abdominal pain  ABDOMEN - 2 VIEW  Comparison: August 29, 2011.  Findings: Nasogastric tube tip is in expected position of distal stomach or proximal duodenum. Several dilated loops of small bowel are noted with air fluid levels which are slightly more prominent compared to prior exam.  Phleboliths are noted in the pelvis. Probable small amount of stool identified in the rectum.  IMPRESSION: Mildly dilated small bowel loops with air fluid levels are again noted and appear slightly  more prominent compared to prior exam. This is suspicious for partial small bowel obstruction although ileus is also a possibility.  Original Report Authenticated By: Venita Sheffield., M.D.   Dg Abd Portable 1v  08/29/2011  *RADIOLOGY REPORT*  Clinical Data: Ileus and evaluate NG tube placement.  PORTABLE ABDOMEN - 1 VIEW  Comparison: 08/28/2011  Findings: Nasogastric tube terminates at the body of the stomach. Proximal mid small bowel loops remain dilated.  Up to 3.5 cm. Relative paucity of distal gas.  There  is a moderate amount of ascending colonic stool. No pneumatosis or free intraperitoneal air.  Probable rectal gas.  Phleboliths in the pelvis.  IMPRESSION:  1.  Nasogastric terminating at body of stomach. 2.  Similar small bowel dilatation, suspicious for partial small bowel obstruction.  Localized adynamic ileus could look similar.  Original Report Authenticated By: Consuello Bossier, M.D.    Anti-infectives: Anti-infectives     Start     Dose/Rate Route Frequency Ordered Stop   06-Sep-2011 2000   Ampicillin-Sulbactam (UNASYN) 3 g in sodium chloride 0.9 % 100 mL IVPB        3 g 100 mL/hr over 60 Minutes Intravenous Every 6 hours 09/06/2011 1920 08/31/11 1959   09/06/11 1500   Ampicillin-Sulbactam (UNASYN) 3 g in sodium chloride 0.9 % 100 mL IVPB        3 g 100 mL/hr over 60 Minutes Intravenous  Once September 06, 2011 1439 Sep 06, 2011 1500          Assessment/Plan:  POD 1 ex lap with loa for sbo 1. Iv pain meds 2. pulm toilet 3. Npo, ngt expect prolonged ileus, she has been five days without nutrition already, I think picc line and starting tna would be wise esp with weight loss 4. Scds, lovenox 5. Can transfer to stepdown 6. Will leave foley until pod 2 for monitoring due to marginal uop  LOS: 3 days    [facility] 08/31/2011

## 2011-08-31 NOTE — Progress Notes (Addendum)
PATIENT DETAILS Name: [name] Age: 78 y.o. Sex: female Date of Birth: [birth year] Admit Date: 08/28/2011 NWG:[id number], CAMMIE, MD  Subjective: S/P Laparotomy with Lysis of adhesion on 7/17, feels much better today.   Objective: Vital signs in last 24 hours: Filed Vitals:   08/31/11 0900 08/31/11 1000 08/31/11 1100 08/31/11 1116  BP: 120/49 117/50 112/49   Pulse: 71  75   Temp:      TempSrc:      Resp: 18 13 14    Height:      Weight:      SpO2: 100%  98% 97%    Assessment/Plan: Principal Problem:  *SBO (small bowel obstruction) -S/P Laparotomy with Lysis of adhesion on 7/17 -op note-reveiwed -post-op care per [facility] -getting PICC Line and TNA started   HTN (hypertension) -Home BP meds including metoprolol on hold as NPO,and BP is soft, she does not need these medications re-initiated at this point. We can start back as her BP increases   Diabetes mellitus -complicated by hypoglycemia on 7/17-CBG's stable since then -A1C -6.2 -monitor CBG's, and re-institute SSI as needed   Hyponatremia -resolved with IVF-likely pre-renal from Nausea and vomiting -rechecking lytes today   Prerenal acute renal failure -resolved with IVF   Anxiety -currently seems stable -institute as needed Benzo's when needed  Paroxysmal A-fib -Sinus rhythm with PAC's on telemetry -resume cardizem when appropriate-currently on hold as NPO -c/w Digoxin IV  COPD -stable-no signs of acute exacerbation-lungs are clear to exam -c/w spiriva -as needed Xopenex  Disposition: -remain inpatient-agree on moving to SDU  DVT Prophylaxis: prophylactic Lovenox  Code Status: Full Code   Intake/Output from previous day:  Intake/Output Summary (Last 24 hours) at 08/31/11 1124 Last data filed at 08/31/11 1100  Gross per 24 hour  Intake   3561 ml  Output   1465 ml  Net   2096 ml    PHYSICAL EXAM: Weight change:  Body mass index is 18.20 kg/(m^2).  Gen Exam: Awake and alert with clear  speech.  Neck: Supple, No JVD.   Chest: B/L Clear.   CVS: S1 S2 Regular, no murmurs.  Abdomen: soft, BS sluggish, diffusely  Tender all over with voluntary gaurding Extremities: no edema, lower extremities warm to touch. Neurologic: Non Focal.   Skin: No Rash.   Wounds: N/A.   CONSULTS:  general surgery-called today already-pending  LAB RESULTS: CBC  Lab 08/31/11 0410 08/30/11 0604 08/29/11 1034 08/29/11 0538 08/28/11 1344  WBC 2.3* 5.2 3.6* 3.7* 6.8  HGB 11.4* 12.7 12.0 12.4 14.3  HCT 33.6* 38.0 35.9* 37.1 41.2  PLT 115* 192 182 192 214  MCV 92.6 95.0 93.5 93.2 91.8  MCH 31.4 31.8 31.3 31.2 31.8  MCHC 33.9 33.4 33.4 33.4 34.7  RDW 14.1 14.4 14.7 14.6 14.3  LYMPHSABS -- -- 1.0 1.0 1.0  MONOABS -- -- 0.4 0.4 0.4  EOSABS -- -- 0.0 0.0 0.0  BASOSABS -- -- 0.0 0.0 0.0  BANDABS -- -- -- -- --    Chemistries   Lab 08/31/11 0410 08/30/11 0604 08/29/11 0538 08/28/11 1344  NA 136 136 135 129*  K 4.9 3.9 5.0 4.5  CL 102 101 100 91*  CO2 21 22 25 24   GLUCOSE 148* 77 103* 172*  BUN 14 21 26* 25*  CREATININE 0.49* 0.63 0.85 1.15*  CALCIUM 7.9* 8.7 8.6 10.0  MG -- -- -- --    CBG:  Lab 08/31/11 0736 08/31/11 0417 08/30/11 2349 08/30/11 1934 08/30/11 1633  GLUCAP  135* 134* 138* 161* 136*    GFR Estimated Creatinine Clearance: 41.3 ml/min (by C-G formula based on Cr of 0.49).  Coagulation profile No results found for this basename: INR:5,PROTIME:5 in the last 168 hours  Cardiac Enzymes No results found for this basename: CK:3,CKMB:3,TROPONINI:3,MYOGLOBIN:3 in the last 168 hours  No components found with this basename: POCBNP:3 No results found for this basename: DDIMER:2 in the last 72 hours  Basename 08/28/11 1949  HGBA1C 6.2*   No results found for this basename: CHOL:2,HDL:2,LDLCALC:2,TRIG:2,CHOLHDL:2,LDLDIRECT:2 in the last 72 hours No results found for this basename: TSH,T4TOTAL,FREET3,T3FREE,THYROIDAB in the last 72 hours No results found for this basename:  VITAMINB12:2,FOLATE:2,FERRITIN:2,TIBC:2,IRON:2,RETICCTPCT:2 in the last 72 hours No results found for this basename: LIPASE:2,AMYLASE:2 in the last 72 hours  Urine Studies No results found for this basename: UACOL:2,UAPR:2,USPG:2,UPH:2,UTP:2,UGL:2,UKET:2,UBIL:2,UHGB:2,UNIT:2,UROB:2,ULEU:2,UEPI:2,UWBC:2,URBC:2,UBAC:2,CAST:2,CRYS:2,UCOM:2,BILUA:2 in the last 72 hours  MICROBIOLOGY: Recent Results (from the past 240 hour(s))  MRSA PCR SCREENING     Status: Abnormal   Collection Time   08/30/11 12:55 PM      Component Value Range Status Comment   MRSA by PCR POSITIVE (*) NEGATIVE Final     RADIOLOGY STUDIES/RESULTS: Dg Abd Acute W/chest  08/28/2011  *RADIOLOGY REPORT*  Clinical Data: Mid abdominal pain, nausea, abdominal distention  ACUTE ABDOMEN SERIES (ABDOMEN 2 VIEW & CHEST 1 VIEW)  Comparison: 02/24/2011  Findings: Loop recorder projects over left chest. Normal heart size, mediastinal contours, and pulmonary vascularity. Emphysematous changes with chronic peribronchial thickening right base scarring. No pulmonary infiltrate, pleural effusion or pneumothorax. Diffuse osseous demineralization. Small amount gas and stool within colon. Air filled loops of minimally distended small bowel with air-fluid levels on upright view new since previous exam question small bowel obstruction. No definite bowel wall thickening or free intraperitoneal air. Pelvic phleboliths and a few scattered vascular calcifications. Degenerative changes left hip. No urinary tract calcification.  IMPRESSION: Air filled mildly distended small bowel loops with air-fluid levels on upright view new versus previous exam question small bowel obstruction. COPD.  Original Report Authenticated By: Lollie Marrow, M.D.   Dg Abd Portable 1v  08/29/2011  *RADIOLOGY REPORT*  Clinical Data: Ileus and evaluate NG tube placement.  PORTABLE ABDOMEN - 1 VIEW  Comparison: 08/28/2011  Findings: Nasogastric tube terminates at the body of the stomach.  Proximal mid small bowel loops remain dilated.  Up to 3.5 cm. Relative paucity of distal gas.  There  is a moderate amount of ascending colonic stool. No pneumatosis or free intraperitoneal air.  Probable rectal gas.  Phleboliths in the pelvis.  IMPRESSION:  1.  Nasogastric terminating at body of stomach. 2.  Similar small bowel dilatation, suspicious for partial small bowel obstruction.  Localized adynamic ileus could look similar.  Original Report Authenticated By: Consuello Bossier, M.D.    MEDICATIONS: Scheduled Meds:    . ampicillin-sulbactam (UNASYN) IV  3 g Intravenous Once  . ampicillin-sulbactam (UNASYN) IV  3 g Intravenous Q6H  . antiseptic oral rinse  15 mL Mouth Rinse q12n4p  . chlorhexidine  15 mL Mouth Rinse BID  . Chlorhexidine Gluconate Cloth  6 each Topical Q0600  . dextrose      . digoxin  0.125 mg Intravenous Daily  . enoxaparin (LOVENOX) injection  30 mg Subcutaneous Q24H  . HYDROmorphone      . morphine   Intravenous Q4H  . morphine      . mupirocin ointment  1 application Nasal BID  . ondansetron      . pantoprazole (PROTONIX) IV  40 mg Intravenous QHS  . sodium chloride  500 mL Intravenous Once  . tiotropium  18 mcg Inhalation Daily  . DISCONTD: enoxaparin (LOVENOX) injection  30 mg Subcutaneous Q24H  . DISCONTD: LORazepam  0.5 mg Intramuscular Once  . DISCONTD: morphine   Intravenous Q4H  . DISCONTD: sodium chloride  3 mL Intravenous Q12H   Continuous Infusions:    . sodium chloride 100 mL/hr at 08/31/11 0802  . dextrose    . dextrose    . dextrose    . DISCONTD: dextrose 5 % and 0.9% NaCl 75 mL/hr at 08/30/11 0610  . DISCONTD: lactated ringers 50 mL/hr at 08/30/11 1327   PRN Meds:.acetaminophen, acetaminophen, acetaminophen, dextrose, dextrose, dextrose, dextrose, dextrose, dextrose, diphenhydrAMINE, diphenhydrAMINE, glucagon, glucagon, glucagon, glucagon, HYDROmorphone (DILAUDID) injection, levalbuterol, LORazepam, metoprolol, naloxone, ondansetron  (ZOFRAN) IV, ondansetron (ZOFRAN) IV, phenol, sodium chloride, DISCONTD: 0.9 % irrigation (POUR BTL), DISCONTD: diphenhydrAMINE, DISCONTD: diphenhydrAMINE DISCONTD: diphenhydrAMINE, DISCONTD: glucose-Vitamin C, DISCONTD:  morphine injection, DISCONTD: naloxone, DISCONTD: ondansetron (ZOFRAN) IV, DISCONTD: ondansetron, DISCONTD: sodium chloride  Antibiotics: Anti-infectives     Start     Dose/Rate Route Frequency Ordered Stop   08/30/11 2000   Ampicillin-Sulbactam (UNASYN) 3 g in sodium chloride 0.9 % 100 mL IVPB        3 g 100 mL/hr over 60 Minutes Intravenous Every 6 hours 08/30/11 1920 08/31/11 1959   08/30/11 1500   Ampicillin-Sulbactam (UNASYN) 3 g in sodium chloride 0.9 % 100 mL IVPB        3 g 100 mL/hr over 60 Minutes Intravenous  Once 08/30/11 1439 08/30/11 1500          Jeoffrey Massed, MD  [facility] Pager [phone]  If 7PM-7AM, please contact night-coverage [email or web address] Password [email or web address] 08/31/2011, 11:24 AM   LOS: 3 days

## 2011-08-31 NOTE — Progress Notes (Signed)
Peripherally Inserted Central Catheter/Midline Placement  The IV Nurse has discussed with the patient and/or persons authorized to consent for the patient, the purpose of this procedure and the potential benefits and risks involved with this procedure.  The benefits include less needle sticks, lab draws from the catheter and patient may be discharged home with the catheter.  Risks include, but not limited to, infection, bleeding, blood clot (thrombus formation), and puncture of an artery; nerve damage and irregular heat beat.  Alternatives to this procedure were also discussed.  PICC/Midline Placement Documentation  PICC / Midline Double Lumen 08/31/11 PICC Right Basilic (Active)  Indication for Insertion or Continuance of Line Administration of hyperosmolar/irritating solutions (i.e. TPN, Vancomycin, etc.) 08/31/2011  9:00 PM  Length mark (cm) 2 cm 08/31/2011  9:00 PM  Site Assessment Clean;Dry;Intact 08/31/2011  9:00 PM  Lumen #1 Status Flushed;Saline locked 08/31/2011  9:00 PM  Lumen #2 Status Flushed;Saline locked 08/31/2011  9:00 PM  Dressing Type Transparent 08/31/2011  9:00 PM  Dressing Status Clean;Dry;Intact;Antimicrobial disc in place 08/31/2011  9:00 PM  Dressing Change Due 09/07/11 08/31/2011  9:00 PM       Geoffery Spruce 08/31/2011, 9:37 PM

## 2011-09-01 ENCOUNTER — Encounter (HOSPITAL_COMMUNITY): Payer: Self-pay | Admitting: General Surgery

## 2011-09-01 LAB — CBC
MCH: 31.2 pg (ref 26.0–34.0)
MCHC: 33.1 g/dL (ref 30.0–36.0)
Platelets: 160 10*3/uL (ref 150–400)
RDW: 14.2 % (ref 11.5–15.5)

## 2011-09-01 LAB — GLUCOSE, CAPILLARY
Glucose-Capillary: 112 mg/dL — ABNORMAL HIGH (ref 70–99)
Glucose-Capillary: 88 mg/dL (ref 70–99)
Glucose-Capillary: 93 mg/dL (ref 70–99)
Glucose-Capillary: 99 mg/dL (ref 70–99)

## 2011-09-01 LAB — DIFFERENTIAL
Basophils Absolute: 0 10*3/uL (ref 0.0–0.1)
Basophils Relative: 1 % (ref 0–1)
Eosinophils Absolute: 0 10*3/uL (ref 0.0–0.7)
Monocytes Relative: 11 % (ref 3–12)
Neutrophils Relative %: 63 % (ref 43–77)

## 2011-09-01 LAB — PREALBUMIN: Prealbumin: 9.8 mg/dL — ABNORMAL LOW (ref 17.0–34.0)

## 2011-09-01 LAB — COMPREHENSIVE METABOLIC PANEL
BUN: 13 mg/dL (ref 6–23)
CO2: 22 mEq/L (ref 19–32)
Calcium: 7.7 mg/dL — ABNORMAL LOW (ref 8.4–10.5)
Chloride: 106 mEq/L (ref 96–112)
Creatinine, Ser: 0.56 mg/dL (ref 0.50–1.10)
GFR calc non Af Amer: 87 mL/min — ABNORMAL LOW (ref >90)
Total Bilirubin: 0.2 mg/dL — ABNORMAL LOW (ref 0.3–1.2)

## 2011-09-01 LAB — MAGNESIUM: Magnesium: 1.8 mg/dL (ref 1.5–2.5)

## 2011-09-01 MED ORDER — ZINC TRACE METAL 1 MG/ML IV SOLN
INTRAVENOUS | Status: AC
  Administered 2011-09-01: 17:00:00 via INTRAVENOUS
  Filled 2011-09-01: qty 1000

## 2011-09-01 MED ORDER — SODIUM CHLORIDE 0.9 % IV SOLN: INTRAVENOUS | Status: AC

## 2011-09-01 MED ORDER — WHITE PETROLATUM GEL
Status: AC
  Administered 2011-09-01: 21:00:00
  Filled 2011-09-01: qty 5

## 2011-09-01 MED ORDER — SODIUM CHLORIDE 0.9 % IV SOLN
INTRAVENOUS | Status: DC
  Administered 2011-09-01 – 2011-09-07 (×6): via INTRAVENOUS

## 2011-09-01 MED ORDER — INSULIN ASPART 100 UNIT/ML ~~LOC~~ SOLN
0.0000 [IU] | SUBCUTANEOUS | Status: DC
  Administered 2011-09-01: 2 [IU] via SUBCUTANEOUS
  Administered 2011-09-02: 3 [IU] via SUBCUTANEOUS
  Administered 2011-09-02 (×2): 2 [IU] via SUBCUTANEOUS
  Administered 2011-09-02: 3 [IU] via SUBCUTANEOUS
  Administered 2011-09-02 – 2011-09-04 (×7): 2 [IU] via SUBCUTANEOUS
  Administered 2011-09-04: 1 [IU] via SUBCUTANEOUS
  Administered 2011-09-04: 2 [IU] via SUBCUTANEOUS
  Administered 2011-09-04 – 2011-09-05 (×8): 1 [IU] via SUBCUTANEOUS
  Administered 2011-09-05: 2 [IU] via SUBCUTANEOUS
  Administered 2011-09-06: 1 [IU] via SUBCUTANEOUS
  Administered 2011-09-06: 2 [IU] via SUBCUTANEOUS
  Administered 2011-09-06 – 2011-09-08 (×10): 1 [IU] via SUBCUTANEOUS

## 2011-09-01 MED ORDER — METOPROLOL TARTRATE 1 MG/ML IV SOLN: 5.0000 mg | Freq: Three times a day (TID) | INTRAVENOUS | Status: DC | PRN

## 2011-09-01 MED ORDER — FAT EMULSION 20 % IV EMUL
250.0000 mL | INTRAVENOUS | Status: AC
  Administered 2011-09-01: 250 mL via INTRAVENOUS
  Filled 2011-09-01: qty 250

## 2011-09-01 MED ORDER — SODIUM CHLORIDE 0.9 % IJ SOLN
INTRAMUSCULAR | Status: AC
  Administered 2011-09-01: 10 mL
  Filled 2011-09-01: qty 20

## 2011-09-01 NOTE — Progress Notes (Signed)
Patient noted to have increased amount of ectopy in the form of PVCs while lying on right side. Patient asleep during episodes. Turned patient onto back, ectopy significantly decreased. Morning labs drawn at this time. Thresa Ross RN

## 2011-09-01 NOTE — Progress Notes (Signed)
2 Days Post-Op  Subjective: No complaints, no flatus/bm some afib overnight  Objective: Vital signs in last 24 hours: Temp:  [97.9 F (36.6 C)-99 F (37.2 C)] 98.3 F (36.8 C) (07/19 0400) Pulse Rate:  [70-89] 78  (07/19 0700) Resp:  [13-23] 19  (07/19 0700) BP: (106-139)/(41-61) 129/59 mmHg (07/19 0700) SpO2:  [93 %-100 %] 99 % (07/19 0700) Arterial Line BP: (129)/(51) 129/51 mmHg (07/18 0800) Weight:  [102 lb 8 oz (46.494 kg)] 102 lb 8 oz (46.494 kg) (07/19 0600) Last BM Date: 08/25/11  Intake/Output from previous day: 07/18 0701 - 07/19 0700 In: 2854 [I.V.:2644; IV Piggyback:210] Out: 1180 [Urine:980; Emesis/NG output:200] Intake/Output this shift:    General appearance: no distress GI: no bs, wound clean without infection, approp tender  Lab Results:   Basename 09/01/11 0300 08/31/11 0410  WBC 2.5* 2.3*  HGB 9.9* 11.4*  HCT 29.9* 33.6*  PLT 160 115*   BMET  Basename 09/01/11 0300 08/31/11 0410  NA 139 136  K 3.6 4.9  CL 106 102  CO2 22 21  GLUCOSE 112* 148*  BUN 13 14  CREATININE 0.56 0.49*  CALCIUM 7.7* 7.9*   PT/INR No results found for this basename: LABPROT:2,INR:2 in the last 72 hours ABG No results found for this basename: PHART:2,PCO2:2,PO2:2,HCO3:2 in the last 72 hours  Studies/Results: Dg Abd 2 Views  2011-09-10  *RADIOLOGY REPORT*  Clinical Data: Nausea and vomiting, abdominal pain  ABDOMEN - 2 VIEW  Comparison: August 29, 2011.  Findings: Nasogastric tube tip is in expected position of distal stomach or proximal duodenum. Several dilated loops of small bowel are noted with air fluid levels which are slightly more prominent compared to prior exam.  Phleboliths are noted in the pelvis. Probable small amount of stool identified in the rectum.  IMPRESSION: Mildly dilated small bowel loops with air fluid levels are again noted and appear slightly more prominent compared to prior exam. This is suspicious for partial small bowel obstruction although ileus  is also a possibility.  Original Report Authenticated By: Venita Sheffield., M.D.    Anti-infectives: Anti-infectives     Start     Dose/Rate Route Frequency Ordered Stop   09/10/11 2000   Ampicillin-Sulbactam (UNASYN) 3 g in sodium chloride 0.9 % 100 mL IVPB        3 g 100 mL/hr over 60 Minutes Intravenous Every 6 hours 09-10-2011 1920 08/31/11 1517   Sep 10, 2011 1500   Ampicillin-Sulbactam (UNASYN) 3 g in sodium chloride 0.9 % 100 mL IVPB        3 g 100 mL/hr over 60 Minutes Intravenous  Once 09/10/2011 1439 Sep 10, 2011 1500          Assessment/Plan: POD 2 ex lap loa 1. pca 2. pulm toilet/oob 3. Appreciate trh care 4. Expect prolonged ileus, cont ng/npo, start tna today hopefully 5. Lovenox, scds 6. Can stop abx   LOS: 4 days    [facility] 09/01/2011

## 2011-09-01 NOTE — Progress Notes (Signed)
PATIENT DETAILS Name: [name] Age: 78 y.o. Sex: female Date of Birth: [birth year] Admit Date: 08/28/2011 JXB:JYNW, CAMMIE, MD  Subjective: No specific complaints other than not liking the bed.    Objective: Vital signs in last 24 hours: Filed Vitals:   09/01/11 1700 09/01/11 1800 09/01/11 1905 09/01/11 2000  BP: 123/53 123/82 139/64 125/55  Pulse: 70 69 72 70  Temp:   98.3 F (36.8 C) 98.3 F (36.8 C)  TempSrc:   Oral Oral  Resp: 19 18 14 12   Height:      Weight:      SpO2: 100% 100% 100% 100%    Assessment/Plan: Principal Problem:  *SBO (small bowel obstruction) -S/P Laparotomy with Lysis of adhesion on 7/17 -op note-reveiwed -post-op care per [facility] -PICC Line and TNA started - bowel sounds noted now- will let surgery manage- f/u output from NG   HTN (hypertension) -Home BP meds including metoprolol on hold as NPO and BP low/controlled without it PRN Metoprolol ordered but she has not needed it due to controlled BP.    Diabetes mellitus -complicated by hypoglycemia on 7/17-CBG's stable since then -A1C -6.2 -monitor CBG's, and re-institute SSI as needed   Hyponatremia -resolved with IVF-likely pre-renal from Nausea and vomiting   Prerenal acute renal failure -resolved with IVF   Anxiety -currently seems stable -institute as needed Benzo's when needed  Paroxysmal A-fib -Sinus rhythm with PAC's on telemetry -resume cardizem when appropriate-currently on hold as NPO -c/w Digoxin IV - PRN metoprolol ordered for HTN or rapid HR but not needed  COPD -stable-no signs of acute exacerbation-lungs are clear to exam -c/w spiriva -as needed Xopenex  Disposition: -remain in SDU  DVT Prophylaxis: prophylactic Lovenox  Code Status: Full Code   Intake/Output from previous day:  Intake/Output Summary (Last 24 hours) at 09/01/11 2248 Last data filed at 09/01/11 1800  Gross per 24 hour  Intake   2082 ml  Output    745 ml  Net   1337 ml     PHYSICAL EXAM: Weight change: 1.361 kg (3 lb) Body mass index is 18.75 kg/(m^2).  Gen Exam: Awake and alert with clear speech.  Neck: Supple, No JVD.   Chest: B/L Clear.   CVS: S1 S2 Regular, no murmurs.  Abdomen: soft, BS positive, diffusely tender  Extremities: no edema, lower extremities warm to touch. Neurologic: Non Focal.   Skin: No Rash.   Wounds: N/A.   CONSULTS:  general surgery  LAB RESULTS: CBC  Lab 09/01/11 0300 08/31/11 0410 08/30/11 0604 08/29/11 1034 08/29/11 0538 08/28/11 1344  WBC 2.5* 2.3* 5.2 3.6* 3.7* --  HGB 9.9* 11.4* 12.7 12.0 12.4 --  HCT 29.9* 33.6* 38.0 35.9* 37.1 --  PLT 160 115* 192 182 192 --  MCV 94.3 92.6 95.0 93.5 93.2 --  MCH 31.2 31.4 31.8 31.3 31.2 --  MCHC 33.1 33.9 33.4 33.4 33.4 --  RDW 14.2 14.1 14.4 14.7 14.6 --  LYMPHSABS 0.7 -- -- 1.0 1.0 1.0  MONOABS 0.3 -- -- 0.4 0.4 0.4  EOSABS 0.0 -- -- 0.0 0.0 0.0  BASOSABS 0.0 -- -- 0.0 0.0 0.0  BANDABS -- -- -- -- -- --    Chemistries   Lab 09/01/11 0300 08/31/11 0410 08/30/11 0604 08/29/11 0538 08/28/11 1344  NA 139 136 136 135 129*  K 3.6 4.9 3.9 5.0 4.5  CL 106 102 101 100 91*  CO2 22 21 22 25 24   GLUCOSE 112* 148* 77 103* 172*  BUN 13  14 21 26* 25*  CREATININE 0.56 0.49* 0.63 0.85 1.15*  CALCIUM 7.7* 7.9* 8.7 8.6 10.0  MG 1.8 -- -- -- --    CBG:  Lab 09/01/11 2007 09/01/11 1601 09/01/11 1139 09/01/11 0747 09/01/11 0359  GLUCAP 179* 99 88 93 97    GFR Estimated Creatinine Clearance: 42.5 ml/min (by C-G formula based on Cr of 0.56).  Coagulation profile No results found for this basename: INR:5,PROTIME:5 in the last 168 hours  Cardiac Enzymes No results found for this basename: CK:3,CKMB:3,TROPONINI:3,MYOGLOBIN:3 in the last 168 hours  No components found with this basename: POCBNP:3 No results found for this basename: DDIMER:2 in the last 72 hours No results found for this basename: HGBA1C:2 in the last 72 hours  Basename 09/01/11 0300  CHOL 94  HDL --   LDLCALC --  TRIG 55  CHOLHDL --  LDLDIRECT --   No results found for this basename: TSH,T4TOTAL,FREET3,T3FREE,THYROIDAB in the last 72 hours No results found for this basename: VITAMINB12:2,FOLATE:2,FERRITIN:2,TIBC:2,IRON:2,RETICCTPCT:2 in the last 72 hours No results found for this basename: LIPASE:2,AMYLASE:2 in the last 72 hours  Urine Studies No results found for this basename: UACOL:2,UAPR:2,USPG:2,UPH:2,UTP:2,UGL:2,UKET:2,UBIL:2,UHGB:2,UNIT:2,UROB:2,ULEU:2,UEPI:2,UWBC:2,URBC:2,UBAC:2,CAST:2,CRYS:2,UCOM:2,BILUA:2 in the last 72 hours  MICROBIOLOGY: Recent Results (from the past 240 hour(s))  MRSA PCR SCREENING     Status: Abnormal   Collection Time   08/30/11 12:55 PM      Component Value Range Status Comment   MRSA by PCR POSITIVE (*) NEGATIVE Final     RADIOLOGY STUDIES/RESULTS: Dg Abd Acute W/chest  08/28/2011  *RADIOLOGY REPORT*  Clinical Data: Mid abdominal pain, nausea, abdominal distention  ACUTE ABDOMEN SERIES (ABDOMEN 2 VIEW & CHEST 1 VIEW)  Comparison: 02/24/2011  Findings: Loop recorder projects over left chest. Normal heart size, mediastinal contours, and pulmonary vascularity. Emphysematous changes with chronic peribronchial thickening right base scarring. No pulmonary infiltrate, pleural effusion or pneumothorax. Diffuse osseous demineralization. Small amount gas and stool within colon. Air filled loops of minimally distended small bowel with air-fluid levels on upright view new since previous exam question small bowel obstruction. No definite bowel wall thickening or free intraperitoneal air. Pelvic phleboliths and a few scattered vascular calcifications. Degenerative changes left hip. No urinary tract calcification.  IMPRESSION: Air filled mildly distended small bowel loops with air-fluid levels on upright view new versus previous exam question small bowel obstruction. COPD.  Original Report Authenticated By: Lollie Marrow, M.D.   Dg Abd Portable 1v  08/29/2011   *RADIOLOGY REPORT*  Clinical Data: Ileus and evaluate NG tube placement.  PORTABLE ABDOMEN - 1 VIEW  Comparison: 08/28/2011  Findings: Nasogastric tube terminates at the body of the stomach. Proximal mid small bowel loops remain dilated.  Up to 3.5 cm. Relative paucity of distal gas.  There  is a moderate amount of ascending colonic stool. No pneumatosis or free intraperitoneal air.  Probable rectal gas.  Phleboliths in the pelvis.  IMPRESSION:  1.  Nasogastric terminating at body of stomach. 2.  Similar small bowel dilatation, suspicious for partial small bowel obstruction.  Localized adynamic ileus could look similar.  Original Report Authenticated By: Consuello Bossier, M.D.    MEDICATIONS: Scheduled Meds:    . antiseptic oral rinse  15 mL Mouth Rinse q12n4p  . chlorhexidine  15 mL Mouth Rinse BID  . Chlorhexidine Gluconate Cloth  6 each Topical Q0600  . digoxin  0.125 mg Intravenous Daily  . enoxaparin (LOVENOX) injection  30 mg Subcutaneous Q24H  . insulin aspart  0-9 Units Subcutaneous Q4H  .  morphine   Intravenous Q4H  . mupirocin ointment  1 application Nasal BID  . pantoprazole (PROTONIX) IV  40 mg Intravenous QHS  . sodium chloride  10-40 mL Intracatheter Q12H  . sodium chloride      . tiotropium  18 mcg Inhalation Daily  . white petrolatum       Continuous Infusions:    . sodium chloride 60 mL/hr at 09/01/11 2101  . sodium chloride 100 mL/hr (09/01/11 1158)  . dextrose    . dextrose    . dextrose    . TPN (CLINIMIX) +/- additives 40 mL/hr at 09/01/11 1711   And  . fat emulsion 250 mL (09/01/11 1711)  . DISCONTD: sodium chloride 100 mL/hr (08/31/11 2151)   PRN Meds:.acetaminophen, acetaminophen, dextrose, diphenhydrAMINE, levalbuterol, LORazepam, metoprolol, naloxone, ondansetron (ZOFRAN) IV, phenol, sodium chloride, sodium chloride  Antibiotics: Anti-infectives     Start     Dose/Rate Route Frequency Ordered Stop   08/30/11 2000   Ampicillin-Sulbactam (UNASYN) 3 g in  sodium chloride 0.9 % 100 mL IVPB        3 g 100 mL/hr over 60 Minutes Intravenous Every 6 hours 08/30/11 1920 08/31/11 1517   08/30/11 1500   Ampicillin-Sulbactam (UNASYN) 3 g in sodium chloride 0.9 % 100 mL IVPB        3 g 100 mL/hr over 60 Minutes Intravenous  Once 08/30/11 1439 08/30/11 1500          Deston Bilyeu, MD  [facility] Pager [phone]  If 7PM-7AM, please contact night-coverage [email or web address] Password [email or web address] 09/01/2011, 10:48 PM   LOS: 4 days

## 2011-09-01 NOTE — Progress Notes (Addendum)
PARENTERAL NUTRITION CONSULT NOTE - INITIAL  Pharmacy Consult for TPN  Indication: ileus  Allergies  Allergen Reactions  . Epinephrine Other (See Comments)    Patient thinks her heart stopped and the MD said never to take this again.  Berle Mull Dye (Iodinated Diagnostic Agents) Other (See Comments)    Unknown-patient said she thinks her heart stopped, but she really can't remember  . Albuterol Sulfate Nausea And Vomiting    Pt reports she is able to tolerate the albuterol hfa, not the nebulizer solution.  Jonne Ply (Aspirin) Other (See Comments)    Heart doctor told her to take advil instead    Patient Measurements: Height: 5\' 2"  (157.5 cm) Weight: 102 lb 8 oz (46.494 kg) IBW/kg (Calculated) : 50.1  Adjusted Body Weight: 50.1 Usual Weight: 40.9 kg-48 kg  Vital Signs: Temp: 97.5 F (36.4 C) (07/19 0741) Temp src: Oral (07/19 0741) BP: 129/59 mmHg (07/19 0700) Pulse Rate: 78  (07/19 0700) Intake/Output from previous day: 07/18 0701 - 07/19 0700 In: 2854 [I.V.:2644; IV Piggyback:210] Out: 1180 [Urine:980; Emesis/NG output:200] Intake/Output from this shift:    Labs:  [employer] 09/01/11 0300 08/31/11 0410 08/30/11 0604  WBC 2.5* 2.3* 5.2  HGB 9.9* 11.4* 12.7  HCT 29.9* 33.6* 38.0  PLT 160 115* 192  APTT -- -- --  INR -- -- --     Basename 09/01/11 0300 08/31/11 0410 08/30/11 0604  NA 139 136 136  K 3.6 4.9 3.9  CL 106 102 101  CO2 22 21 22   GLUCOSE 112* 148* 77  BUN 13 14 21   CREATININE 0.56 0.49* 0.63  LABCREA -- -- --  CREAT24HRUR -- -- --  CALCIUM 7.7* 7.9* 8.7  MG 1.8 -- --  PHOS 2.4 -- --  PROT 4.6* -- --  ALBUMIN 2.2* -- --  AST 22 -- --  ALT 11 -- --  ALKPHOS 43 -- --  BILITOT 0.2* -- --  BILIDIR -- -- --  IBILI -- -- --  PREALBUMIN -- -- --  TRIG 55 -- --  CHOLHDL -- -- --  CHOL 94 -- --   Estimated Creatinine Clearance: 42.5 ml/min (by C-G formula based on Cr of 0.56).    Basename 09/01/11 0359 08/31/11 2343 08/31/11 1922  GLUCAP 97 112* 117*     Medical History: Past Medical History  Diagnosis Date  . Allergic rhinitis   . Diabetes mellitus   . COPD (chronic obstructive pulmonary disease)   . DVT (deep venous thrombosis)   . Syncope     [employer] loop recorder implantation 03/2009  . LV dysfunction     Mild, EF 45-50% by echocardiogram 02/2009  . Shortness of breath   . Asthma   . Hypertension   . Headache   . Anxiety   . Chronic cough   . Sinusitis   . Skin melanoma     "don't remember where"  . Supraventricular tachycardia     s/p RF ablation in 2000  . A-fib   . Blood transfusion   . Anemia   . H/O hiatal hernia   . Arthritis   . Angina     chronic chest pain    Medications:  Prescriptions prior to admission  Medication Sig Dispense Refill  . budesonide (PULMICORT) 0.25 MG/2ML nebulizer solution Take 0.25 mg by nebulization 2 (two) times daily.      . Cholecalciferol (VITAMIN D3) 50000 UNITS CAPS Take 50,000 Units by mouth once a week. On Sundays      .  digoxin (LANOXIN) 0.125 MG tablet Take 1 tablet by mouth daily.      Marland Kitchen diltiazem (CARDIZEM CD) 180 MG 24 hr capsule Take 1 capsule (180 mg total) by mouth daily.  30 capsule  6  . formoterol (PERFOROMIST) 20 MCG/2ML nebulizer solution Take 20 mcg by nebulization 2 (two) times daily as needed. Shortness of breath      . ibuprofen (ADVIL,MOTRIN) 200 MG tablet Take 200-400 mg by mouth every 6 (six) hours as needed. For pain      . losartan (COZAAR) 50 MG tablet Take 50 mg by mouth daily.      . metoprolol tartrate (LOPRESSOR) 25 MG tablet 1/2 TABLET BY MOUTH TWO TIMES A DAY  30 tablet  7  . montelukast (SINGULAIR) 10 MG tablet Take 1 tablet by mouth daily.      . pravastatin (PRAVACHOL) 40 MG tablet Take 40 mg by mouth daily.        Marland Kitchen PROAIR HFA 108 (90 BASE) MCG/ACT inhaler INHALE 2 PUFFS 4 TIMES A DAY AS NEEDED  1 Inhaler  0  . promethazine-codeine (PHENERGAN WITH CODEINE) 6.25-10 MG/5ML syrup Take 5 mLs by mouth every 4 (four) hours as needed. For cough        . tiotropium (SPIRIVA) 18 MCG inhalation capsule Place 18 mcg into inhaler and inhale at bedtime.        Insulin Requirements in the past 24 hours:  none  Current Nutrition:  Currently NPO.  Underweight.  Albumin today 2.2  Assessment: 78 yo lady s/p exp lap with lysis of adhesions and excision of mesenteric mass, SBO now with ileus to start TPN.  She has no h/o DM.  Baseline labs phos 2.4, mag 1.8, K 3.6, Ca 7.7, corrected Ca 9.14. Clinimix 5/20 at goal rate with lipids at 56ml/hr 3xweek will provide 72 gm protein/day and and average of 1432 Kcal/day.    Nutritional Goals:  1400-1600 kCal, 65-75 grams of protein per day  Plan:  Start Clinimix E 5/20 at 40 ml/hr Lipids, MVI and trace elements on MWF due to national backorder. Will add ssi to ensure cbgs are controlled. Decrease maintenance IV fluids by same amt as TPN increased. F/u am labs.  Thanks for allowing pharmacy to be a part of this patient's care.  Talbert Cage, PharmD Clinical Pharmacist, [phone]  09/01/2011,8:03 AM

## 2011-09-01 NOTE — Progress Notes (Signed)
Patient had a short burst of atrial fib around 0230. HR up to 120 and came down into 80s immediately. A few minutes later she converted out to sinus rhythm with a rate in 80s with frequent PACs and PVCs. Otherwise asymptomatic. [name] Ross RN

## 2011-09-01 NOTE — Progress Notes (Signed)
Nutrition Consult/Follow-up  Intervention:    TPN per pharmacy RD to follow for nutrition care plan  Assessment:   RD consult for new TPN. S/p exploratory laparotomy with lysis of adhesions 7/17. Transferred to 2300. Patient now with pos-op ileus. No flatus or BM yet. PICC line in place.  Patient is receiving TPN with Clinimix E 5/15 @ 40 ml/hr.  Lipids (20% IVFE @ 6 ml/hr), multivitamins, and trace elements are provided 3 times weekly (MWF) due to national backorder.  Provides 805 kcal and 48 grams protein daily (based on weekly average).  Meets 57% minimum estimated kcal and 74% minimum estimated protein needs.  Diet Order:  NPO  Meds: Scheduled Meds:   . ampicillin-sulbactam (UNASYN) IV  3 g Intravenous Q6H  . antiseptic oral rinse  15 mL Mouth Rinse q12n4p  . chlorhexidine  15 mL Mouth Rinse BID  . Chlorhexidine Gluconate Cloth  6 each Topical Q0600  . digoxin  0.125 mg Intravenous Daily  . enoxaparin (LOVENOX) injection  30 mg Subcutaneous Q24H  . insulin aspart  0-9 Units Subcutaneous Q4H  . morphine   Intravenous Q4H  . mupirocin ointment  1 application Nasal BID  . pantoprazole (PROTONIX) IV  40 mg Intravenous QHS  . sodium chloride  10-40 mL Intracatheter Q12H  . tiotropium  18 mcg Inhalation Daily   Continuous Infusions:   . sodium chloride 100 mL/hr (08/31/11 2151)  . dextrose    . dextrose    . dextrose    . TPN (CLINIMIX) +/- additives     And  . fat emulsion     PRN Meds:.acetaminophen, acetaminophen, dextrose, diphenhydrAMINE, levalbuterol, LORazepam, metoprolol, naloxone, ondansetron (ZOFRAN) IV, phenol, sodium chloride, sodium chloride, DISCONTD: diphenhydrAMINE, DISCONTD:  HYDROmorphone (DILAUDID) injection, DISCONTD: metoprolol  Labs:  CMP     Component Value Date/Time   NA 139 09/01/2011 0300   K 3.6 09/01/2011 0300   CL 106 09/01/2011 0300   CO2 22 09/01/2011 0300   GLUCOSE 112* 09/01/2011 0300   BUN 13 09/01/2011 0300   CREATININE 0.56 09/01/2011 0300     CALCIUM 7.7* 09/01/2011 0300   PROT 4.6* 09/01/2011 0300   ALBUMIN 2.2* 09/01/2011 0300   AST 22 09/01/2011 0300   ALT 11 09/01/2011 0300   ALKPHOS 43 09/01/2011 0300   BILITOT 0.2* 09/01/2011 0300   GFRNONAA 87* 09/01/2011 0300   GFRAA >90 09/01/2011 0300     Intake/Output Summary (Last 24 hours) at 09/01/11 0930 Last data filed at 09/01/11 0800  Gross per 24 hour  Intake   2351 ml  Output   1070 ml  Net   1281 ml    CBG (last 3)   Basename 09/01/11 0359 08/31/11 2343 08/31/11 1922  GLUCAP 97 112* 117*    Weight Status:  46.4 kg (7/19) -- trending up  Re-estimated needs:  1400-1600 kcals, 65-75 gm protein  Nutrition Dx:  Inadequate Oral Intake now r/t altered GI function as evidenced by NPO status, ongoing  Goal:  TPN to meet >90% of estimated protein needs, maximize energy provision as able during national lipid backorder, progressing  Monitor:  TPN prescription, PO diet advancement, weight, labs, I/O's  Kirkland Hun, RD, LDN Pager #: [phone] After-Hours Pager #: [phone]

## 2011-09-02 DIAGNOSIS — I10 Essential (primary) hypertension: Secondary | ICD-10-CM

## 2011-09-02 DIAGNOSIS — K56609 Unspecified intestinal obstruction, unspecified as to partial versus complete obstruction: Secondary | ICD-10-CM

## 2011-09-02 DIAGNOSIS — E119 Type 2 diabetes mellitus without complications: Secondary | ICD-10-CM

## 2011-09-02 LAB — GLUCOSE, CAPILLARY
Glucose-Capillary: 214 mg/dL — ABNORMAL HIGH (ref 70–99)
Glucose-Capillary: 192 mg/dL — ABNORMAL HIGH (ref 70–99)
Glucose-Capillary: 154 mg/dL — ABNORMAL HIGH (ref 70–99)

## 2011-09-02 LAB — BASIC METABOLIC PANEL
Calcium: 8.2 mg/dL — ABNORMAL LOW (ref 8.4–10.5)
Creatinine, Ser: 0.45 mg/dL — ABNORMAL LOW (ref 0.50–1.10)
GFR calc Af Amer: >90 mL/min (ref >90)
GFR calc non Af Amer: >90 mL/min (ref >90)

## 2011-09-02 LAB — PHOSPHORUS: Phosphorus: 1.4 mg/dL — ABNORMAL LOW (ref 2.3–4.6)

## 2011-09-02 MED ORDER — POTASSIUM CHLORIDE 10 MEQ/50ML IV SOLN
INTRAVENOUS | Status: AC
  Administered 2011-09-02: 10 meq
  Filled 2011-09-02: qty 150

## 2011-09-02 MED ORDER — SODIUM CHLORIDE 0.9 % IJ SOLN
INTRAMUSCULAR | Status: AC
  Filled 2011-09-02: qty 10

## 2011-09-02 MED ORDER — MAGNESIUM SULFATE 40 MG/ML IJ SOLN
2.0000 g | Freq: Once | INTRAMUSCULAR | Status: AC
  Administered 2011-09-02: 2 g via INTRAVENOUS
  Filled 2011-09-02: qty 50

## 2011-09-02 MED ORDER — POTASSIUM CHLORIDE 10 MEQ/100ML IV SOLN: 10.0000 meq | INTRAVENOUS | Status: DC

## 2011-09-02 MED ORDER — SODIUM PHOSPHATE 3 MMOLE/ML IV SOLN
20.0000 mmol | Freq: Once | INTRAVENOUS | Status: AC
  Administered 2011-09-02: 20 mmol via INTRAVENOUS
  Filled 2011-09-02: qty 6.67

## 2011-09-02 MED ORDER — POTASSIUM CHLORIDE 10 MEQ/50ML IV SOLN
10.0000 meq | INTRAVENOUS | Status: AC
  Administered 2011-09-02 (×2): 10 meq via INTRAVENOUS

## 2011-09-02 MED ORDER — POTASSIUM CHLORIDE 10 MEQ/50ML IV SOLN
10.0000 meq | INTRAVENOUS | Status: AC
  Administered 2011-09-02 (×2): 10 meq via INTRAVENOUS
  Filled 2011-09-02: qty 150

## 2011-09-02 MED ORDER — SODIUM CHLORIDE 0.9 % IJ SOLN
INTRAMUSCULAR | Status: AC
  Administered 2011-09-02: 10 mL via INTRAVENOUS
  Filled 2011-09-02: qty 10

## 2011-09-02 MED ORDER — INSULIN REGULAR HUMAN 100 UNIT/ML IJ SOLN
INTRAVENOUS | Status: AC
  Administered 2011-09-02: 17:00:00 via INTRAVENOUS
  Filled 2011-09-02: qty 2000

## 2011-09-02 NOTE — Progress Notes (Signed)
PARENTERAL NUTRITION CONSULT NOTE - FOLLOW UP  Pharmacy Consult for TPN Indication: Prolonged Ileus  Allergies  Allergen Reactions  . Epinephrine Other (See Comments)    Patient thinks her heart stopped and the MD said never to take this again.  Berle Mull Dye (Iodinated Diagnostic Agents) Other (See Comments)    Unknown-patient said she thinks her heart stopped, but she really can't remember  . Albuterol Sulfate Nausea And Vomiting    Pt reports she is able to tolerate the albuterol hfa, not the nebulizer solution.  Jonne Ply (Aspirin) Other (See Comments)    Heart doctor told her to take advil instead    Patient Measurements: Height: 5\' 2"  (157.5 cm) Weight: 102 lb 8 oz (46.494 kg) IBW/kg (Calculated) : 50.1   Vital Signs: Temp: 98.8 F (37.1 C) (07/20 0700) Temp src: Oral (07/20 0700) BP: 112/55 mmHg (07/20 0800) Pulse Rate: 83  (07/20 0350) Intake/Output from previous day: 07/19 0701 - 07/20 0700 In: 2584 [I.V.:1886; NG/GT:90; IV Piggyback:10; TPN:598] Out: 1065 [Urine:965; Emesis/NG output:100] Intake/Output from this shift: Total I/O In: 318 [I.V.:180; TPN:138] Out: -   Labs:  Basename 09/01/11 0300 08/31/11 0410  WBC 2.5* 2.3*  HGB 9.9* 11.4*  HCT 29.9* 33.6*  PLT 160 115*  APTT -- --  INR -- --     Basename 09/02/11 0500 09/01/11 0300 08/31/11 0410  NA 139 139 136  K 3.3* 3.6 4.9  CL 104 106 102  CO2 27 22 21   GLUCOSE 212* 112* 148*  BUN 14 13 14   CREATININE 0.45* 0.56 0.49*  LABCREA -- -- --  CREAT24HRUR -- -- --  CALCIUM 8.2* 7.7* 7.9*  MG 1.8 1.8 --  PHOS 1.4* 2.4 --  PROT -- 4.6* --  ALBUMIN -- 2.2* --  AST -- 22 --  ALT -- 11 --  ALKPHOS -- 43 --  BILITOT -- 0.2* --  BILIDIR -- -- --  IBILI -- -- --  PREALBUMIN -- 9.8* --  TRIG -- 55 --  CHOLHDL -- -- --  CHOL -- 94 --   Estimated Creatinine Clearance: 42.5 ml/min (by C-G formula based on Cr of 0.45).    Basename 09/02/11 0944 09/02/11 0351 09/01/11 2324  GLUCAP 192* 214* 171*     Insulin Requirements in the past 24 hours:  9 units  Current Nutrition:  NPO, TPN at ~50% goal  Assessment: TPN continues for slowly resolving ileus 3 days s/p ex lap with LOA for SBO.  GI: Noted NGT output decreased, plans to clamp and likely d/c tomorrow. Continues to be NPO for now. Endo: Hx of DM, currently on SSI. CBG uncontrolled for goal < 110. Lytes: K low- noted MD repleted with 3 runs, Phos low and Mg below goal of 2 Renal: Scr stable, UOP ok Pulm: 1L [place] Cards: VSS, noted cont on home dig Hepatobil: LFTs WNL, prealbumin low for goal 18-40 ID: Afeb, WBC low   Best Practices: LMWH, IV PPI  Nutritional Goals:  1400-1600 kCal, 65-75 grams of protein per day  Plan:  - Continue SSI; will add 10 units of insulin to TPN bag today - Will change Clinimix to E 5/15, advance rate to 50cc/hr tonight- will advance to eventual goal rate of 60cc/hr (to provide 72gm protein and avg of 1250 Kcal per day - Will give 3 more runs of KCL - Will give of NaPhos  - Will give 2gm of IV Mg - Will add Lipids and MVI/Trace elements MWF only d/t national  shortage  [name], PharmD, BCPS.  Clinical Pharmacist Pager [phone]. 09/02/2011 1:43 PM

## 2011-09-02 NOTE — Progress Notes (Signed)
3 Days Post-Op  Subjective: No flatus, pain controlled  Objective: Vital signs in last 24 hours: Temp:  [97.9 F (36.6 C)-98.9 F (37.2 C)] 98.8 F (37.1 C) (07/20 0700) Pulse Rate:  [63-83] 83  (07/20 0350) Resp:  [11-23] 20  (07/20 0800) BP: (109-139)/(48-82) 112/55 mmHg (07/20 0800) SpO2:  [92 %-100 %] 99 % (07/20 1022) FiO2 (%):  [2 %] 2 % (07/20 0800) Last BM Date: 08/25/11  Intake/Output from previous day: 07/19 0701 - 07/20 0700 In: 2584 [I.V.:1886; NG/GT:90; IV Piggyback:10; TPN:598] Out: 1065 [Urine:965; Emesis/NG output:100] Intake/Output this shift:    General appearance: no distress GI: approp tender wound clean, some bs present  Lab Results:   Basename 09/01/11 0300 08/31/11 0410  WBC 2.5* 2.3*  HGB 9.9* 11.4*  HCT 29.9* 33.6*  PLT 160 115*   BMET  Basename 09/02/11 0500 09/01/11 0300  NA 139 139  K 3.3* 3.6  CL 104 106  CO2 27 22  GLUCOSE 212* 112*  BUN 14 13  CREATININE 0.45* 0.56  CALCIUM 8.2* 7.7*    Assessment/Plan: POD 3 ex lap loa  1. pca  2. pulm toilet/oob  3. Appreciate trh care  4. TNA, she has some bowel sounds so will clamp ng today, possibly out tomorrow, leave npo 5. Lovenox, scds  6. Replace k, recheck in am    LOS: 5 days    [facility] 09/02/2011

## 2011-09-02 NOTE — Progress Notes (Signed)
PATIENT DETAILS Name: [name] Age: 78 y.o. Sex: female Date of Birth: 09-12-1931 Admit Date: 08/28/2011 JXB:JYNW, CAMMIE, MD  Subjective: Less abd pain today.   No new symptoms.   Objective: Vital signs in last 24 hours: Filed Vitals:   09/02/11 1525 09/02/11 1536 09/02/11 1607 09/02/11 1900  BP: 131/65   128/100  Pulse: 66   81  Temp:  98.1 F (36.7 C)  98.4 F (36.9 C)  TempSrc:  Oral  Oral  Resp: 22  18 21   Height:      Weight:      SpO2: 100%  98% 97%    Assessment/Plan: Principal Problem:  *SBO (small bowel obstruction) -S/P Laparotomy with Lysis of adhesion on 7/17 -op note-reveiwed -post-op care per [facility] -PICC Line and TNA started - bowel sounds noted - will let surgery manage-  NG clamped   HTN (hypertension) -Home BP meds including metoprolol on hold as NPO and BP low/controlled without it PRN Metoprolol ordered but she has not needed it due to controlled BP.    Diabetes mellitus -complicated by hypoglycemia on 7/17-CBG's stable since then -A1C -6.2 -monitor CBG's, and re-institute SSI as needed   Hyponatremia -resolved with IVF-likely pre-renal from Nausea and vomiting   Prerenal acute renal failure -resolved with IVF   Anxiety -currently seems stable -institute as needed Benzo's when needed  Paroxysmal A-fib -Sinus rhythm with PAC's on telemetry -resume cardizem when appropriate-currently on hold as NPO -c/w Digoxin IV - PRN metoprolol ordered for HTN or rapid HR but not needed  COPD -stable-no signs of acute exacerbation-lungs are clear to exam -c/w spiriva -as needed Xopenex  Disposition: -remain in SDU  DVT Prophylaxis: prophylactic Lovenox  Code Status: Full Code   Intake/Output from previous day:  Intake/Output Summary (Last 24 hours) at 09/02/11 2117 Last data filed at 09/02/11 2000  Gross per 24 hour  Intake   2241 ml  Output   1550 ml  Net    691 ml    PHYSICAL EXAM: Weight change:  Body mass index is  18.75 kg/(m^2).  Gen Exam: Awake and alert with clear speech.  Neck: Supple, No JVD.   Chest: B/L Clear.   CVS: S1 S2 Regular, no murmurs.  Abdomen: soft, BS positive, diffusely tender  Extremities: no edema, lower extremities warm to touch. Neurologic: Non Focal.   Skin: No Rash.   Wounds: N/A.   CONSULTS:  general surgery  LAB RESULTS: CBC  Lab 09/01/11 0300 08/31/11 0410 08/30/11 0604 08/29/11 1034 08/29/11 0538 08/28/11 1344  WBC 2.5* 2.3* 5.2 3.6* 3.7* --  HGB 9.9* 11.4* 12.7 12.0 12.4 --  HCT 29.9* 33.6* 38.0 35.9* 37.1 --  PLT 160 115* 192 182 192 --  MCV 94.3 92.6 95.0 93.5 93.2 --  MCH 31.2 31.4 31.8 31.3 31.2 --  MCHC 33.1 33.9 33.4 33.4 33.4 --  RDW 14.2 14.1 14.4 14.7 14.6 --  LYMPHSABS 0.7 -- -- 1.0 1.0 1.0  MONOABS 0.3 -- -- 0.4 0.4 0.4  EOSABS 0.0 -- -- 0.0 0.0 0.0  BASOSABS 0.0 -- -- 0.0 0.0 0.0  BANDABS -- -- -- -- -- --    Chemistries   Lab 09/02/11 0500 09/01/11 0300 08/31/11 0410 08/30/11 0604 08/29/11 0538  NA 139 139 136 136 135  K 3.3* 3.6 4.9 3.9 5.0  CL 104 106 102 101 100  CO2 27 22 21 22 25   GLUCOSE 212* 112* 148* 77 103*  BUN 14 13 14 21  26*  CREATININE 0.45* 0.56 0.49* 0.63 0.85  CALCIUM 8.2* 7.7* 7.9* 8.7 8.6  MG 1.8 1.8 -- -- --    CBG:  Lab 09/02/11 1946 09/02/11 1707 09/02/11 1148 09/02/11 0944 09/02/11 0351  GLUCAP 154* 167* 210* 192* 214*    GFR Estimated Creatinine Clearance: 42.5 ml/min (by C-G formula based on Cr of 0.45).  Coagulation profile No results found for this basename: INR:5,PROTIME:5 in the last 168 hours  Cardiac Enzymes No results found for this basename: CK:3,CKMB:3,TROPONINI:3,MYOGLOBIN:3 in the last 168 hours  No components found with this basename: POCBNP:3 No results found for this basename: DDIMER:2 in the last 72 hours No results found for this basename: HGBA1C:2 in the last 72 hours  Basename 09/01/11 0300  CHOL 94  HDL --  LDLCALC --  TRIG 55  CHOLHDL --  LDLDIRECT --   No results  found for this basename: TSH,T4TOTAL,FREET3,T3FREE,THYROIDAB in the last 72 hours No results found for this basename: VITAMINB12:2,FOLATE:2,FERRITIN:2,TIBC:2,IRON:2,RETICCTPCT:2 in the last 72 hours No results found for this basename: LIPASE:2,AMYLASE:2 in the last 72 hours  Urine Studies No results found for this basename: UACOL:2,UAPR:2,USPG:2,UPH:2,UTP:2,UGL:2,UKET:2,UBIL:2,UHGB:2,UNIT:2,UROB:2,ULEU:2,UEPI:2,UWBC:2,URBC:2,UBAC:2,CAST:2,CRYS:2,UCOM:2,BILUA:2 in the last 72 hours  MICROBIOLOGY: Recent Results (from the past 240 hour(s))  MRSA PCR SCREENING     Status: Abnormal   Collection Time   08/30/11 12:55 PM      Component Value Range Status Comment   MRSA by PCR POSITIVE (*) NEGATIVE Final     RADIOLOGY STUDIES/RESULTS: Dg Abd Acute W/chest  08/28/2011  *RADIOLOGY REPORT*  Clinical Data: Mid abdominal pain, nausea, abdominal distention  ACUTE ABDOMEN SERIES (ABDOMEN 2 VIEW & CHEST 1 VIEW)  Comparison: 02/24/2011  Findings: Loop recorder projects over left chest. Normal heart size, mediastinal contours, and pulmonary vascularity. Emphysematous changes with chronic peribronchial thickening right base scarring. No pulmonary infiltrate, pleural effusion or pneumothorax. Diffuse osseous demineralization. Small amount gas and stool within colon. Air filled loops of minimally distended small bowel with air-fluid levels on upright view new since previous exam question small bowel obstruction. No definite bowel wall thickening or free intraperitoneal air. Pelvic phleboliths and a few scattered vascular calcifications. Degenerative changes left hip. No urinary tract calcification.  IMPRESSION: Air filled mildly distended small bowel loops with air-fluid levels on upright view new versus previous exam question small bowel obstruction. COPD.  Original Report Authenticated By: Lollie Marrow, M.D.   Dg Abd Portable 1v  08/29/2011  *RADIOLOGY REPORT*  Clinical Data: Ileus and evaluate NG tube placement.   PORTABLE ABDOMEN - 1 VIEW  Comparison: 08/28/2011  Findings: Nasogastric tube terminates at the body of the stomach. Proximal mid small bowel loops remain dilated.  Up to 3.5 cm. Relative paucity of distal gas.  There  is a moderate amount of ascending colonic stool. No pneumatosis or free intraperitoneal air.  Probable rectal gas.  Phleboliths in the pelvis.  IMPRESSION:  1.  Nasogastric terminating at body of stomach. 2.  Similar small bowel dilatation, suspicious for partial small bowel obstruction.  Localized adynamic ileus could look similar.  Original Report Authenticated By: Consuello Bossier, M.D.    MEDICATIONS: Scheduled Meds:    . antiseptic oral rinse  15 mL Mouth Rinse q12n4p  . chlorhexidine  15 mL Mouth Rinse BID  . Chlorhexidine Gluconate Cloth  6 each Topical Q0600  . digoxin  0.125 mg Intravenous Daily  . enoxaparin (LOVENOX) injection  30 mg Subcutaneous Q24H  . insulin aspart  0-9 Units Subcutaneous Q4H  . magnesium sulfate 1 - 4  g bolus IVPB  2 g Intravenous Once  . morphine   Intravenous Q4H  . mupirocin ointment  1 application Nasal BID  . pantoprazole (PROTONIX) IV  40 mg Intravenous QHS  . potassium chloride  10 mEq Intravenous Q1 Hr x 2  . potassium chloride  10 mEq Intravenous Q1 Hr x 3  . potassium chloride      . sodium chloride  10-40 mL Intracatheter Q12H  . sodium chloride      . sodium chloride      . sodium phosphate  Dextrose 5% IVPB  20 mmol Intravenous Once  . tiotropium  18 mcg Inhalation Daily  . DISCONTD: potassium chloride  10 mEq Intravenous Q1 Hr x 3   Continuous Infusions:    . sodium chloride 60 mL/hr at 09/01/11 2101  . dextrose    . dextrose    . dextrose    . TPN (CLINIMIX) +/- additives 40 mL/hr at 09/01/11 1711   And  . fat emulsion 250 mL (09/01/11 1711)  . TPN (CLINIMIX) +/- additives 50 mL/hr at 09/02/11 1728   PRN Meds:.acetaminophen, acetaminophen, dextrose, diphenhydrAMINE, levalbuterol, LORazepam, metoprolol, naloxone,  ondansetron (ZOFRAN) IV, phenol, sodium chloride, sodium chloride, DISCONTD: metoprolol  Antibiotics: Anti-infectives     Start     Dose/Rate Route Frequency Ordered Stop   08/30/11 2000   Ampicillin-Sulbactam (UNASYN) 3 g in sodium chloride 0.9 % 100 mL IVPB        3 g 100 mL/hr over 60 Minutes Intravenous Every 6 hours 08/30/11 1920 08/31/11 1517   08/30/11 1500   Ampicillin-Sulbactam (UNASYN) 3 g in sodium chloride 0.9 % 100 mL IVPB        3 g 100 mL/hr over 60 Minutes Intravenous  Once 08/30/11 1439 08/30/11 1500          Makena Mcgrady, MD  [facility] Pager [phone]  If 7PM-7AM, please contact night-coverage [email or web address] Password [email or web address] 09/02/2011, 9:17 PM   LOS: 5 days

## 2011-09-03 LAB — BASIC METABOLIC PANEL
BUN: 11 mg/dL (ref 6–23)
Chloride: 101 mEq/L (ref 96–112)
GFR calc Af Amer: >90 mL/min (ref >90)
Glucose, Bld: 180 mg/dL — ABNORMAL HIGH (ref 70–99)
Potassium: 3.8 mEq/L (ref 3.5–5.1)
Sodium: 137 mEq/L (ref 135–145)

## 2011-09-03 LAB — GLUCOSE, CAPILLARY: Glucose-Capillary: 133 mg/dL — ABNORMAL HIGH (ref 70–99)

## 2011-09-03 LAB — MAGNESIUM: Magnesium: 2 mg/dL (ref 1.5–2.5)

## 2011-09-03 LAB — PHOSPHORUS: Phosphorus: 2.3 mg/dL (ref 2.3–4.6)

## 2011-09-03 MED ORDER — SODIUM PHOSPHATE 3 MMOLE/ML IV SOLN
15.0000 mmol | Freq: Once | INTRAVENOUS | Status: AC
  Administered 2011-09-03: 15 mmol via INTRAVENOUS
  Filled 2011-09-03: qty 5

## 2011-09-03 MED ORDER — INSULIN REGULAR HUMAN 100 UNIT/ML IJ SOLN
INTRAVENOUS | Status: DC
  Filled 2011-09-03: qty 2000

## 2011-09-03 MED ORDER — POTASSIUM CHLORIDE 10 MEQ/50ML IV SOLN
10.0000 meq | INTRAVENOUS | Status: AC
  Administered 2011-09-03 (×3): 10 meq via INTRAVENOUS
  Filled 2011-09-03: qty 150

## 2011-09-03 NOTE — Progress Notes (Signed)
4 Days Post-Op  Subjective: Complains of soreness and that her room is cold  Objective: Vital signs in last 24 hours: Temp:  [98.1 F (36.7 C)-98.6 F (37 C)] 98.2 F (36.8 C) (07/21 0735) Pulse Rate:  [66-85] 82  (07/21 0735) Resp:  [16-22] 20  (07/21 0735) BP: (115-154)/(56-100) 143/83 mmHg (07/21 0735) SpO2:  [96 %-100 %] 97 % (07/21 0735) Last BM Date: 08/25/11  Intake/Output from previous day: 07/20 0701 - 07/21 0700 In: 1727 [P.O.:6; I.V.:180; IV Piggyback:379; TPN:1162] Out: 2350 [Urine:2300; Emesis/NG output:50] Intake/Output this shift: Total I/O In: 386 [IV Piggyback:236; TPN:150] Out: 400 [Urine:400]  GI: soft, tender. no flatus yet  Lab Results:   Basename 09/01/11 0300  WBC 2.5*  HGB 9.9*  HCT 29.9*  PLT 160   BMET  Basename 09/03/11 0406 09/02/11 0500  NA 137 139  K 3.8 3.3*  CL 101 104  CO2 29 27  GLUCOSE 180* 212*  BUN 11 14  CREATININE 0.39* 0.45*  CALCIUM 8.0* 8.2*   PT/INR No results found for this basename: LABPROT:2,INR:2 in the last 72 hours ABG No results found for this basename: PHART:2,PCO2:2,PO2:2,HCO3:2 in the last 72 hours  Studies/Results: No results found.  Anti-infectives: Anti-infectives     Start     Dose/Rate Route Frequency Ordered Stop   08/30/11 2000   Ampicillin-Sulbactam (UNASYN) 3 g in sodium chloride 0.9 % 100 mL IVPB        3 g 100 mL/hr over 60 Minutes Intravenous Every 6 hours 08/30/11 1920 08/31/11 1517   08/30/11 1500   Ampicillin-Sulbactam (UNASYN) 3 g in sodium chloride 0.9 % 100 mL IVPB        3 g 100 mL/hr over 60 Minutes Intravenous  Once 08/30/11 1439 08/30/11 1500          Assessment/Plan: s/p Procedure(s) (LRB): EXPLORATORY LAPAROTOMY (N/A) leave ng until she passes flatus OOB  LOS: 6 days    TOTH III,Darcel Zick S 09/03/2011

## 2011-09-03 NOTE — Progress Notes (Signed)
PARENTERAL NUTRITION CONSULT NOTE - FOLLOW UP  Pharmacy Consult for TPN Indication: Prolonged Ileus  Allergies  Allergen Reactions  . Epinephrine Other (See Comments)    Patient thinks her heart stopped and the MD said never to take this again.  Berle Mull Dye (Iodinated Diagnostic Agents) Other (See Comments)    Unknown-patient said she thinks her heart stopped, but she really can't remember  . Albuterol Sulfate Nausea And Vomiting    Pt reports she is able to tolerate the albuterol hfa, not the nebulizer solution.  Jonne Ply (Aspirin) Other (See Comments)    Heart doctor told her to take advil instead    Patient Measurements: Height: 5\' 2"  (157.5 cm) Weight: 102 lb 8 oz (46.494 kg) IBW/kg (Calculated) : 50.1   Vital Signs: Temp: 98.6 F (37 C) (07/21 0300) Temp src: Oral (07/21 0300) BP: 148/75 mmHg (07/21 0300) Pulse Rate: 79  (07/21 0300) Intake/Output from previous day: 07/20 0701 - 07/21 0700 In: 1727 [P.O.:6; I.V.:180; IV Piggyback:379; TPN:1162] Out: 2350 [Urine:2300; Emesis/NG output:50] Intake/Output from this shift:    Labs:  Basename 09/01/11 0300  WBC 2.5*  HGB 9.9*  HCT 29.9*  PLT 160  APTT --  INR --     Basename 09/03/11 0406 09/02/11 0500 09/01/11 0300  NA 137 139 139  K 3.8 3.3* 3.6  CL 101 104 106  CO2 29 27 22   GLUCOSE 180* 212* 112*  BUN 11 14 13   CREATININE 0.39* 0.45* 0.56  LABCREA -- -- --  CREAT24HRUR -- -- --  CALCIUM 8.0* 8.2* 7.7*  MG 2.0 1.8 1.8  PHOS 2.3 1.4* 2.4  PROT -- -- 4.6*  ALBUMIN -- -- 2.2*  AST -- -- 22  ALT -- -- 11  ALKPHOS -- -- 43  BILITOT -- -- 0.2*  BILIDIR -- -- --  IBILI -- -- --  PREALBUMIN -- -- 9.8*  TRIG -- -- 55  CHOLHDL -- -- --  CHOL -- -- 94   Estimated Creatinine Clearance: 42.5 ml/min (by C-G formula based on Cr of 0.39).    Basename 09/03/11 0348 09/02/11 2344 09/02/11 1946  GLUCAP 183* 133* 154*    Insulin Requirements in the past 24 hours:  6 units  Current Nutrition:  NPO, TPN at  ~75% goal  Assessment: TPN continues for slowly resolving ileus 3 days s/p ex lap with LOA for SBO.  GI: Noted NGT clamped- tolerated without n/v per RN report, continues to be NPO for now. Endo: Hx of DM, currently on SSI, insulin added to TPN bag 7/20. CBG control better, but desire tighter control post-op. LytesJudieth Keens are largely WNL s/p repletion 7/20. In setting of ileus, would target K ~4, Mg ~2 (at goal) and Phos ~3. Renal: Scr stable, UOP good Pulm: 1L [place] Cards: VSS, noted cont on home dig. Isolated BP elevations. Hepatobil: LFTs WNL, prealbumin low for goal 18-40. Trig and Chol WNL. ID: Afeb, WBC low   Best Practices: LMWH, IV PPI  Nutritional Goals:  1400-1600 kCal, 65-75 grams of protein per day  Plan:  - Continue SSI; will increase insulin to 18 units with TPN bag today - Cotninue Clinimix to E 5/15, advance rate to goal- 60cc/hr tonight (to provide 72gm protein and avg of 1250 Kcal per day) - Will give 3 runs of KCL today - Will give 15 mmol of NaPhos today - Will add Lipids and MVI/Trace elements MWF only d/t national shortage - Will f/up routine TPN labs in AM  Sylver Vantassell K. Allena Katz, PharmD, BCPS.  Clinical Pharmacist Pager [phone]. 09/03/2011 7:20 AM

## 2011-09-04 ENCOUNTER — Inpatient Hospital Stay (HOSPITAL_COMMUNITY): Payer: Medicare Other

## 2011-09-04 DIAGNOSIS — J449 Chronic obstructive pulmonary disease, unspecified: Secondary | ICD-10-CM

## 2011-09-04 LAB — CBC
HCT: 32.4 % — ABNORMAL LOW (ref 36.0–46.0)
Hemoglobin: 10.9 g/dL — ABNORMAL LOW (ref 12.0–15.0)
MCV: 93.6 fL (ref 78.0–100.0)
RBC: 3.46 MIL/uL — ABNORMAL LOW (ref 3.87–5.11)
WBC: 3.2 10*3/uL — ABNORMAL LOW (ref 4.0–10.5)

## 2011-09-04 LAB — COMPREHENSIVE METABOLIC PANEL
ALT: 9 U/L (ref 0–35)
AST: 13 U/L (ref 0–37)
Albumin: 2.2 g/dL — ABNORMAL LOW (ref 3.5–5.2)
CO2: 30 mEq/L (ref 19–32)
Calcium: 8.4 mg/dL (ref 8.4–10.5)
GFR calc non Af Amer: >90 mL/min (ref >90)
Sodium: 134 mEq/L — ABNORMAL LOW (ref 135–145)
Total Protein: 4.9 g/dL — ABNORMAL LOW (ref 6.0–8.3)

## 2011-09-04 LAB — GLUCOSE, CAPILLARY
Glucose-Capillary: 139 mg/dL — ABNORMAL HIGH (ref 70–99)
Glucose-Capillary: 162 mg/dL — ABNORMAL HIGH (ref 70–99)
Glucose-Capillary: 172 mg/dL — ABNORMAL HIGH (ref 70–99)
Glucose-Capillary: 121 mg/dL — ABNORMAL HIGH (ref 70–99)
Glucose-Capillary: 135 mg/dL — ABNORMAL HIGH (ref 70–99)

## 2011-09-04 LAB — DIFFERENTIAL
Eosinophils Relative: 0 % (ref 0–5)
Lymphocytes Relative: 32 % (ref 12–46)
Lymphs Abs: 1 10*3/uL (ref 0.7–4.0)
Monocytes Absolute: 0.4 10*3/uL (ref 0.1–1.0)
Monocytes Relative: 13 % — ABNORMAL HIGH (ref 3–12)

## 2011-09-04 MED ORDER — METOPROLOL TARTRATE 1 MG/ML IV SOLN
5.0000 mg | Freq: Four times a day (QID) | INTRAVENOUS | Status: DC
  Administered 2011-09-04 – 2011-09-07 (×12): 5 mg via INTRAVENOUS
  Filled 2011-09-04 (×16): qty 5

## 2011-09-04 MED ORDER — ZINC TRACE METAL 1 MG/ML IV SOLN
INTRAVENOUS | Status: AC
  Administered 2011-09-04: 18:00:00 via INTRAVENOUS
  Filled 2011-09-04: qty 2000

## 2011-09-04 MED ORDER — FAT EMULSION 20 % IV EMUL
192.0000 mL | INTRAVENOUS | Status: AC
  Administered 2011-09-04: 192 mL via INTRAVENOUS
  Filled 2011-09-04: qty 200

## 2011-09-04 NOTE — Progress Notes (Signed)
Clinical Social Work Department BRIEF PSYCHOSOCIAL ASSESSMENT 09/04/2011  Patient:  [name], [name]     Account Number:  [id number]     Admit date:  08/28/2011  Clinical Social Worker:  Dennison Bulla  Date/Time:  09/04/2011 01:00 PM  Referred by:  Physician  Date Referred:  09/04/2011 Referred for  SNF Placement   Other Referral:   Interview type:  Patient Other interview type:    PSYCHOSOCIAL DATA Living Status:  ALONE Admitted from facility:   Level of care:   Primary support name:  Richard Primary support relationship to patient:  CHILD, ADULT Degree of support available:   Strong    CURRENT CONCERNS Current Concerns  Post-Acute Placement   Other Concerns:    SOCIAL WORK ASSESSMENT / PLAN CSW received referral to assist with dc planning. CSW reviewed chart and met with patient at bedside. No visitors were present.    CSW introduced myself and explained role. CSW and patient discussed prior living arrangements. Patient reports that she lives alone but her two sons check in on her during the day and someone stays with her at night. Per patient, she was independent before dc and uses a walker. Patient reports she continues to ambulate with walker at hospital. CSW discussed SNF with patient but she was not interested. Patient reported that she will return home at dc. CSW is signing off but available if needed.   Assessment/plan status:  Psychosocial Support/Ongoing Assessment of Needs Other assessment/ plan:   Information/referral to community resources:   SNF information    PATIENT'S/FAMILY'S RESPONSE TO PLAN OF CARE: Patient was alert and oriented. Patient agreeable to CSW consult. Patient politely declined SNF and reports that she will return home with support of family.

## 2011-09-04 NOTE — Progress Notes (Signed)
5 Days Post-Op  Subjective: Resting quietly this morning, no c/o N/V no flatus as of yet, + burping.  "Tube just fell out" per patient.  Objective: Vital signs in last 24 hours: Temp:  [98.3 F (36.8 C)-98.4 F (36.9 C)] 98.4 F (36.9 C) (07/22 0700) Pulse Rate:  [75-82] 82  (07/22 0355) Resp:  [15-18] 16  (07/22 0400) BP: (134-155)/(76-93) 144/93 mmHg (07/22 0355) SpO2:  [93 %-98 %] 93 % (07/22 0400) Last BM Date: 08/25/11  Intake/Output from previous day: 07/21 0701 - 07/22 0700 In: 2748 [I.V.:1320; NG/GT:30; IV Piggyback:408; TPN:990] Out: 1685 [Urine:1650; Emesis/NG output:35] Intake/Output this shift:    General appearance: alert, cooperative, appears stated age and no distress Chest: CTA Abdomen: staples are c/d/i no ertythema or drainage seen. Still very tender to palpation, abdomen is firm, tympanic, no BS, no flatus.  Lab Results:   Basename 09/04/11 0400  WBC 3.2*  HGB 10.9*  HCT 32.4*  PLT 194   BMET  Basename 09/04/11 0400 09/03/11 0406  NA 134* 137  K 3.8 3.8  CL 98 101  CO2 30 29  GLUCOSE 161* 180*  BUN 14 11  CREATININE 0.40* 0.39*  CALCIUM 8.4 8.0*   PT/INR No results found for this basename: LABPROT:2,INR:2 in the last 72 hours ABG No results found for this basename: PHART:2,PCO2:2,PO2:2,HCO3:2 in the last 72 hours  Studies/Results: No results found.  Anti-infectives: Anti-infectives     Start     Dose/Rate Route Frequency Ordered Stop   08/30/11 2000   Ampicillin-Sulbactam (UNASYN) 3 g in sodium chloride 0.9 % 100 mL IVPB        3 g 100 mL/hr over 60 Minutes Intravenous Every 6 hours 08/30/11 1920 08/31/11 1517   08/30/11 1500   Ampicillin-Sulbactam (UNASYN) 3 g in sodium chloride 0.9 % 100 mL IVPB        3 g 100 mL/hr over 60 Minutes Intravenous  Once 08/30/11 1439 08/30/11 1500          Assessment/Plan: s/p Procedure(s) (LRB): EXPLORATORY LAPAROTOMY (N/A) 1. Remain NPO (?repeat abd films) will discuss with Dr. Luisa Hart.  2.  OOB TID 3. Medical management per medicine team  LOS: 7 days    Aleasha Fregeau 09/04/2011

## 2011-09-04 NOTE — Progress Notes (Signed)
Orthopedic Tech Progress Note Patient Details:  [name] Sep 23, 1931 [id number] Abdominal binder delivered, left with nurse Ortho Devices Type of Ortho Device: Abdominal binder Ortho Device/Splint Interventions: Ordered   [place] 09/04/2011, 11:41 AM

## 2011-09-04 NOTE — Progress Notes (Signed)
PARENTERAL NUTRITION CONSULT NOTE - FOLLOW UP  Pharmacy Consult for TPN Indication: Prolonged Ileus  Allergies  Allergen Reactions  . Epinephrine Other (See Comments)    Patient thinks her heart stopped and the MD said never to take this again.  Berle Mull Dye (Iodinated Diagnostic Agents) Other (See Comments)    Unknown-patient said she thinks her heart stopped, but she really can't remember  . Albuterol Sulfate Nausea And Vomiting    Pt reports she is able to tolerate the albuterol hfa, not the nebulizer solution.  Jonne Ply (Aspirin) Other (See Comments)    Heart doctor told her to take advil instead    Patient Measurements: Height: 5\' 2"  (157.5 cm) Weight: 102 lb 8 oz (46.494 kg) IBW/kg (Calculated) : 50.1   Vital Signs: Temp: 98.4 F (36.9 C) (07/22 0355) Temp src: Oral (07/22 0355) BP: 144/93 mmHg (07/22 0355) Pulse Rate: 82  (07/22 0355) Intake/Output from previous day: 07/21 0701 - 07/22 0700 In: 2748 [I.V.:1320; NG/GT:30; IV Piggyback:408; TPN:990] Out: 1685 [Urine:1650; Emesis/NG output:35] Intake/Output from this shift:    Labs:  Basename 09/04/11 0400  WBC 3.2*  HGB 10.9*  HCT 32.4*  PLT 194  APTT --  INR --     Basename 09/04/11 0400 09/03/11 0406 09/02/11 0500  NA 134* 137 139  K 3.8 3.8 3.3*  CL 98 101 104  CO2 30 29 27   GLUCOSE 161* 180* 212*  BUN 14 11 14   CREATININE 0.40* 0.39* 0.45*  LABCREA -- -- --  CREAT24HRUR -- -- --  CALCIUM 8.4 8.0* 8.2*  MG 1.9 2.0 1.8  PHOS 2.9 2.3 1.4*  PROT 4.9* -- --  ALBUMIN 2.2* -- --  AST 13 -- --  ALT 9 -- --  ALKPHOS 44 -- --  BILITOT 0.2* -- --  BILIDIR -- -- --  IBILI -- -- --  PREALBUMIN -- -- --  TRIG 71 -- --  CHOLHDL -- -- --  CHOL 115 -- --   Estimated Creatinine Clearance: 42.5 ml/min (by C-G formula based on Cr of 0.4).    Basename 09/04/11 0727 09/04/11 0355 09/03/11 2336  GLUCAP 142* 170* 139*    Insulin Requirements in the past 24 hours:  3 units SSI with 18 units insulin in TPN on  sensitive SSI q4h  Current Nutrition:  NPO, Clinimix E5/15 at 60 ml/hr ,Lipids MWF only at 8 mlhr (at goal)  Assessment: TPN continues for slowly resolving ileus 4 days s/p ex lap with LOA for SBO.  GI: Noted NGT fell out - no nausea noted so MD ok'd leaving tube out for now Endo: Hx of DM, currently on SSI, insulin added to TPN bag 7/20. CBG control much improved Lytes: Lytes WNL s/p repletion 7/21. In setting of ileus, target K ~4, Mg ~2 and Phos ~3. Renal: Scr stable, UOP good Pulm: RA Cards: VSS, noted cont on home dig. Isolated BP elevations. Hepatobil: LFTs WNL, prealbumin low for goal 18-40. Trig and Chol WNL. ID: Afeb, WBC low   Best Practices: LMWH, IV PPI  Nutritional Goals:  1400-1600 kCal, 65-75 grams of protein per day  Plan:  - Cotninue Clinimix to E 5/15 at goal of 60cc/hr (provides 72gm protein and avg of 1187 Kcal per day) - Continue same SSI, 18 units insulin/bag TNA - Lipids and MVI/Trace elements MWF only d/t national shortage - Will f/up BMP, cbg in AM; f/u pending prealbumin  Nakema Fake L. Illene Bolus, PharmD, BCPS Clinical Pharmacist Pager: [phone] 09/04/2011 8:18 AM

## 2011-09-04 NOTE — Progress Notes (Signed)
PATIENT DETAILS Name: [name] Age: 78 y.o. Sex: female Date of Birth: 03-Sep-1931 Admit Date: 08/28/2011 [id number], CAMMIE, MD  Subjective: Complaining of increased abdominal pain today after being walked a significant distance in the hall yesterday. Continues to report no flatus.  Objective: Vital signs in last 24 hours: Filed Vitals:   09/04/11 0000 09/04/11 0355 09/04/11 0400 09/04/11 0800  BP:  144/93  133/74  Pulse:  82  86  Temp:  98.4 F (36.9 C)  98.4 F (36.9 C)  TempSrc:  Oral  Oral  Resp: 16 15 16 16   Height:      Weight:      SpO2: 97% 93% 93% 96%    Assessment/Plan:  SBO (small bowel obstruction)/postoperative ileus -S/P Laparotomy with Lysis of adhesion on 7/17 -op note-reveiwed -post-op care per CCS -PICC Line and TNA started- not passing flatus - bowel sounds noted - will let surgery manage-  NG now out  HTN (hypertension) -Home BP meds including metoprolol on hold as NPO and BP was low/controlled without it. Blood pressure has rebounding but suspect being influenced by pain therefore begin low-dose IV beta-blockade and monitor for response.   Diabetes mellitus -complicated by hypoglycemia on 7/17-CBG's stable since then -A1C -6.2 -monitor CBG's, and continue SSI as needed  Hyponatremia -resolved with IVF  Prerenal acute renal failure -resolved with IVF  Anxiety -currently seems stable -institute as needed Benzo's when needed  Paroxysmal A-fib -Sinus rhythm with PAC's on telemetry -resume cardizem when appropriate-currently on hold as NPO-we have started low-dose IV Lopressor for now -c/w Digoxin IV  COPD -stable-no signs of acute exacerbation-lungs are clear to exam -c/w spiriva -as needed Xopenex  Disposition: -Transfer to surgical floor  DVT Prophylaxis: prophylactic Lovenox  Code Status: Full Code  Intake/Output from previous day:  Intake/Output Summary (Last 24 hours) at 09/04/11 1104 Last data filed at 09/04/11  1027  Gross per 24 hour  Intake   2629 ml  Output   1510 ml  Net   1119 ml    PHYSICAL EXAM: Weight change:  Body mass index is 18.75 kg/(m^2).   Gen Exam: Awake and alert with clear speech.  Chest: Clear to auscultation bilaterally without wheezes or focal crackles Cardiovascular: Regular rate and rhythm without gallop or rub Abdomen: soft, BS not appreciated, diffusely tender more so over midline surgical incision, parenteral nutrition infusing Musculoskeletal: Extremities are symmetrical and nontraumatic Neurologic: Non Focal.    CONSULTS: general surgery  LAB RESULTS: CBC  Lab 09/04/11 0400 09/01/11 0300 08/31/11 0410 08/30/11 0604 08/29/11 1034 08/29/11 0538 08/28/11 1344  WBC 3.2* 2.5* 2.3* 5.2 3.6* -- --  HGB 10.9* 9.9* 11.4* 12.7 12.0 -- --  HCT 32.4* 29.9* 33.6* 38.0 35.9* -- --  PLT 194 160 115* 192 182 -- --  MCV 93.6 94.3 92.6 95.0 93.5 -- --  MCH 31.5 31.2 31.4 31.8 31.3 -- --  MCHC 33.6 33.1 33.9 33.4 33.4 -- --  RDW 14.1 14.2 14.1 14.4 14.7 -- --  LYMPHSABS 1.0 0.7 -- -- 1.0 1.0 1.0  MONOABS 0.4 0.3 -- -- 0.4 0.4 0.4  EOSABS 0.0 0.0 -- -- 0.0 0.0 0.0  BASOSABS 0.0 0.0 -- -- 0.0 0.0 0.0  BANDABS -- -- -- -- -- -- --    Chemistries   Lab 09/04/11 0400 09/03/11 0406 09/02/11 0500 09/01/11 0300 08/31/11 0410  NA 134* 137 139 139 136  K 3.8 3.8 3.3* 3.6 4.9  CL 98 101 104 106 102  CO2  30 29 27 22 21   GLUCOSE 161* 180* 212* 112* 148*  BUN 14 11 14 13 14   CREATININE 0.40* 0.39* 0.45* 0.56 0.49*  CALCIUM 8.4 8.0* 8.2* 7.7* 7.9*  MG 1.9 2.0 1.8 1.8 --   CBG:  Lab 09/04/11 0727 09/04/11 0355 09/03/11 2336 09/03/11 2206 09/03/11 2000  GLUCAP 142* 170* 139* 99 162*   MICROBIOLOGY: Recent Results (from the past 240 hour(s))  MRSA PCR SCREENING     Status: Abnormal   Collection Time   08/30/11 12:55 PM      Component Value Range Status Comment   MRSA by PCR POSITIVE (*) NEGATIVE Final    ELLIS,ALLISON L., ANP  [facility] Pager [phone]  If  7PM-7AM, please contact night-coverage [email or web address] Password [email or web address] 09/04/2011, 11:04 AM   LOS: 7 days    I have personally examined this patient and reviewed the entire database. I have reviewed the above note, made any necessary editorial changes, and agree with its content.  Lonia Blood, MD [facility]

## 2011-09-04 NOTE — Progress Notes (Signed)
Notified Drs. Rizwan and Carolynne Edouard that pt's NG tube fell out--no complaints of nausea. OK to leave tube out per Dr. Carolynne Edouard. Renette Butters, Viona Gilmore

## 2011-09-04 NOTE — Progress Notes (Deleted)
Utilization review completed.  

## 2011-09-04 NOTE — Progress Notes (Signed)
Utilization review completed.  

## 2011-09-04 NOTE — Progress Notes (Signed)
Ileus.  NGT out ?  Leave out and keep NPO. If N/V  Replace NGT.

## 2011-09-04 NOTE — Progress Notes (Signed)
Patient transferred to 6N. Report called to RN on 6N and all questions answered. Patient's VVS, attempted to notified family (sons Theron Arista and Molly Maduro) but no answer.  Patient transferred via wheelchair with NT.

## 2011-09-05 LAB — GLUCOSE, CAPILLARY
Glucose-Capillary: 139 mg/dL — ABNORMAL HIGH (ref 70–99)
Glucose-Capillary: 150 mg/dL — ABNORMAL HIGH (ref 70–99)
Glucose-Capillary: 136 mg/dL — ABNORMAL HIGH (ref 70–99)

## 2011-09-05 MED ORDER — SALINE SPRAY 0.65 % NA SOLN
1.0000 | NASAL | Status: DC | PRN
  Filled 2011-09-05: qty 44

## 2011-09-05 MED ORDER — BUDESONIDE 0.25 MG/2ML IN SUSP
0.2500 mg | Freq: Two times a day (BID) | RESPIRATORY_TRACT | Status: DC
  Administered 2011-09-06 – 2011-09-08 (×4): 0.25 mg via RESPIRATORY_TRACT
  Filled 2011-09-05 (×8): qty 2

## 2011-09-05 MED ORDER — INSULIN REGULAR HUMAN 100 UNIT/ML IJ SOLN
INTRAVENOUS | Status: AC
  Administered 2011-09-05: 19:00:00 via INTRAVENOUS
  Filled 2011-09-05: qty 2000

## 2011-09-05 MED ORDER — TIOTROPIUM BROMIDE MONOHYDRATE 18 MCG IN CAPS
18.0000 ug | ORAL_CAPSULE | Freq: Every day | RESPIRATORY_TRACT | Status: DC
  Administered 2011-09-06 – 2011-09-07 (×2): 18 ug via RESPIRATORY_TRACT
  Filled 2011-09-05: qty 5

## 2011-09-05 MED ORDER — ARFORMOTEROL TARTRATE 15 MCG/2ML IN NEBU
15.0000 ug | INHALATION_SOLUTION | Freq: Two times a day (BID) | RESPIRATORY_TRACT | Status: DC
  Administered 2011-09-05 – 2011-09-08 (×5): 15 ug via RESPIRATORY_TRACT
  Filled 2011-09-05 (×9): qty 2

## 2011-09-05 NOTE — Progress Notes (Signed)
NPO until bowel function returns.  Abdomen less distended.

## 2011-09-05 NOTE — Progress Notes (Signed)
PATIENT DETAILS Name: [name] Age: 78 y.o. Sex: female Date of Birth: 1931-06-25 Admit Date: 08/28/2011 [id number], CAMMIE, MD  Principal Problem:  *SBO (small bowel obstruction) Active Problems:  AODM  COPD  HTN (hypertension)  LV dysfunction  Diabetes mellitus  Hyponatremia  Anxiety  A-fib  Prerenal acute renal failure  Hypotension  Nausea and vomiting     Assessment/Plan:  SBO (small bowel obstruction)/postoperative ileus -S/P Laparotomy with Lysis of adhesion on 7/17 -post-op care per CCS -PICC Line and TNA started on 7/19    HTN (hypertension) -Home BP meds are on hold now due to the lack of oral intake. The patient is on IV metoprolol every 6 hours which controls the BP well.  Diabetes mellitus -complicated by hypoglycemia on 7/17-CBG's stable since then -A1C -6.2 -monitor CBG's, and continue SSI as needed - Patient being on TNA, hyperglycemia management is per protocol controlled by pharmacy   Anxiety -currently seems stable -instituted as needed Benzo's when needed   COPD -stable-no signs of acute exacerbation-lungs are clear to exam -c/w spiriva -as needed Xopenex   DVT Prophylaxis: prophylactic Lovenox  Code Status: Full Code Subjective:  Still having some abdominal tenderness Objective: Vital signs in last 24 hours: Filed Vitals:   09/05/11 0400 09/05/11 0455 09/05/11 0753 09/05/11 0829  BP:  130/65  134/62  Pulse:  79  80  Temp:  98 F (36.7 C)    TempSrc:  Oral  Oral  Resp: 12 20 16 18   Height:      Weight:      SpO2: 98% 97% 100% 100%     Intake/Output from previous day:  Intake/Output Summary (Last 24 hours) at 09/05/11 1654 Last data filed at 09/05/11 1500  Gross per 24 hour  Intake   1160 ml  Output    352 ml  Net    808 ml    PHYSICAL EXAM: Weight change:  Body mass index is 18.75 kg/(m^2).  Frail Alert and oriented x3 Heart regular rate and rhythm Abdomen is soft, has a midline incision from the  xiphoid process to the pubis that looks healing nicely with staples  CONSULTS: general surgery  LAB RESULTS: CBC  Lab 09/04/11 0400 09/01/11 0300 08/31/11 0410 08/30/11 0604  WBC 3.2* 2.5* 2.3* 5.2  HGB 10.9* 9.9* 11.4* 12.7  HCT 32.4* 29.9* 33.6* 38.0  PLT 194 160 115* 192  MCV 93.6 94.3 92.6 95.0  MCH 31.5 31.2 31.4 31.8  MCHC 33.6 33.1 33.9 33.4  RDW 14.1 14.2 14.1 14.4  LYMPHSABS 1.0 0.7 -- --  MONOABS 0.4 0.3 -- --  EOSABS 0.0 0.0 -- --  BASOSABS 0.0 0.0 -- --  BANDABS -- -- -- --    Chemistries   Lab 09/04/11 0400 09/03/11 0406 09/02/11 0500 09/01/11 0300 08/31/11 0410  NA 134* 137 139 139 136  K 3.8 3.8 3.3* 3.6 4.9  CL 98 101 104 106 102  CO2 30 29 27 22 21   GLUCOSE 161* 180* 212* 112* 148*  BUN 14 11 14 13 14   CREATININE 0.40* 0.39* 0.45* 0.56 0.49*  CALCIUM 8.4 8.0* 8.2* 7.7* 7.9*  MG 1.9 2.0 1.8 1.8 --   CBG:  Lab 09/05/11 1630 09/05/11 1234 09/05/11 0754 09/05/11 0521 09/05/11 0003  GLUCAP 150* 139* 125* 152* 127*   MICROBIOLOGY: Recent Results (from the past 240 hour(s))  MRSA PCR SCREENING     Status: Abnormal   Collection Time   08/30/11 12:55 PM  Component Value Range Status Comment   MRSA by PCR POSITIVE (*) NEGATIVE Final       . antiseptic oral rinse  15 mL Mouth Rinse q12n4p  . arformoterol  15 mcg Nebulization Q12H  . budesonide  0.25 mg Nebulization BID  . chlorhexidine  15 mL Mouth Rinse BID  . digoxin  0.125 mg Intravenous Daily  . enoxaparin (LOVENOX) injection  30 mg Subcutaneous Q24H  . insulin aspart  0-9 Units Subcutaneous Q4H  . metoprolol  5 mg Intravenous Q6H  . morphine   Intravenous Q4H  . mupirocin ointment  1 application Nasal BID  . pantoprazole (PROTONIX) IV  40 mg Intravenous QHS  . sodium chloride  10-40 mL Intracatheter Q12H  . tiotropium  18 mcg Inhalation QHS  . DISCONTD: tiotropium  18 mcg Inhalation Daily      Lonna Rabold, MD  [facility] Pager [phone]  If 7PM-7AM, please contact  night-coverage [email or web address] Password [facility] 09/05/2011, 4:54 PM   LOS: 8 days

## 2011-09-05 NOTE — Progress Notes (Signed)
Patient ID: [name], female   DOB: 1931/02/28, 78 y.o.   MRN: [id number] 6 Days Post-Op  Subjective: Patient reports some chest pain this am after getting up to bedside commode.  Denies BM, abd still very tight, pain relatively well controlled, denies flatus..  Objective: Vital signs in last 24 hours: Temp:  [98 F (36.7 C)-98.1 F (36.7 C)] 98 F (36.7 C) (07/23 0455) Pulse Rate:  [74-80] 80  (07/23 0829) Resp:  [12-27] 18  (07/23 0829) BP: (130-154)/(54-74) 134/62 mmHg (07/23 0829) SpO2:  [96 %-100 %] 100 % (07/23 0829) Last BM Date: 08/25/11  Intake/Output from previous day: 09-18-22 0701 - 07/23 0700 In: 851 [I.V.:431; TPN:420] Out: 850 [Urine:850] Intake/Output this shift:    General appearance: alert, cooperative, appears stated age and no distress Chest: CTA Abdomen: staples are c/d/i no ertythema or drainage seen. Still very tender to palpation, abdomen is firm, tympanic, no BS, no flatus.  Lab Results:   Basename 09/18/2011 0400  WBC 3.2*  HGB 10.9*  HCT 32.4*  PLT 194   BMET  Basename 18-Sep-2011 0400 09/03/11 0406  NA 134* 137  K 3.8 3.8  CL 98 101  CO2 30 29  GLUCOSE 161* 180*  BUN 14 11  CREATININE 0.40* 0.39*  CALCIUM 8.4 8.0*   PT/INR No results found for this basename: LABPROT:2,INR:2 in the last 72 hours ABG No results found for this basename: PHART:2,PCO2:2,PO2:2,HCO3:2 in the last 72 hours  Studies/Results: Dg Abd 2 Views  09/18/11  *RADIOLOGY REPORT*  Clinical Data: Abdominal pain  ABDOMEN - 2 VIEW  Comparison: 08/30/2011  Findings: Laparotomy staples in the midline.  Mildly dilated large and small bowel.  Improvement in small bowel dilatation since the prior study.  Current pattern is most consistent with postop ileus. No free air.  Small bilateral pleural effusions.  IMPRESSION: Mild postop ileus.  Original Report Authenticated By: Camelia Phenes, M.D.    Anti-infectives: Anti-infectives     Start     Dose/Rate Route Frequency Ordered  Stop   08/30/11 2000   Ampicillin-Sulbactam (UNASYN) 3 g in sodium chloride 0.9 % 100 mL IVPB        3 g 100 mL/hr over 60 Minutes Intravenous Every 6 hours 08/30/11 1920 08/31/11 1517   08/30/11 1500   Ampicillin-Sulbactam (UNASYN) 3 g in sodium chloride 0.9 % 100 mL IVPB        3 g 100 mL/hr over 60 Minutes Intravenous  Once 08/30/11 1439 08/30/11 1500          Assessment/Plan: POD#6-Exploratory laparotomy with lysis of adhesions, excision of mesenteric mass 1. Remain NPO, abd still distended but maybe less tender then yesterday 2. OOB 3. Medical management per medicine team  LOS: 8 days    Mads Borgmeyer 09/05/2011

## 2011-09-05 NOTE — Progress Notes (Signed)
PARENTERAL NUTRITION CONSULT NOTE - FOLLOW UP  Pharmacy Consult for TPN Indication: Prolonged Ileus  Allergies  Allergen Reactions  . Epinephrine Other (See Comments)    Patient thinks her heart stopped and the MD said never to take this again.  Berle Mull Dye (Iodinated Diagnostic Agents) Other (See Comments)    Unknown-patient said she thinks her heart stopped, but she really can't remember  . Albuterol Sulfate Nausea And Vomiting    Pt reports she is able to tolerate the albuterol hfa, not the nebulizer solution.  Jonne Ply (Aspirin) Other (See Comments)    Heart doctor told her to take advil instead    Patient Measurements: Height: 5\' 2"  (157.5 cm) Weight: 102 lb 8 oz (46.494 kg) IBW/kg (Calculated) : 50.1   Vital Signs: Temp: 98 F (36.7 C) (07/23 0455) Temp src: Oral (07/23 0455) BP: 130/65 mmHg (07/23 0455) Pulse Rate: 79  (07/23 0455) Intake/Output from previous day: 07/22 0701 - 07/23 0700 In: 851 [I.V.:431; TPN:420] Out: 850 [Urine:850] Intake/Output from this shift:    Labs:  Basename 09/04/11 0400  WBC 3.2*  HGB 10.9*  HCT 32.4*  PLT 194  APTT --  INR --     Basename 09/04/11 0400 09/03/11 0406  NA 134* 137  K 3.8 3.8  CL 98 101  CO2 30 29  GLUCOSE 161* 180*  BUN 14 11  CREATININE 0.40* 0.39*  LABCREA -- --  CREAT24HRUR -- --  CALCIUM 8.4 8.0*  MG 1.9 2.0  PHOS 2.9 2.3  PROT 4.9* --  ALBUMIN 2.2* --  AST 13 --  ALT 9 --  ALKPHOS 44 --  BILITOT 0.2* --  BILIDIR -- --  IBILI -- --  PREALBUMIN 10.6* --  TRIG 71 --  CHOLHDL -- --  CHOL 115 --   Estimated Creatinine Clearance: 42.5 ml/min (by C-G formula based on Cr of 0.4).    Basename 09/05/11 0754 09/05/11 0521 09/05/11 0003  GLUCAP 125* 152* 127*    Insulin Requirements in the past 24 hours:  4 units SSI with 18 units insulin in TPN on sensitive SSI q4h  Current Nutrition:  NPO, Clinimix E5/15 at 60 ml/hr ,Lipids MWF only at 8 mlhr (at goal)  Assessment: TPN continues for slowly  resolving ileus 4 days s/p ex lap with LOA for SBO.  GI: Noted NGT fell out - no nausea noted so MD ok'd leaving tube out for now Endo: Hx of DM, currently on SSI, insulin added to TPN bag 7/20. CBG control much improved Lytes: No lytes this am however have been stable. In setting of ileus, target K ~4, Mg ~2 and Phos ~3. Renal: Scr stable, UOP good Pulm: RA Cards: VSS, noted cont on home dig. Isolated BP elevations - on prn lopressor IV Hepatobil: LFTs WNL, prealbumin low for goal 18-40 but trending up slightly 9.8>10.6. Trig and Chol WNL. ID: Afeb, WBC low   Best Practices: LMWH, IV PPI  Nutritional Goals:  1400-1600 kCal, 65-75 grams of protein per day  Plan:  - Continue Clinimix to E 5/15 at goal of 60cc/hr (provides 72gm protein and avg of 1187 Kcal per day) - Continue same SSI, 18 units insulin/bag TNA - Lipids and MVI/Trace elements MWF only d/t national shortage - Will f/up BMP, cbg in AM  Ka Flam[place] L. Illene Bolus, PharmD, BCPS Clinical Pharmacist Pager: [phone] 09/05/2011 8:19 AM

## 2011-09-06 ENCOUNTER — Inpatient Hospital Stay (HOSPITAL_COMMUNITY): Payer: Medicare Other

## 2011-09-06 DIAGNOSIS — F411 Generalized anxiety disorder: Secondary | ICD-10-CM

## 2011-09-06 DIAGNOSIS — J441 Chronic obstructive pulmonary disease with (acute) exacerbation: Secondary | ICD-10-CM

## 2011-09-06 LAB — BASIC METABOLIC PANEL
BUN: 17 mg/dL (ref 6–23)
CO2: 28 mEq/L (ref 19–32)
Calcium: 8.4 mg/dL (ref 8.4–10.5)
Chloride: 100 mEq/L (ref 96–112)
Creatinine, Ser: 0.44 mg/dL — ABNORMAL LOW (ref 0.50–1.10)

## 2011-09-06 LAB — GLUCOSE, CAPILLARY
Glucose-Capillary: 119 mg/dL — ABNORMAL HIGH (ref 70–99)
Glucose-Capillary: 127 mg/dL — ABNORMAL HIGH (ref 70–99)

## 2011-09-06 MED ORDER — GLYCERIN (LAXATIVE) 2.1 G RE SUPP
1.0000 | Freq: Once | RECTAL | Status: AC
  Administered 2011-09-06: 1 via RECTAL
  Filled 2011-09-06: qty 1

## 2011-09-06 MED ORDER — FAT EMULSION 20 % IV EMUL
192.0000 mL | INTRAVENOUS | Status: AC
  Administered 2011-09-06: 192 mL via INTRAVENOUS
  Filled 2011-09-06: qty 200

## 2011-09-06 MED ORDER — ZINC TRACE METAL 1 MG/ML IV SOLN
INTRAVENOUS | Status: AC
  Administered 2011-09-06: 18:00:00 via INTRAVENOUS
  Filled 2011-09-06: qty 2000

## 2011-09-06 MED ORDER — MORPHINE SULFATE 2 MG/ML IJ SOLN
1.0000 mg | INTRAMUSCULAR | Status: DC | PRN
  Administered 2011-09-06: 1 mg via INTRAVENOUS
  Filled 2011-09-06: qty 1

## 2011-09-06 NOTE — Progress Notes (Signed)
Subjective: Patient started on clear liquid diet today, she notes that she has not had any nausea with it so far.  No other specific complaints.  Objective: Vital signs in last 24 hours: Filed Vitals:   09/06/11 0205 09/06/11 0426 09/06/11 0548 09/06/11 1011  BP: 136/77  127/77 116/66  Pulse: 77  73 76  Temp: 97.8 F (36.6 C)  97.9 F (36.6 C) 98 F (36.7 C)  TempSrc:   Oral Oral  Resp: 17 18 18 18   Height:      Weight:      SpO2: 100% 97% 96% 96%   Weight change:   Intake/Output Summary (Last 24 hours) at 09/06/11 1352 Last data filed at 09/06/11 0426  Gross per 24 hour  Intake 5429.82 ml  Output      1 ml  Net 5428.82 ml    Physical Exam: General: Awake, Oriented, No acute distress. HEENT: EOMI. Neck: Supple CV: S1 and S2 Lungs: Clear to ascultation bilaterally Abdomen: Soft, Nontender, Nondistended, +bowel sounds.  Abdominal incision noted. Ext: Good pulses. Trace edema.  Lab Results: Basic Metabolic Panel:  Lab 09/06/11 6045 09/04/11 0400 09/03/11 0406 09/02/11 0500 09/01/11 0300  NA 137 134* 137 139 139  K 3.8 3.8 3.8 3.3* 3.6  CL 100 98 101 104 106  CO2 28 30 29 27 22   GLUCOSE 140* 161* 180* 212* 112*  BUN 17 14 11 14 13   CREATININE 0.44* 0.40* 0.39* 0.45* 0.56  CALCIUM 8.4 8.4 8.0* 8.2* 7.7*  MG -- 1.9 2.0 1.8 1.8  PHOS -- 2.9 2.3 1.4* 2.4   Liver Function Tests:  Lab 09/04/11 0400 09/01/11 0300  AST 13 22  ALT 9 11  ALKPHOS 44 43  BILITOT 0.2* 0.2*  PROT 4.9* 4.6*  ALBUMIN 2.2* 2.2*   No results found for this basename: LIPASE:5,AMYLASE:5 in the last 168 hours No results found for this basename: AMMONIA:5 in the last 168 hours CBC:  Lab 09/04/11 0400 09/01/11 0300 08/31/11 0410  WBC 3.2* 2.5* 2.3*  NEUTROABS 1.7 1.6* --  HGB 10.9* 9.9* 11.4*  HCT 32.4* 29.9* 33.6*  MCV 93.6 94.3 92.6  PLT 194 160 115*   Cardiac Enzymes: No results found for this basename: CKTOTAL:5,CKMB:5,CKMBINDEX:5,TROPONINI:5 in the last 168 hours BNP (last 3  results)  Basename 01/02/11 0211  PROBNP 321.0   CBG:  Lab 09/06/11 0753 09/06/11 0411 09/05/11 2359 09/05/11 1953 09/05/11 1630  GLUCAP 127* 119* 130* 136* 150*   No results found for this basename: HGBA1C:5 in the last 72 hours Other Labs: No components found with this basename: POCBNP:3 No results found for this basename: DDIMER:2 in the last 168 hours  Lab 09/04/11 0400 09/01/11 0300  CHOL 115 94  HDL -- --  LDLCALC -- --  TRIG 71 55  CHOLHDL -- --  LDLDIRECT -- --   No results found for this basename: TSH,T4TOTAL,FREET3,T3FREE,FREET4,THYROIDAB in the last 168 hours No results found for this basename: VITAMINB12:2,FOLATE:2,FERRITIN:2,TIBC:2,IRON:2,RETICCTPCT:2 in the last 168 hours  Micro Results: Recent Results (from the past 240 hour(s))  MRSA PCR SCREENING     Status: Abnormal   Collection Time   08/30/11 12:55 PM      Component Value Range Status Comment   MRSA by PCR POSITIVE (*) NEGATIVE Final     Studies/Results: No results found.  Medications: I have reviewed the patient's current medications. Scheduled Meds:   . antiseptic oral rinse  15 mL Mouth Rinse q12n4p  . arformoterol  15 mcg Nebulization Q12H  .  budesonide  0.25 mg Nebulization BID  . chlorhexidine  15 mL Mouth Rinse BID  . digoxin  0.125 mg Intravenous Daily  . enoxaparin (LOVENOX) injection  30 mg Subcutaneous Q24H  . Glycerin (Adult)  1 suppository Rectal Once  . insulin aspart  0-9 Units Subcutaneous Q4H  . metoprolol  5 mg Intravenous Q6H  . pantoprazole (PROTONIX) IV  40 mg Intravenous QHS  . sodium chloride  10-40 mL Intracatheter Q12H  . tiotropium  18 mcg Inhalation QHS  . DISCONTD: morphine   Intravenous Q4H   Continuous Infusions:   . sodium chloride 60 mL/hr at 09/06/11 0426  . fat emulsion 192 mL (09/04/11 1752)  . fat emulsion    . TPN (CLINIMIX) +/- additives 60 mL/hr at 09/04/11 1752  . TPN (CLINIMIX) +/- additives 60 mL/hr at 09/05/11 1835  . TPN (CLINIMIX) +/-  additives     PRN Meds:.acetaminophen, acetaminophen, dextrose, levalbuterol, LORazepam, morphine injection, ondansetron (ZOFRAN) IV, phenol, sodium chloride, sodium chloride, DISCONTD: diphenhydrAMINE, DISCONTD: naloxone, DISCONTD: sodium chloride  Assessment/Plan: SBO (small bowel obstruction)/postoperative ileus  S/P Laparotomy with Lysis of adhesion on 7/17. Post-op care per [facility]. PICC Line and TNA started on 7/19.  Started on clear liquid diet, advance as tolerated.  HTN (hypertension)  Home BP meds are on hold now due to the lack of oral intake. Continue IV metoprolol every 6 hours.  Restart antihypertensive medications the next few days depending on patient's clinical progress on oral intake.  Diabetes mellitus  Complicated by hypoglycemia on 7/17, CBG's stable since then.  Hemoglobin A1c 6.2 on 08/28/2011. Monitor CBG's, and continue SSI as needed. Patient being on TNA, hyperglycemia management is per protocol controlled by pharmacy.  Anxiety  Currently seems stable.  Continue benzodiazepines as needed.  COPD  Stable, no signs of acute exacerbation.  Continue spiriva. As needed Xopenex.  Prophylaxis Lovenox   Code Status Full Code   LOS: 9 days  Kameren Pargas A, MD 09/06/2011, 1:52 PM

## 2011-09-06 NOTE — Progress Notes (Signed)
Patient ID: [name], female   DOB: [birth year], 78 y.o.   MRN: [id number] Patient ID: [name], female   DOB: 12-Oct-1931, 78 y.o.   MRN: [id number] 7 Days Post-Op  Subjective: Patient reports some flatus this morning, no BM. abd still very tight, pain well controlled, denies  Need/use of PCA.  Objective: Vital signs in last 24 hours: Temp:  [97.8 F (36.6 C)-98.3 F (36.8 C)] 97.9 F (36.6 C) (07/24 0548) Pulse Rate:  [72-80] 73  (07/24 0548) Resp:  [17-20] 18  (07/24 0548) BP: (127-152)/(68-77) 127/77 mmHg (07/24 0548) SpO2:  [96 %-100 %] 96 % (07/24 0548) Last BM Date: 08/25/11  Intake/Output from previous day: 07/23 0701 - 07/24 0700 In: 5429.8 [I.V.:4749.8; TPN:680] Out: 2 [Urine:2] Intake/Output this shift:    General appearance: alert, cooperative, appears stated age and no distress Chest: CTA Abdomen: staples are c/d/i no ertythema or drainage seen. Still very tender to palpation, abdomen is firm, but less tympanic, no BS, patient reports flatus. Afebrile, VSS.   Lab Results:   Basename 04-Oct-2011 0400  WBC 3.2*  HGB 10.9*  HCT 32.4*  PLT 194   BMET  Basename 09/06/11 0615 2011/10/04 0400  NA 137 134*  K 3.8 3.8  CL 100 98  CO2 28 30  GLUCOSE 140* 161*  BUN 17 14  CREATININE 0.44* 0.40*  CALCIUM 8.4 8.4   PT/INR No results found for this basename: LABPROT:2,INR:2 in the last 72 hours ABG No results found for this basename: PHART:2,PCO2:2,PO2:2,HCO3:2 in the last 72 hours  Studies/Results: Dg Abd 2 Views  October 04, 2011  *RADIOLOGY REPORT*  Clinical Data: Abdominal pain  ABDOMEN - 2 VIEW  Comparison: 08/30/2011  Findings: Laparotomy staples in the midline.  Mildly dilated large and small bowel.  Improvement in small bowel dilatation since the prior study.  Current pattern is most consistent with postop ileus. No free air.  Small bilateral pleural effusions.  IMPRESSION: Mild postop ileus.  Original Report Authenticated By: Camelia Phenes, M.D.     Anti-infectives: Anti-infectives     Start     Dose/Rate Route Frequency Ordered Stop   08/30/11 2000   Ampicillin-Sulbactam (UNASYN) 3 g in sodium chloride 0.9 % 100 mL IVPB        3 g 100 mL/hr over 60 Minutes Intravenous Every 6 hours 08/30/11 1920 08/31/11 1517   08/30/11 1500   Ampicillin-Sulbactam (UNASYN) 3 g in sodium chloride 0.9 % 100 mL IVPB        3 g 100 mL/hr over 60 Minutes Intravenous  Once 08/30/11 1439 08/30/11 1500          Assessment/Plan: POD#6-Exploratory laparotomy with lysis of adhesions, excision of mesenteric mass 1. Remain NPO, abd still distended but maybe less tender then yesterday, will let her have few ice chips for now re-eval later today. 2. OOB, use abdominal binder with ambulation. 3. Medical management per medicine team  LOS: 9 days    Cheryn Lundquist 09/06/2011

## 2011-09-06 NOTE — Progress Notes (Signed)
Suppository and recheck films today.

## 2011-09-06 NOTE — Progress Notes (Signed)
PARENTERAL NUTRITION CONSULT NOTE - FOLLOW UP  Pharmacy Consult for TPN Indication: Prolonged Ileus  Allergies  Allergen Reactions  . Epinephrine Other (See Comments)    Patient thinks her heart stopped and the MD said never to take this again.  Berle Mull Dye (Iodinated Diagnostic Agents) Other (See Comments)    Unknown-patient said she thinks her heart stopped, but she really can't remember  . Albuterol Sulfate Nausea And Vomiting    Pt reports she is able to tolerate the albuterol hfa, not the nebulizer solution.  Jonne Ply (Aspirin) Other (See Comments)    Heart doctor told her to take advil instead    Patient Measurements: Height: 5\' 2"  (157.5 cm) Weight: 102 lb 8 oz (46.494 kg) IBW/kg (Calculated) : 50.1   Vital Signs: Temp: 97.9 F (36.6 C) (07/24 0548) Temp src: Oral (07/24 0548) BP: 127/77 mmHg (07/24 0548) Pulse Rate: 73  (07/24 0548) Intake/Output from previous day: 07/23 0701 - 07/24 0700 In: 5429.8 [I.V.:4749.8; TPN:680] Out: 2 [Urine:2] Intake/Output from this shift:    Labs:  Basename 09/04/11 0400  WBC 3.2*  HGB 10.9*  HCT 32.4*  PLT 194  APTT --  INR --     Basename 09/06/11 0615 09/04/11 0400  NA 137 134*  K 3.8 3.8  CL 100 98  CO2 28 30  GLUCOSE 140* 161*  BUN 17 14  CREATININE 0.44* 0.40*  LABCREA -- --  CREAT24HRUR -- --  CALCIUM 8.4 8.4  MG -- 1.9  PHOS -- 2.9  PROT -- 4.9*  ALBUMIN -- 2.2*  AST -- 13  ALT -- 9  ALKPHOS -- 44  BILITOT -- 0.2*  BILIDIR -- --  IBILI -- --  PREALBUMIN -- 10.6*  TRIG -- 71  CHOLHDL -- --  CHOL -- 115   Estimated Creatinine Clearance: 42.5 ml/min (by C-G formula based on Cr of 0.44).    Basename 09/06/11 0753 09/06/11 0411 09/05/11 2359  GLUCAP 127* 119* 130*    Insulin Requirements in the past 24 hours:  2 units SSI with 18 units insulin in TPN on sensitive SSI q4h  Current Nutrition:  NPO, Clinimix E5/15 at 60 ml/hr, Lipids MWF only at 8 mlhr (at goal)  Assessment: TPN continues for  slowly resolving ileus s/p ex lap with LOA for SBO.  GI: Noted NGT fell out - no nausea noted so MD ok'd leaving tube out for now.  To start ice chips today Endo: Hx of DM, currently on SSI, insulin added to TPN bag 7/20. CBG control much improved Lytes: Lytes stable; In setting of ileus, target K ~4, Mg ~2 and Phos ~3. Renal: Scr stable, UOP good Pulm: RA Cards: VSS, noted cont on home dig. Isolated BP elevations - on prn lopressor IV Hepatobil: LFTs WNL, prealbumin low for goal 18-40 but trending up slightly 9.8>10.6. Trig and Chol WNL. ID: Afeb, WBC low   Best Practices: LMWH, IV PPI, MC  Nutritional Goals:  1400-1600 kCal, 65-75 grams of protein per day  Plan:  - Continue Clinimix to E 5/15 at goal of 60cc/hr (provides 72gm protein and avg of 1187 Kcal per day) - Continue same SSI, 18 units insulin/bag TNA - Lipids and MVI/Trace elements MWF only d/t national shortage - Will f/up am bmp, mag phos, cbg in AM  [place] L. Illene Bolus, PharmD, BCPS Clinical Pharmacist Pager: [phone] 09/06/2011 9:19 AM

## 2011-09-06 NOTE — Progress Notes (Signed)
Nutrition Follow-up  Intervention:   1. TPN per pharmacy 2. Advance diet as tolerated per MD  Assessment:   Patient continues on TPN. Receiving Clinimix 5/15 at 60 ml/hr. 20% lipids, multivitamins, and trace elements provided MWF due to national backorder. This provides an average of 1187 kcal (85% needs) and 72 g protein (100% needs).   Patient is tolerating TPN well, no signs of refeeding. Patient reports flatus, but no BM yet. Diet advanced to Clear liquids. Patient tolerating coffee and ice chips.   Diet Order:  Clear liquids  Meds: Scheduled Meds:   . antiseptic oral rinse  15 mL Mouth Rinse q12n4p  . arformoterol  15 mcg Nebulization Q12H  . budesonide  0.25 mg Nebulization BID  . chlorhexidine  15 mL Mouth Rinse BID  . digoxin  0.125 mg Intravenous Daily  . enoxaparin (LOVENOX) injection  30 mg Subcutaneous Q24H  . Glycerin (Adult)  1 suppository Rectal Once  . insulin aspart  0-9 Units Subcutaneous Q4H  . metoprolol  5 mg Intravenous Q6H  . pantoprazole (PROTONIX) IV  40 mg Intravenous QHS  . sodium chloride  10-40 mL Intracatheter Q12H  . tiotropium  18 mcg Inhalation QHS  . DISCONTD: morphine   Intravenous Q4H   Continuous Infusions:   . sodium chloride 60 mL/hr at 09/06/11 0426  . fat emulsion 192 mL (09/04/11 1752)  . fat emulsion    . TPN (CLINIMIX) +/- additives 60 mL/hr at 09/04/11 1752  . TPN (CLINIMIX) +/- additives 60 mL/hr at 09/05/11 1835  . TPN (CLINIMIX) +/- additives     PRN Meds:.acetaminophen, acetaminophen, dextrose, levalbuterol, LORazepam, morphine injection, ondansetron (ZOFRAN) IV, phenol, sodium chloride, sodium chloride, DISCONTD: diphenhydrAMINE, DISCONTD: naloxone, DISCONTD: sodium chloride  Labs:  CMP     Component Value Date/Time   NA 137 09/06/2011 0615   K 3.8 09/06/2011 0615   CL 100 09/06/2011 0615   CO2 28 09/06/2011 0615   GLUCOSE 140* 09/06/2011 0615   BUN 17 09/06/2011 0615   CREATININE 0.44* 09/06/2011 0615   CALCIUM 8.4  09/06/2011 0615   PROT 4.9* 09/04/2011 0400   ALBUMIN 2.2* 09/04/2011 0400   AST 13 09/04/2011 0400   ALT 9 09/04/2011 0400   ALKPHOS 44 09/04/2011 0400   BILITOT 0.2* 09/04/2011 0400   GFRNONAA >90 09/06/2011 0615   GFRAA >90 09/06/2011 0615     Intake/Output Summary (Last 24 hours) at 09/06/11 1331 Last data filed at 09/06/11 0426  Gross per 24 hour  Intake 5429.82 ml  Output      1 ml  Net 5428.82 ml    Weight Status:  No recent weight  Re-estimated needs:  Unchanged, 1400-1600 kcal, 65-75 g protein  Nutrition Dx:  Inadequate oral intake, ongoing  Goal:  TPN to meet >90% of estimated protein needs, met  Monitor:  TPN tolerance/adequace, diet advancement, weight, labs.    Linnell Fulling, RD, LDN Pager #: [phone] After-Hours Pager #: [phone]
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
## 2011-09-07 LAB — PHOSPHORUS: Phosphorus: 3.7 mg/dL (ref 2.3–4.6)

## 2011-09-07 LAB — GLUCOSE, CAPILLARY
Glucose-Capillary: 113 mg/dL — ABNORMAL HIGH (ref 70–99)
Glucose-Capillary: 135 mg/dL — ABNORMAL HIGH (ref 70–99)
Glucose-Capillary: 136 mg/dL — ABNORMAL HIGH (ref 70–99)
Glucose-Capillary: 145 mg/dL — ABNORMAL HIGH (ref 70–99)

## 2011-09-07 LAB — COMPREHENSIVE METABOLIC PANEL
Albumin: 2.3 g/dL — ABNORMAL LOW (ref 3.5–5.2)
Alkaline Phosphatase: 45 U/L (ref 39–117)
BUN: 17 mg/dL (ref 6–23)
CO2: 30 mEq/L (ref 19–32)
Chloride: 101 mEq/L (ref 96–112)
Creatinine, Ser: 0.45 mg/dL — ABNORMAL LOW (ref 0.50–1.10)
GFR calc Af Amer: >90 mL/min (ref >90)
GFR calc non Af Amer: >90 mL/min (ref >90)
Glucose, Bld: 125 mg/dL — ABNORMAL HIGH (ref 70–99)
Potassium: 3.7 mEq/L (ref 3.5–5.1)
Total Bilirubin: 0.2 mg/dL — ABNORMAL LOW (ref 0.3–1.2)

## 2011-09-07 LAB — MAGNESIUM: Magnesium: 1.9 mg/dL (ref 1.5–2.5)

## 2011-09-07 MED ORDER — MAGNESIUM SULFATE IN D5W 10-5 MG/ML-% IV SOLN
1.0000 g | Freq: Once | INTRAVENOUS | Status: AC
  Administered 2011-09-07: 1 g via INTRAVENOUS
  Filled 2011-09-07: qty 100

## 2011-09-07 MED ORDER — MONTELUKAST SODIUM 10 MG PO TABS
10.0000 mg | ORAL_TABLET | Freq: Every day | ORAL | Status: DC
  Administered 2011-09-07 – 2011-09-08 (×2): 10 mg via ORAL
  Filled 2011-09-07 (×2): qty 1

## 2011-09-07 MED ORDER — METOPROLOL TARTRATE 12.5 MG HALF TABLET
12.5000 mg | ORAL_TABLET | Freq: Two times a day (BID) | ORAL | Status: DC
  Administered 2011-09-07 – 2011-09-08 (×2): 12.5 mg via ORAL
  Filled 2011-09-07 (×3): qty 1

## 2011-09-07 MED ORDER — INSULIN REGULAR HUMAN 100 UNIT/ML IJ SOLN
INTRAVENOUS | Status: DC
  Administered 2011-09-07: 17:00:00 via INTRAVENOUS
  Filled 2011-09-07: qty 2000

## 2011-09-07 MED ORDER — PRAVASTATIN SODIUM 40 MG PO TABS
40.0000 mg | ORAL_TABLET | Freq: Every day | ORAL | Status: DC
  Administered 2011-09-07: 40 mg via ORAL
  Filled 2011-09-07 (×2): qty 1

## 2011-09-07 MED ORDER — DIGOXIN 125 MCG PO TABS
125.0000 ug | ORAL_TABLET | Freq: Every day | ORAL | Status: DC
  Administered 2011-09-08: 125 ug via ORAL
  Filled 2011-09-07 (×2): qty 1

## 2011-09-07 MED ORDER — SIMVASTATIN 20 MG PO TABS: 20.0000 mg | ORAL_TABLET | Freq: Every day | ORAL | Status: DC

## 2011-09-07 MED ORDER — DILTIAZEM HCL ER COATED BEADS 120 MG PO CP24
120.0000 mg | ORAL_CAPSULE | Freq: Every day | ORAL | Status: DC
  Administered 2011-09-07 – 2011-09-08 (×2): 120 mg via ORAL
  Filled 2011-09-07 (×2): qty 1

## 2011-09-07 MED ORDER — POTASSIUM CHLORIDE 10 MEQ/50ML IV SOLN
10.0000 meq | INTRAVENOUS | Status: AC
  Administered 2011-09-07 (×3): 10 meq via INTRAVENOUS
  Filled 2011-09-07 (×3): qty 50

## 2011-09-07 NOTE — Progress Notes (Signed)
I have read and agree with below treatment note and plan.  Zyaire Dumas Helen Whitlow PT, DPT Pager: [phone] 

## 2011-09-07 NOTE — Progress Notes (Signed)
PARENTERAL NUTRITION CONSULT NOTE - FOLLOW UP  Pharmacy Consult for TPN Indication: Prolonged Ileus  Allergies  Allergen Reactions  . Epinephrine Other (See Comments)    Patient thinks her heart stopped and the MD said never to take this again.  Berle Mull Dye (Iodinated Diagnostic Agents) Other (See Comments)    Unknown-patient said she thinks her heart stopped, but she really can't remember  . Albuterol Sulfate Nausea And Vomiting    Pt reports she is able to tolerate the albuterol hfa, not the nebulizer solution.  Jonne Ply (Aspirin) Other (See Comments)    Heart doctor told her to take advil instead    Patient Measurements: Height: 5\' 2"  (157.5 cm) Weight: 102 lb 8 oz (46.494 kg) IBW/kg (Calculated) : 50.1   Vital Signs: Temp: 97.9 F (36.6 C) (07/25 0548) Temp src: Oral (07/25 0548) BP: 131/65 mmHg (07/25 0548) Pulse Rate: 72  (07/25 0548) Intake/Output from previous day: 07/24 0701 - 07/25 0700 In: 3078 [P.O.:300; I.V.:1556; TPN:1222] Out: 402 [Urine:401; Stool:1] Intake/Output from this shift:    Labs: No results found for this basename: WBC:3,HGB:3,HCT:3,PLT:3,APTT:3,INR:3 in the last 72 hours   Basename 09/07/11 0535 09/06/11 0615  NA 138 137  K 3.7 3.8  CL 101 100  CO2 30 28  GLUCOSE 125* 140*  BUN 17 17  CREATININE 0.45* 0.44*  LABCREA -- --  CREAT24HRUR -- --  CALCIUM 8.4 8.4  MG 1.9 --  PHOS 3.7 --  PROT 4.9* --  ALBUMIN 2.3* --  AST 15 --  ALT 9 --  ALKPHOS 45 --  BILITOT 0.2* --  BILIDIR -- --  IBILI -- --  PREALBUMIN -- --  TRIG -- --  CHOLHDL -- --  CHOL -- --   Estimated Creatinine Clearance: 42.5 ml/min (by C-G formula based on Cr of 0.45).    Basename 09/07/11 0429 09/07/11 0024 09/06/11 1944  GLUCAP 113* 135* 159*    Insulin Requirements in the past 24 hours:  3 units SSI with 18 units insulin in TPN on sensitive SSI q4h  Current Nutrition:  NPO, Clinimix E5/15 at 60 ml/hr, Lipids MWF only at 8 mlhr (at goal)  Assessment: TPN  continues for slowly resolving ileus s/p ex lap with LOA for SBO.  GI: Noted NGT fell out - left out d/t no nausea. Continues on CLq diet Endo: Hx of DM, currently on SSI, insulin added to TPN bag 7/20. CBG controlled. Lytes: Lytes stable; In setting of ileus, target K ~4, Mg ~2 and Phos ~3. Renal: Scr stable, UOP low. Noted TPN+MIVF provides 38ml/kg/hr Pulm: RA Cards: VSS, noted cont on home dig.  Hepatobil: LFTs WNL, prealbumin low for goal 18-40 but trending up slightly 9.8>10.6. Trig and Chol WNL. ID: Afeb, WBC low   Best Practices: LMWH, IV PPI, MC  Nutritional Goals:  1400-1600 kCal, 65-75 grams of protein per day  Plan:  - Continue Clinimix to E 5/15 at goal of 60cc/hr (provides 72gm protein and avg of 1187 Kcal per day) - Continue same SSI, 18 units insulin/bag TNA - Lipids and MVI/Trace elements MWF only d/t national shortage - Change MIVF to KVO - 3 Kruns and 1gm IV Mg - Will f/up PO plans - BMET and Mg in AM  Emelly Wurtz K. Allena Katz, PharmD, BCPS.  Clinical Pharmacist Pager [phone]. 09/07/2011 8:26 AM

## 2011-09-07 NOTE — Progress Notes (Signed)
Physical Therapy Treatment Patient Details Name: [name] MRN: [id number] DOB: [birth year] Today's Date: 09/07/2011 Time: 1478-2956 PT Time Calculation (min): 22 min  PT Assessment / Plan / Recommendation Comments on Treatment Session  Pt tolerated ambulation well and was able to increase distance over last session. Pt gait speed measured at 1.4 ft/sec (increased fall risk if below 1.8 ft/sec).    Follow Up Recommendations  No PT follow up    Barriers to Discharge        Equipment Recommendations  None recommended by PT (Pt has RW.)    Recommendations for Other Services    Frequency Min 3X/week   Plan Discharge plan remains appropriate    Precautions / Restrictions  none       Mobility  Bed Mobility Supine to Sit: 7: Independent Sit to Supine: 7: Independent Transfers Transfers: Sit to Stand;Stand to Sit Sit to Stand: 6: Modified independent (Device/Increase time) Stand to Sit: 6: Modified independent (Device/Increase time) Ambulation/Gait Ambulation/Gait Assistance: 5: Supervision Ambulation Distance (Feet): 150 Feet Assistive device: Rolling walker Ambulation/Gait Assistance Details: Pt supervision for safety due to generalized weakness and mild impulsiveness. Pt required verbal cues to allow for safe management of IV pole and for not picking up the walker during turns (pt moving quickly and taking corners quickly, placing stretch on the IVs) . Gait Pattern: Step-through pattern;Decreased stride length General Gait Details: 1.4 ft/sec Stairs: No      PT Goals Acute Rehab PT Goals PT Goal: Ambulate - Progress: Progressing toward goal  Visit Information  Last PT Received On: 09/07/11 Assistance Needed: +1    Subjective Data  Subjective: "I need to wash my hair."   Cognition  Overall Cognitive Status: Appears within functional limits for tasks assessed/performed Arousal/Alertness: Awake/alert Orientation Level: Appears intact for tasks assessed Behavior  During Session: [facility] for tasks performed    Balance  Balance Balance Assessed: No  End of Session PT - End of Session Equipment Utilized During Treatment: Gait belt Activity Tolerance: Patient tolerated treatment well Patient left: in chair;with call bell/phone within reach Nurse Communication: Mobility status   GP     Hillary Schwegler 09/07/2011, 3:11 PM

## 2011-09-07 NOTE — Progress Notes (Signed)
Subjective: Tolerated clear liquid diet, diet advanced to full liquids today.  No specific concerns.  Objective: Vital signs in last 24 hours: Filed Vitals:   09/07/11 0150 09/07/11 0548 09/07/11 0752 09/07/11 1030  BP: 136/67 131/65  122/62  Pulse: 72 72  76  Temp: 98.5 F (36.9 C) 97.9 F (36.6 C)  98.1 F (36.7 C)  TempSrc: Oral Oral  Oral  Resp:  18  17  Height:      Weight:      SpO2: 98% 98% 98% 98%   Weight change:   Intake/Output Summary (Last 24 hours) at 09/07/11 1228 Last data filed at 09/07/11 0600  Gross per 24 hour  Intake   3078 ml  Output    402 ml  Net   2676 ml    Physical Exam: General: Awake, Oriented, No acute distress. HEENT: EOMI. Neck: Supple CV: S1 and S2 Lungs: Clear to ascultation bilaterally Abdomen: Soft, Nontender, Nondistended, +bowel sounds.  Abdominal incision noted. Ext: Good pulses. Trace edema.  Lab Results: Basic Metabolic Panel:  Lab 09/07/11 1610 September 11, 2011 0615 09/04/11 0400 09/03/11 0406 09/02/11 0500 09/01/11 0300  NA 138 137 134* 137 139 --  K 3.7 3.8 3.8 3.8 3.3* --  CL 101 100 98 101 104 --  CO2 30 28 30 29 27  --  GLUCOSE 125* 140* 161* 180* 212* --  BUN 17 17 14 11 14  --  CREATININE 0.45* 0.44* 0.40* 0.39* 0.45* --  CALCIUM 8.4 8.4 8.4 8.0* 8.2* --  MG 1.9 -- 1.9 2.0 1.8 1.8  PHOS 3.7 -- 2.9 2.3 1.4* 2.4   Liver Function Tests:  Lab 09/07/11 0535 09/04/11 0400 09/01/11 0300  AST 15 13 22   ALT 9 9 11   ALKPHOS 45 44 43  BILITOT 0.2* 0.2* 0.2*  PROT 4.9* 4.9* 4.6*  ALBUMIN 2.3* 2.2* 2.2*   No results found for this basename: LIPASE:5,AMYLASE:5 in the last 168 hours No results found for this basename: AMMONIA:5 in the last 168 hours CBC:  Lab 09/04/11 0400 09/01/11 0300  WBC 3.2* 2.5*  NEUTROABS 1.7 1.6*  HGB 10.9* 9.9*  HCT 32.4* 29.9*  MCV 93.6 94.3  PLT 194 160   Cardiac Enzymes: No results found for this basename: CKTOTAL:5,CKMB:5,CKMBINDEX:5,TROPONINI:5 in the last 168 hours BNP (last 3  results)  Basename 01/02/11 0211  PROBNP 321.0   CBG:  Lab 09/07/11 0816 09/07/11 0429 09/07/11 0024 2011/09/11 1944 2011/09/11 1634  GLUCAP 113* 113* 135* 159* 130*   No results found for this basename: HGBA1C:5 in the last 72 hours Other Labs: No components found with this basename: POCBNP:3 No results found for this basename: DDIMER:2 in the last 168 hours  Lab 09/04/11 0400 09/01/11 0300  CHOL 115 94  HDL -- --  LDLCALC -- --  TRIG 71 55  CHOLHDL -- --  LDLDIRECT -- --   No results found for this basename: TSH,T4TOTAL,FREET3,T3FREE,FREET4,THYROIDAB in the last 168 hours No results found for this basename: VITAMINB12:2,FOLATE:2,FERRITIN:2,TIBC:2,IRON:2,RETICCTPCT:2 in the last 168 hours  Micro Results: Recent Results (from the past 240 hour(s))  MRSA PCR SCREENING     Status: Abnormal   Collection Time   08/30/11 12:55 PM      Component Value Range Status Comment   MRSA by PCR POSITIVE (*) NEGATIVE Final     Studies/Results: Dg Abd 2 Views  09/11/2011  *RADIOLOGY REPORT*  Clinical Data: Small bowel obstruction.  Abdominal pain.  ABDOMEN - 2 VIEW  Comparison: None.  Findings: Nonobstructive bowel gas  pattern is present with multiple loops of gas distended small bowel.  Stool and bowel gas extends to the level of the rectosigmoid.  Gaseous distention of colon has decreased compared yesterday's examination.  Surgical staples are present in the midline.  IMPRESSION: Normalizing bowel gas pattern compatible with resolving ileus.  Original Report Authenticated By: Andreas Newport, M.D.    Medications: I have reviewed the patient's current medications. Scheduled Meds:    . antiseptic oral rinse  15 mL Mouth Rinse q12n4p  . arformoterol  15 mcg Nebulization Q12H  . budesonide  0.25 mg Nebulization BID  . chlorhexidine  15 mL Mouth Rinse BID  . digoxin  0.125 mg Intravenous Daily  . enoxaparin (LOVENOX) injection  30 mg Subcutaneous Q24H  . Glycerin (Adult)  1 suppository Rectal  Once  . insulin aspart  0-9 Units Subcutaneous Q4H  . magnesium sulfate 1 - 4 g bolus IVPB  1 g Intravenous Once  . metoprolol  5 mg Intravenous Q6H  . pantoprazole (PROTONIX) IV  40 mg Intravenous QHS  . potassium chloride  10 mEq Intravenous Q1 Hr x 3  . sodium chloride  10-40 mL Intracatheter Q12H  . tiotropium  18 mcg Inhalation QHS   Continuous Infusions:    . sodium chloride 20 mL/hr at 09/07/11 1004  . fat emulsion 192 mL (09/06/11 1756)  . TPN (CLINIMIX) +/- additives 60 mL/hr at 09/05/11 1835  . TPN (CLINIMIX) +/- additives 60 mL/hr at 09/06/11 1809  . TPN (CLINIMIX) +/- additives     PRN Meds:.acetaminophen, acetaminophen, dextrose, levalbuterol, LORazepam, morphine injection, ondansetron (ZOFRAN) IV, phenol, sodium chloride, sodium chloride  Assessment/Plan: SBO (small bowel obstruction)/postoperative ileus  S/P Laparotomy with Lysis of adhesion on 7/17. Post-op care per [facility]. PICC Line and TNA started on 7/19.  Diet advanced to full liquid diet.  Decrease TNA.  Hopefully will advanced to low residue diet tomorrow.  HTN (hypertension)  Restart patient on home antihypertensive medications, diltiazem and metoprolol at lower than dose.  Discontinue IV metoprolol.  Diabetes mellitus  Complicated by hypoglycemia on 7/17, CBG's stable since then.  Hemoglobin A1c 6.2 on 08/28/2011. Monitor CBG's, and continue SSI as needed. Patient being on TNA, hyperglycemia management is per protocol controlled by pharmacy.  Anxiety  Currently seems stable.  Continue benzodiazepines as needed.  COPD  Stable, no signs of acute exacerbation.  Continue spiriva. As needed Xopenex.  Prophylaxis Lovenox   Code Status Full Code   LOS: 10 days  Magaby Rumberger A, MD 09/07/2011, 12:28 PM

## 2011-09-07 NOTE — Progress Notes (Signed)
Much better  Advance diet

## 2011-09-07 NOTE — Progress Notes (Signed)
Patient ID: [name], female   DOB: 1931-04-03, 78 y.o.   MRN: [id number] 8 Days Post-Op  Subjective: Pt eating breakfast, tolerating well and feels hungry, walking well with PT, had BM yesterday, does report some more soreness since she is walking more.  Denies nausea or vomiting.  Objective: Vital signs in last 24 hours: Temp:  [97.9 F (36.6 C)-98.5 F (36.9 C)] 97.9 F (36.6 C) (07/25 0548) Pulse Rate:  [69-89] 72  (07/25 0548) Resp:  [16-18] 18  (07/25 0548) BP: (100-137)/(61-79) 131/65 mmHg (07/25 0548) SpO2:  [96 %-99 %] 98 % (07/25 0752) Last BM Date: 2011/09/19  Intake/Output from previous day: Sep 19, 2022 0701 - 07/25 0700 In: 3078 [P.O.:300; I.V.:1556; TPN:1222] Out: 402 [Urine:401; Stool:1] Intake/Output this shift:    PE: Abd: soft, mildly tender, +bs, incision d/c/i, staples in place  Lab Results:  No results found for this basename: WBC:2,HGB:2,HCT:2,PLT:2 in the last 72 hours BMET  [facility] 09/07/11 0535 19-Sep-2011 0615  NA 138 137  K 3.7 3.8  CL 101 100  CO2 30 28  GLUCOSE 125* 140*  BUN 17 17  CREATININE 0.45* 0.44*  CALCIUM 8.4 8.4   PT/INR No results found for this basename: LABPROT:2,INR:2 in the last 72 hours CMP     Component Value Date/Time   NA 138 09/07/2011 0535   K 3.7 09/07/2011 0535   CL 101 09/07/2011 0535   CO2 30 09/07/2011 0535   GLUCOSE 125* 09/07/2011 0535   BUN 17 09/07/2011 0535   CREATININE 0.45* 09/07/2011 0535   CALCIUM 8.4 09/07/2011 0535   PROT 4.9* 09/07/2011 0535   ALBUMIN 2.3* 09/07/2011 0535   AST 15 09/07/2011 0535   ALT 9 09/07/2011 0535   ALKPHOS 45 09/07/2011 0535   BILITOT 0.2* 09/07/2011 0535   GFRNONAA >90 09/07/2011 0535   GFRAA >90 09/07/2011 0535   Lipase     Component Value Date/Time   LIPASE 75* 03/02/2011 1749       Studies/Results: Dg Abd 2 Views  2011/09/19  *RADIOLOGY REPORT*  Clinical Data: Small bowel obstruction.  Abdominal pain.  ABDOMEN - 2 VIEW  Comparison: None.  Findings: Nonobstructive bowel  gas pattern is present with multiple loops of gas distended small bowel.  Stool and bowel gas extends to the level of the rectosigmoid.  Gaseous distention of colon has decreased compared yesterday's examination.  Surgical staples are present in the midline.  IMPRESSION: Normalizing bowel gas pattern compatible with resolving ileus.  Original Report Authenticated By: Andreas Newport, M.D.    Anti-infectives: Anti-infectives     Start     Dose/Rate Route Frequency Ordered Stop   08/30/11 2000   Ampicillin-Sulbactam (UNASYN) 3 g in sodium chloride 0.9 % 100 mL IVPB        3 g 100 mL/hr over 60 Minutes Intravenous Every 6 hours 08/30/11 1920 08/31/11 1517   08/30/11 1500   Ampicillin-Sulbactam (UNASYN) 3 g in sodium chloride 0.9 % 100 mL IVPB        3 g 100 mL/hr over 60 Minutes Intravenous  Once 08/30/11 1439 08/30/11 1500           Assessment/Plan  POD#8-Exploratory laparotomy with lysis of adhesions, excision of mesenteric mass 1. +BM with supp, will advance to full liquids at lunch today, will half TNA and plan to d/c this tomorrow.  Likely advance to soft diet tomorrow. 2. Working with PT and doing well overall.  Patient has 2 sons that will be helping her at home  so dispo will be home with her sons.  She does not feel she will need home health PT and she already has DME at home including walker, wheelchair and home O2. 3. Medical management per medicine team 4.  Staples out before discharge home.    LOS: 10 days    Kareem Aul 09/07/2011

## 2011-09-08 LAB — BASIC METABOLIC PANEL
CO2: 30 mEq/L (ref 19–32)
Calcium: 8.5 mg/dL (ref 8.4–10.5)
Chloride: 101 mEq/L (ref 96–112)
Creatinine, Ser: 0.49 mg/dL — ABNORMAL LOW (ref 0.50–1.10)
Glucose, Bld: 121 mg/dL — ABNORMAL HIGH (ref 70–99)

## 2011-09-08 LAB — MAGNESIUM: Magnesium: 2.1 mg/dL (ref 1.5–2.5)

## 2011-09-08 LAB — GLUCOSE, CAPILLARY
Glucose-Capillary: 124 mg/dL — ABNORMAL HIGH (ref 70–99)
Glucose-Capillary: 119 mg/dL — ABNORMAL HIGH (ref 70–99)

## 2011-09-08 MED ORDER — METOPROLOL TARTRATE 12.5 MG HALF TABLET: 12.5000 mg | ORAL_TABLET | Freq: Two times a day (BID) | ORAL | Status: DC

## 2011-09-08 MED ORDER — DILTIAZEM HCL ER COATED BEADS 120 MG PO CP24: 120.0000 mg | ORAL_CAPSULE | Freq: Every day | ORAL | Status: DC

## 2011-09-08 MED ORDER — PANTOPRAZOLE SODIUM 40 MG PO TBEC
40.0000 mg | DELAYED_RELEASE_TABLET | Freq: Every day | ORAL | Status: DC
  Administered 2011-09-08: 40 mg via ORAL
  Filled 2011-09-08: qty 1

## 2011-09-08 NOTE — Progress Notes (Signed)
PARENTERAL NUTRITION CONSULT NOTE - FOLLOW UP  Pharmacy Consult for TPN Indication: Prolonged Ileus  Allergies  Allergen Reactions  . Epinephrine Other (See Comments)    Patient thinks her heart stopped and the MD said never to take this again.  Berle Mull Dye (Iodinated Diagnostic Agents) Other (See Comments)    Unknown-patient said she thinks her heart stopped, but she really can't remember  . Albuterol Sulfate Nausea And Vomiting    Pt reports she is able to tolerate the albuterol hfa, not the nebulizer solution.  Jonne Ply (Aspirin) Other (See Comments)    Heart doctor told her to take advil instead   Patient Measurements: Height: 5\' 2"  (157.5 cm) Weight: 102 lb 8 oz (46.494 kg) IBW/kg (Calculated) : 50.1   Vital Signs: Temp: 98.4 F (36.9 C) (07/26 0552) Temp src: Oral (07/26 0552) BP: 123/68 mmHg (07/26 0552) Pulse Rate: 73  (07/26 0552) Intake/Output from previous day: 07/25 0701 - 07/26 0700 In: 3613 [I.V.:1417; IV Piggyback:250; TPN:1946] Out: -   Labs: No results found for this basename: WBC:3,HGB:3,HCT:3,PLT:3,APTT:3,INR:3 in the last 72 hours   Basename 09/08/11 0500 09/07/11 0535 09/06/11 0615  NA 136 138 137  K 4.0 3.7 3.8  CL 101 101 100  CO2 30 30 28   GLUCOSE 121* 125* 140*  BUN 17 17 17   CREATININE 0.49* 0.45* 0.44*  CALCIUM 8.5 8.4 8.4  MG 2.1 1.9 --  PHOS -- 3.7 --  PROT -- 4.9* --  ALBUMIN -- 2.3* --  AST -- 15 --  ALT -- 9 --  ALKPHOS -- 45 --  BILITOT -- 0.2* --   Estimated Creatinine Clearance: 42.5 ml/min (by C-G formula based on Cr of 0.49).   Basename 09/08/11 0413 09/08/11 0002 09/07/11 1958  GLUCAP 124* 142* 145*   Insulin Requirements in the past 24 hours:  3 units SSI with 18 units insulin in TPN on sensitive SSI q4h  Current Nutrition:  Clinimix E5/15 at 60 ml/hr, Lipids MWF only at 8 mlhr (at goal).  Clear liquids yesterday - advanced - tolerated.  Plan is to discontinue TNA after today  Assessment: TPN continues for slowly  resolving ileus s/p ex lap with LOA for SBO.  Tolerating diet without emesis and now has returning bowel functioning.  PICC line - IV site, clean, dry and infusing without complications per flow-sheet at 05:40AM today.  GI: Tolerating clear liquids without emesis - plans to advance diet today.  She is receiving Protonix IV - She was not on this PTA and could likely be discontinued with her TNA.  Endo: Hx of DM, currently on SSI, insulin in TPN bag. CBG controlled and < 150.  She has not had any hypoglycemic episodes.   Lytes: Lytes stable; In setting of ileus, target K ~4, Mg ~2 and within desired range. Renal: Scr stable, clearance is estimated at ~ 38ml/min. Noted TPN+MIVF provides 107ml/kg/hr Pulm: RA Cards: VSS, noted cont on home dig.  Hepatobil: LFTs WNL, prealbumin low for goal 18-40 but trending up slightly 9.8>10.6. Trig and Chol WNL. ID: Afeb, WBC low   Best Practices: LMWH, IV PPI, MC  Nutritional Goals:  1400-1600 kCal, 65-75 grams of protein per day  Plan:  - Noted plans to discontinue TNA - will discontinue standing lab orders - She may require a bump in her SSI to moderate regimen with change.  Let us know if we can assist further in the care of this patient.  Thank you for allowing pharmacy to be  a part of the care team.  Nadara Mustard, PharmD., MS Clinical Pharmacist Pager:  [phone]  09/08/2011 8:00 AM

## 2011-09-08 NOTE — Progress Notes (Signed)
Physical Therapy Treatment Patient Details Name: [name] MRN: [id number] DOB: [birth year] Today's Date: 09/08/2011 Time: 1345-1401 PT Time Calculation (min): 16 min  PT Assessment / Plan / Recommendation Comments on Treatment Session  Patient starting to ambulate in hall with nursing. I took over with ambulation so patient could practice without RW as I doubt she will use when she gets home. Patient declined going to practice steps.  Patient educated on overall safety awareness once home. Patient states her son helps her and that she has great neighbors that she can call at anytime.     Follow Up Recommendations  No PT follow up    Barriers to Discharge        Equipment Recommendations  None recommended by PT    Recommendations for Other Services    Frequency Min 3X/week   Plan Discharge plan remains appropriate;Frequency remains appropriate    Precautions / Restrictions Precautions Precautions: None Other Brace/Splint: abdominal binder   Pertinent Vitals/Pain     Mobility  Transfers Sit to Stand: 7: Independent Stand to Sit: 7: Independent Ambulation/Gait Ambulation/Gait Assistance: 6: Modified independent (Device/Increase time) Ambulation Distance (Feet): 150 Feet Assistive device: None Ambulation/Gait Assistance Details: No staggering or LOB Gait Pattern: Within Functional Limits    Exercises     PT Diagnosis:    PT Problem List:   PT Treatment Interventions:     PT Goals Acute Rehab PT Goals PT Goal: Ambulate - Progress: Progressing toward goal PT Goal: Up/Down Stairs - Progress:  (patient refused practicing)  Visit Information  Last PT Received On: 09/08/11 Assistance Needed: +1    Subjective Data      Cognition  Overall Cognitive Status: Appears within functional limits for tasks assessed/performed Arousal/Alertness: Awake/alert Orientation Level: Appears intact for tasks assessed Behavior During Session: [facility] for tasks performed    Balance     End of Session PT - End of Session Equipment Utilized During Treatment: Gait belt Activity Tolerance: Patient tolerated treatment well Patient left: in bed;with call bell/phone within reach Nurse Communication: Mobility status   GP     Fredrich Birks 09/08/2011, 2:55 PM 09/08/2011 Fredrich Birks PTA [phone] pager [phone] office

## 2011-09-08 NOTE — Progress Notes (Signed)
Subjective: Tolerating regular diet.  No specific concerns.  Objective: Vital signs in last 24 hours: Filed Vitals:   09/07/11 2022 09/07/11 2138 09/08/11 0552 09/08/11 0901  BP:  138/68 123/68   Pulse:  79 73   Temp:  98.2 F (36.8 C) 98.4 F (36.9 C)   TempSrc:  Oral Oral   Resp:  20 17   Height:      Weight:      SpO2: 99% 100% 99% 100%   Weight change:   Intake/Output Summary (Last 24 hours) at 09/08/11 1107 Last data filed at 09/07/11 2318  Gross per 24 hour  Intake   3513 ml  Output      0 ml  Net   3513 ml    Physical Exam: General: Awake, Oriented, No acute distress. HEENT: EOMI. Neck: Supple CV: S1 and S2 Lungs: Clear to ascultation bilaterally Abdomen: Soft, Nontender, Nondistended, +bowel sounds.  Abdominal incision noted. Ext: Good pulses. Trace edema.  Lab Results: Basic Metabolic Panel:  Lab 09/08/11 1610 09/07/11 0535 Oct 02, 2011 0615 09/04/11 0400 09/03/11 0406 09/02/11 0500  NA 136 138 137 134* 137 --  K 4.0 3.7 3.8 3.8 3.8 --  CL 101 101 100 98 101 --  CO2 30 30 28 30 29  --  GLUCOSE 121* 125* 140* 161* 180* --  BUN 17 17 17 14 11  --  CREATININE 0.49* 0.45* 0.44* 0.40* 0.39* --  CALCIUM 8.5 8.4 8.4 8.4 8.0* --  MG 2.1 1.9 -- 1.9 2.0 1.8  PHOS -- 3.7 -- 2.9 2.3 1.4*   Liver Function Tests:  Lab 09/07/11 0535 09/04/11 0400  AST 15 13  ALT 9 9  ALKPHOS 45 44  BILITOT 0.2* 0.2*  PROT 4.9* 4.9*  ALBUMIN 2.3* 2.2*   No results found for this basename: LIPASE:5,AMYLASE:5 in the last 168 hours No results found for this basename: AMMONIA:5 in the last 168 hours CBC:  Lab 09/04/11 0400  WBC 3.2*  NEUTROABS 1.7  HGB 10.9*  HCT 32.4*  MCV 93.6  PLT 194   Cardiac Enzymes: No results found for this basename: CKTOTAL:5,CKMB:5,CKMBINDEX:5,TROPONINI:5 in the last 168 hours BNP (last 3 results)  Basename 01/02/11 0211  PROBNP 321.0   CBG:  Lab 09/08/11 0413 09/08/11 0002 09/07/11 1958 09/07/11 1630 09/07/11 1226  GLUCAP 124* 142* 145*  130* 136*   No results found for this basename: HGBA1C:5 in the last 72 hours Other Labs: No components found with this basename: POCBNP:3 No results found for this basename: DDIMER:2 in the last 168 hours  Lab 09/04/11 0400  CHOL 115  HDL --  LDLCALC --  TRIG 71  CHOLHDL --  LDLDIRECT --   No results found for this basename: TSH,T4TOTAL,FREET3,T3FREE,FREET4,THYROIDAB in the last 168 hours No results found for this basename: VITAMINB12:2,FOLATE:2,FERRITIN:2,TIBC:2,IRON:2,RETICCTPCT:2 in the last 168 hours  Micro Results: Recent Results (from the past 240 hour(s))  MRSA PCR SCREENING     Status: Abnormal   Collection Time   08/30/11 12:55 PM      Component Value Range Status Comment   MRSA by PCR POSITIVE (*) NEGATIVE Final     Studies/Results: Dg Abd 2 Views  Oct 02, 2011  *RADIOLOGY REPORT*  Clinical Data: Small bowel obstruction.  Abdominal pain.  ABDOMEN - 2 VIEW  Comparison: None.  Findings: Nonobstructive bowel gas pattern is present with multiple loops of gas distended small bowel.  Stool and bowel gas extends to the level of the rectosigmoid.  Gaseous distention of colon has decreased compared yesterday's  examination.  Surgical staples are present in the midline.  IMPRESSION: Normalizing bowel gas pattern compatible with resolving ileus.  Original Report Authenticated By: Andreas Newport, M.D.    Medications: I have reviewed the patient's current medications. Scheduled Meds:    . antiseptic oral rinse  15 mL Mouth Rinse q12n4p  . arformoterol  15 mcg Nebulization Q12H  . budesonide  0.25 mg Nebulization BID  . chlorhexidine  15 mL Mouth Rinse BID  . digoxin  125 mcg Oral Daily  . diltiazem  120 mg Oral Daily  . enoxaparin (LOVENOX) injection  30 mg Subcutaneous Q24H  . insulin aspart  0-9 Units Subcutaneous Q4H  . metoprolol tartrate  12.5 mg Oral BID  . montelukast  10 mg Oral Daily  . pantoprazole  40 mg Oral Q1200  . potassium chloride  10 mEq Intravenous Q1 Hr x  3  . pravastatin  40 mg Oral QPC supper  . sodium chloride  10-40 mL Intracatheter Q12H  . tiotropium  18 mcg Inhalation QHS  . DISCONTD: digoxin  0.125 mg Intravenous Daily  . DISCONTD: metoprolol  5 mg Intravenous Q6H  . DISCONTD: pantoprazole (PROTONIX) IV  40 mg Intravenous QHS  . DISCONTD: simvastatin  20 mg Oral q1800   Continuous Infusions:    . fat emulsion 192 mL (09/06/11 1756)  . TPN (CLINIMIX) +/- additives 60 mL/hr at 09/06/11 1809  . DISCONTD: sodium chloride 20 mL/hr at 09/07/11 1500  . DISCONTD: TPN (CLINIMIX) +/- additives 60 mL/hr at 09/07/11 1728   PRN Meds:.acetaminophen, acetaminophen, dextrose, levalbuterol, LORazepam, morphine injection, ondansetron (ZOFRAN) IV, phenol, sodium chloride, sodium chloride  Assessment/Plan: SBO (small bowel obstruction)/postoperative ileus  S/P Laparotomy with Lysis of adhesion on 7/17. Post-op care per [facility]. PICC Line and TNA started on 7/19.  Diet advanced to regular diet. DC TNA.   HTN (hypertension)  Restarted patient on home antihypertensive medications, diltiazem and metoprolol at lower than home dose.  Diabetes mellitus  Complicated by hypoglycemia on 7/17, CBG's stable since then.  Hemoglobin A1c 6.2 on 08/28/2011. Monitor CBG's, and continue SSI as needed. Patient being on TNA, hyperglycemia management is per protocol controlled by pharmacy. Hemoglobin A1C 6.2 on 08/28/2011, appears to be diet controlled, not on any diabetic agents at home. Further management as outpatient.  Anxiety  Currently seems stable.  Continue benzodiazepines as needed.  COPD  Stable, no signs of acute exacerbation.  Continue spiriva. As needed Xopenex.  Prophylaxis Lovenox   Code Status Full Code  Disposition Discharge the patient home.   LOS: 11 days  Bharat Antillon A, MD 09/08/2011, 11:07 AM

## 2011-09-08 NOTE — Discharge Summary (Signed)
Physician Discharge Summary  [name]:[id number] DOB: 07/28/1931 DOA: 08/28/2011  PCP: Cain Saupe, MD  Admit date: 08/28/2011 Discharge date: 09/08/2011  Recommendations for Outpatient Follow-up:  1. Patient was instructed to follow up with her primary care physician for adjustment of her antihypertensive medications and postoperative follow up. 2. Followup with general surgery in 2 weeks.  Discharge Diagnoses:  Principal Problem:  *SBO (small bowel obstruction) Active Problems:  AODM  COPD  HTN (hypertension)  LV dysfunction  Diabetes mellitus  Hyponatremia  Anxiety  A-fib  Prerenal acute renal failure  Hypotension  Nausea and vomiting  Discharge Condition: Stable  Diet recommendation: Diabetic diet  History of present illness:  On admission: "This is a 78 y/o lady with a H/O DM who presents to the ED with C/O nausea and abdominal pain for 3 days.Pt states that she went out and ate salad. She then started having abdominal pain and nausea but no emesis. She descibes the pain as a cramping pain which extends from epigastrium to suprapubic region but is non-radiating. She rates pain as a 7/10 at it's worse, and is unable to identify any palliative or provocative features. Pt had 1 episode of emesis after arriving in the ED which she describes as bilious but non-hematenatous in nature.   On radiologic examination in the ED, pt had findings consistent with SBO. She was also found to be hypotensive and hyponatremic in the setting of elevated BUN and Cr.   Review of pt's medical record shows a history of SVT S/P ablation and a h/o unexplained syncope with implantable loop recorder. Pt also underwent a left heart cath and angioiography on 02/20/2011 with no findings of CAD And with normal LVF. Pt also has a H/O PAF but is not on anticoagulation. Pt has a history of diet controlled DM II but states that her sugars have been well controlled."  Hospital Course:  SBO (small bowel  obstruction)/postoperative ileus  S/P Laparotomy with Lysis of adhesion on 7/17. Post-op care per [facility]. patient's course was complicated by ileus after surgery.  Patient required a PICC line placement with TNA which was started on 7/19.  Patient was started on a clear liquid diet on 09/06/2011 and diet was advanced as tolerated to regular diet. Prior to discharge patient was tolerating solid food.    HTN (hypertension)  As the patient was n.p.o., initially patient was only on IV metoprolol which was transitioned back to home antihypertensive medications, diltiazem and metoprolol but at lower than home dose. Patient's losartan has been discontinued during the course of hospital stay, will defer to her primary care physician for further titration of antihypertensive medications.  Diabetes mellitus  Complicated by hypoglycemia on 7/17, CBG's stable since then.  Hemoglobin A1c 6.2 on 08/28/2011. Monitor CBG's, and continue SSI as needed. Patient being on TNA, hyperglycemia management is per protocol controlled by pharmacy. Hemoglobin A1C 6.2 on 08/28/2011, appears to be diet controlled, not on any diabetic agents at home. Further management as outpatient.  Anxiety  Currently seems stable.  Continue benzodiazepines as needed.  COPD  Stable, no signs of acute exacerbation.  Continue spiriva. As needed Xopenex.  Procedures:  As above.  Consultations:  General surgery  Discharge Exam: Filed Vitals:   09/08/11 0552  BP: 123/68  Pulse: 73  Temp: 98.4 F (36.9 C)  Resp: 17   Filed Vitals:   09/07/11 2022 09/07/11 2138 09/08/11 0552 09/08/11 0901  BP:  138/68 123/68   Pulse:  79 73  Temp:  98.2 F (36.8 C) 98.4 F (36.9 C)   TempSrc:  Oral Oral   Resp:  20 17   Height:      Weight:      SpO2: 99% 100% 99% 100%   Discharge Instructions  Discharge Orders    Future Appointments: Provider: Department: Dept Phone: Center:   10/20/2011 10:30 AM Marinus Maw, MD [facility]  [phone] [facility]   12/26/2011 9:30 AM Newt Lukes, MD [facility] [phone] [facility]     Future Orders Please Complete By Expires   Diet Carb Modified      Increase activity slowly      Discharge instructions      Comments:   Followup with FULP, CAMMIE, MD (PCP) in 1 week, please have your blood pressure checked to determine if you blood pressure needs to be adjusted. Followup with Dr. Dwain Sarna (general surgery) in 2 weeks.     Medication List  As of 09/08/2011 11:22 AM   STOP taking these medications         losartan 50 MG tablet         TAKE these medications         budesonide 0.25 MG/2ML nebulizer solution   Commonly known as: PULMICORT   Take 0.25 mg by nebulization 2 (two) times daily.      digoxin 0.125 MG tablet   Commonly known as: LANOXIN   Take 1 tablet by mouth daily.      diltiazem 120 MG 24 hr capsule   Commonly known as: CARDIZEM CD   Take 1 capsule (120 mg total) by mouth daily.      ibuprofen 200 MG tablet   Commonly known as: ADVIL,MOTRIN   Take 200-400 mg by mouth every 6 (six) hours as needed. For pain      metoprolol tartrate 12.5 mg Tabs   Commonly known as: LOPRESSOR   Take 0.5 tablets (12.5 mg total) by mouth 2 (two) times daily.      montelukast 10 MG tablet   Commonly known as: SINGULAIR   Take 1 tablet by mouth daily.      PERFOROMIST 20 MCG/2ML nebulizer solution   Generic drug: formoterol   Take 20 mcg by nebulization 2 (two) times daily as needed. Shortness of breath      pravastatin 40 MG tablet   Commonly known as: PRAVACHOL   Take 40 mg by mouth daily.      PROAIR HFA 108 (90 BASE) MCG/ACT inhaler   Generic drug: albuterol   INHALE 2 PUFFS 4 TIMES A DAY AS NEEDED      promethazine-codeine 6.25-10 MG/5ML syrup   Commonly known as: PHENERGAN with CODEINE   Take 5 mLs by mouth every 4 (four) hours as needed. For cough      tiotropium 18 MCG inhalation capsule   Commonly known as: SPIRIVA   Place 18 mcg into inhaler  and inhale at bedtime.      Vitamin D3 50000 UNITS Caps   Take 50,000 Units by mouth once a week. On Sundays           Follow-up Information    Follow up with [facility], MD. Schedule an appointment as soon as possible for a visit in 2 weeks.   Contact information:   [facility] [address] [place] [address] [phone]       Follow up with FULP, CAMMIE, MD. Schedule an appointment as soon as possible  for a visit in 1 week.   Contact information:   [address] [place] [address] [place] [address] [phone]           The results of significant diagnostics from this hospitalization (including imaging, microbiology, ancillary and laboratory) are listed below for reference.    Significant Diagnostic Studies: Dg Abd 2 Views  09/06/2011  *RADIOLOGY REPORT*  Clinical Data: Small bowel obstruction.  Abdominal pain.  ABDOMEN - 2 VIEW  Comparison: None.  Findings: Nonobstructive bowel gas pattern is present with multiple loops of gas distended small bowel.  Stool and bowel gas extends to the level of the rectosigmoid.  Gaseous distention of colon has decreased compared yesterday's examination.  Surgical staples are present in the midline.  IMPRESSION: Normalizing bowel gas pattern compatible with resolving ileus.  Original Report Authenticated By: Andreas Newport, M.D.   Dg Abd 2 Views  09/04/2011  *RADIOLOGY REPORT*  Clinical Data: Abdominal pain  ABDOMEN - 2 VIEW  Comparison: 08/30/2011  Findings: Laparotomy staples in the midline.  Mildly dilated large and small bowel.  Improvement in small bowel dilatation since the prior study.  Current pattern is most consistent with postop ileus. No free air.  Small bilateral pleural effusions.  IMPRESSION: Mild postop ileus.  Original Report Authenticated By: Camelia Phenes, M.D.   Dg Abd 2 Views  08/30/2011  *RADIOLOGY REPORT*  Clinical Data: Nausea and vomiting, abdominal pain   ABDOMEN - 2 VIEW  Comparison: August 29, 2011.  Findings: Nasogastric tube tip is in expected position of distal stomach or proximal duodenum. Several dilated loops of small bowel are noted with air fluid levels which are slightly more prominent compared to prior exam.  Phleboliths are noted in the pelvis. Probable small amount of stool identified in the rectum.  IMPRESSION: Mildly dilated small bowel loops with air fluid levels are again noted and appear slightly more prominent compared to prior exam. This is suspicious for partial small bowel obstruction although ileus is also a possibility.  Original Report Authenticated By: Venita Sheffield., M.D.   Dg Abd Acute W/chest  08/28/2011  *RADIOLOGY REPORT*  Clinical Data: Mid abdominal pain, nausea, abdominal distention  ACUTE ABDOMEN SERIES (ABDOMEN 2 VIEW & CHEST 1 VIEW)  Comparison: 02/24/2011  Findings: Loop recorder projects over left chest. Normal heart size, mediastinal contours, and pulmonary vascularity. Emphysematous changes with chronic peribronchial thickening right base scarring. No pulmonary infiltrate, pleural effusion or pneumothorax. Diffuse osseous demineralization. Small amount gas and stool within colon. Air filled loops of minimally distended small bowel with air-fluid levels on upright view new since previous exam question small bowel obstruction. No definite bowel wall thickening or free intraperitoneal air. Pelvic phleboliths and a few scattered vascular calcifications. Degenerative changes left hip. No urinary tract calcification.  IMPRESSION: Air filled mildly distended small bowel loops with air-fluid levels on upright view new versus previous exam question small bowel obstruction. COPD.  Original Report Authenticated By: Lollie Marrow, M.D.   Dg Abd Portable 1v  08/29/2011  *RADIOLOGY REPORT*  Clinical Data: Ileus and evaluate NG tube placement.  PORTABLE ABDOMEN - 1 VIEW  Comparison: 08/28/2011  Findings: Nasogastric tube terminates  at the body of the stomach. Proximal mid small bowel loops remain dilated.  Up to 3.5 cm. Relative paucity of distal gas.  There  is a moderate amount of ascending colonic stool. No pneumatosis or free intraperitoneal air.  Probable rectal gas.  Phleboliths in the pelvis.  IMPRESSION:  1.  Nasogastric  terminating at body of stomach. 2.  Similar small bowel dilatation, suspicious for partial small bowel obstruction.  Localized adynamic ileus could look similar.  Original Report Authenticated By: Consuello Bossier, M.D.    Microbiology: Recent Results (from the past 240 hour(s))  MRSA PCR SCREENING     Status: Abnormal   Collection Time   08/30/11 12:55 PM      Component Value Range Status Comment   MRSA by PCR POSITIVE (*) NEGATIVE Final      Labs: Basic Metabolic Panel:  Lab 09/08/11 1308 09/07/11 0535 09/06/11 0615 09/04/11 0400 09/03/11 0406 09/02/11 0500  NA 136 138 137 134* 137 --  K 4.0 3.7 3.8 3.8 3.8 --  CL 101 101 100 98 101 --  CO2 30 30 28 30 29  --  GLUCOSE 121* 125* 140* 161* 180* --  BUN 17 17 17 14 11  --  CREATININE 0.49* 0.45* 0.44* 0.40* 0.39* --  CALCIUM 8.5 8.4 8.4 8.4 8.0* --  MG 2.1 1.9 -- 1.9 2.0 1.8  PHOS -- 3.7 -- 2.9 2.3 1.4*   Liver Function Tests:  Lab 09/07/11 0535 09/04/11 0400  AST 15 13  ALT 9 9  ALKPHOS 45 44  BILITOT 0.2* 0.2*  PROT 4.9* 4.9*  ALBUMIN 2.3* 2.2*   No results found for this basename: LIPASE:5,AMYLASE:5 in the last 168 hours No results found for this basename: AMMONIA:5 in the last 168 hours CBC:  Lab 09/04/11 0400  WBC 3.2*  NEUTROABS 1.7  HGB 10.9*  HCT 32.4*  MCV 93.6  PLT 194   Cardiac Enzymes: No results found for this basename: CKTOTAL:5,CKMB:5,CKMBINDEX:5,TROPONINI:5 in the last 168 hours BNP: BNP (last 3 results)  Basename 01/02/11 0211  PROBNP 321.0   CBG:  Lab 09/08/11 0805 09/08/11 0413 09/08/11 0002 09/07/11 1958 09/07/11 1630  GLUCAP 119* 124* 142* 145* 130*    Time coordinating discharge:  35  Signed:  Merrik Puebla A  [facility] 09/08/2011, 11:22 AM

## 2011-09-08 NOTE — Progress Notes (Signed)
Discharge patient home.

## 2011-09-08 NOTE — Progress Notes (Signed)
PHARMACIST - PHYSICIAN COMMUNICATION  CONCERNING: Protonix IV to Oral Route Change Policy  RECOMMENDATION: This patient is receiving Protonix by the intravenous route.  Based on criteria approved by the Pharmacy and Therapeutics Committee, this is being converted to the equivalent oral dose form(s).   DESCRIPTION: These criteria include:  The patient is not neutropenic and does not exhibit a GI malabsorption state. Did not have GIB this admit.  The patient is eating (either orally or via tube) and/or has been taking other orally administered medications for a least 24 hours  If you have questions about this conversion, please contact the Pharmacy Department  Saheed Carrington K. Allena Katz, PharmD, BCPS.  Clinical Pharmacist Pager [phone]. 09/08/2011 8:40 AM

## 2011-09-14 DIAGNOSIS — J449 Chronic obstructive pulmonary disease, unspecified: Secondary | ICD-10-CM | POA: Diagnosis not present

## 2011-09-14 DIAGNOSIS — E119 Type 2 diabetes mellitus without complications: Secondary | ICD-10-CM | POA: Diagnosis not present

## 2011-09-14 DIAGNOSIS — R1084 Generalized abdominal pain: Secondary | ICD-10-CM | POA: Diagnosis not present

## 2011-09-14 DIAGNOSIS — E785 Hyperlipidemia, unspecified: Secondary | ICD-10-CM | POA: Diagnosis not present

## 2011-09-14 DIAGNOSIS — D649 Anemia, unspecified: Secondary | ICD-10-CM | POA: Diagnosis not present

## 2011-09-14 DIAGNOSIS — I10 Essential (primary) hypertension: Secondary | ICD-10-CM | POA: Diagnosis not present

## 2011-09-14 DIAGNOSIS — Z22322 Carrier or suspected carrier of Methicillin resistant Staphylococcus aureus: Secondary | ICD-10-CM | POA: Diagnosis not present

## 2011-09-14 DIAGNOSIS — J4489 Other specified chronic obstructive pulmonary disease: Secondary | ICD-10-CM | POA: Diagnosis not present

## 2011-09-14 DIAGNOSIS — E871 Hypo-osmolality and hyponatremia: Secondary | ICD-10-CM | POA: Diagnosis not present

## 2011-09-16 ENCOUNTER — Other Ambulatory Visit: Payer: Self-pay | Admitting: Internal Medicine

## 2011-09-22 NOTE — ED Provider Notes (Signed)
Medical screening examination/treatment/procedure(s) were performed by non-physician practitioner and as supervising physician I was immediately available for consultation/collaboration.  Cheri Guppy, MD 09/22/11 [phone]

## 2011-09-28 DIAGNOSIS — R7989 Other specified abnormal findings of blood chemistry: Secondary | ICD-10-CM | POA: Diagnosis not present

## 2011-09-29 ENCOUNTER — Ambulatory Visit (INDEPENDENT_AMBULATORY_CARE_PROVIDER_SITE_OTHER): Payer: Medicare Other | Admitting: General Surgery

## 2011-09-29 ENCOUNTER — Encounter (INDEPENDENT_AMBULATORY_CARE_PROVIDER_SITE_OTHER): Payer: Self-pay | Admitting: General Surgery

## 2011-09-29 VITALS — BP 110/60 | HR 76 | Resp 18 | Ht 62.0 in | Wt 93.0 lb

## 2011-09-29 DIAGNOSIS — Z09 Encounter for follow-up examination after completed treatment for conditions other than malignant neoplasm: Secondary | ICD-10-CM

## 2011-09-29 NOTE — Progress Notes (Signed)
Subjective:     Patient ID: [name], female   DOB: [birth year], 78 y.o.   MRN: [id number]  HPI This is a 78 year old female with multiple medical problems who I met in the hospital. I took her to the operating room for a small bowel obstruction with peritoneal signs. She underwent a lysis of adhesions with an excision of a benign mesenteric cyst as well. She was in the hospital for a while afterwards. She then she was discharged home. She returns today doing well without any real significant complaints. She was like to take her abdominal binder off. She is having bowel movements daily with the help of some laxatives which is her norm. She is eating without any nausea and vomiting.  Review of Systems     Objective:   Physical Exam Well healed incision without infection    Assessment:     Status post exploratory laparotomy for small bowel obstruction with lysis of adhesions    Plan:     She's doing well. I told her she could begin returning to normal activity. She's going to come back and see me as needed.

## 2011-10-09 ENCOUNTER — Ambulatory Visit (INDEPENDENT_AMBULATORY_CARE_PROVIDER_SITE_OTHER)
Admission: RE | Admit: 2011-10-09 | Discharge: 2011-10-09 | Disposition: A | Discharge status: Home / Self Care | Payer: Medicare Other | Source: Ambulatory Visit | Attending: Internal Medicine | Admitting: Internal Medicine

## 2011-10-09 ENCOUNTER — Ambulatory Visit (INDEPENDENT_AMBULATORY_CARE_PROVIDER_SITE_OTHER): Payer: Medicare Other | Admitting: Internal Medicine

## 2011-10-09 ENCOUNTER — Encounter: Payer: Self-pay | Admitting: Internal Medicine

## 2011-10-09 VITALS — BP 118/62 | HR 68 | Ht 62.0 in | Wt 93.8 lb

## 2011-10-09 DIAGNOSIS — J42 Unspecified chronic bronchitis: Secondary | ICD-10-CM

## 2011-10-09 DIAGNOSIS — J309 Allergic rhinitis, unspecified: Secondary | ICD-10-CM | POA: Diagnosis not present

## 2011-10-09 DIAGNOSIS — R911 Solitary pulmonary nodule: Secondary | ICD-10-CM

## 2011-10-09 DIAGNOSIS — J438 Other emphysema: Secondary | ICD-10-CM | POA: Diagnosis not present

## 2011-10-09 MED ORDER — TRAMADOL HCL 50 MG PO TABS: ORAL_TABLET | ORAL | Status: DC
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
## 2011-10-09 NOTE — Patient Instructions (Addendum)
2 samples of Qnasl  Nasal spray- 2 puffs each nostril once every night at bedtime  Order- CXR  - dx chronic bronchitis  Script sent for pain medicine tramadol for chronic left hip pain

## 2011-10-15 NOTE — Assessment & Plan Note (Signed)
Perennial nasal congestion without visible polyps. Plan-sample Qnasl steroid spray discussion

## 2011-10-15 NOTE — Assessment & Plan Note (Signed)
Imaging would be consistent with Mycobacterium avium/atypical AFB. We discussed this carefully. She is producing no sputum. We will follow chest x-ray.

## 2011-10-17 NOTE — Progress Notes (Signed)
Quick Note:  Pt aware of results. ______ 

## 2011-10-20 ENCOUNTER — Encounter: Payer: Self-pay | Admitting: Internal Medicine

## 2011-10-20 ENCOUNTER — Ambulatory Visit (INDEPENDENT_AMBULATORY_CARE_PROVIDER_SITE_OTHER): Payer: Medicare Other | Admitting: Internal Medicine

## 2011-10-20 VITALS — BP 106/66 | HR 72 | Ht 62.0 in | Wt 94.8 lb

## 2011-10-20 DIAGNOSIS — R55 Syncope and collapse: Secondary | ICD-10-CM | POA: Diagnosis not present

## 2011-10-20 DIAGNOSIS — I498 Other specified cardiac arrhythmias: Secondary | ICD-10-CM | POA: Diagnosis not present

## 2011-10-20 DIAGNOSIS — I4891 Unspecified atrial fibrillation: Secondary | ICD-10-CM | POA: Diagnosis not present

## 2011-10-20 DIAGNOSIS — I471 Supraventricular tachycardia: Secondary | ICD-10-CM

## 2011-10-20 NOTE — Assessment & Plan Note (Signed)
No evidence of recurrent symptoms. We will follow.

## 2011-10-20 NOTE — Patient Instructions (Addendum)
Your physician recommends that you schedule a follow-up appointment in: 3 months with device clinic and 6 months with Dr. Taylor.  

## 2011-10-20 NOTE — Assessment & Plan Note (Signed)
She has had no recurrent syncope. Interrogation of her implantable loop recorder demonstrates no arrhythmias which could cause syncope.

## 2011-10-20 NOTE — Assessment & Plan Note (Signed)
She has had no recurrent document arrhythmias. We'll follow.
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
## 2011-11-09 ENCOUNTER — Other Ambulatory Visit: Payer: Self-pay | Admitting: Internal Medicine

## 2011-11-09 NOTE — Telephone Encounter (Signed)
Ok to refill 

## 2011-11-09 NOTE — Telephone Encounter (Signed)
Please advise if okay to refill. Thanks.  

## 2011-11-10 ENCOUNTER — Telehealth: Payer: Self-pay | Admitting: Internal Medicine

## 2011-11-10 NOTE — Telephone Encounter (Signed)
This has been called to her pharmacy; had to wait for CY to approve med refill.  Spoke with patient-she is aware of Rx called to pharmacy.

## 2011-11-10 NOTE — Telephone Encounter (Signed)
Pt is requesting refill on her promethazine w/ codeine cough syrup. Pt last seen 10/09/11. Has pending appt 11/28/11. Please advise Dr. Maple Hudson thanks

## 2011-11-10 NOTE — Telephone Encounter (Signed)
Spoke with patient-she will call her PCP as they are the ones to monitor and treat BP issues.

## 2011-11-24 DIAGNOSIS — E119 Type 2 diabetes mellitus without complications: Secondary | ICD-10-CM | POA: Diagnosis not present

## 2011-11-24 DIAGNOSIS — E785 Hyperlipidemia, unspecified: Secondary | ICD-10-CM | POA: Diagnosis not present

## 2011-11-24 DIAGNOSIS — E559 Vitamin D deficiency, unspecified: Secondary | ICD-10-CM | POA: Diagnosis not present

## 2011-11-27 DIAGNOSIS — E119 Type 2 diabetes mellitus without complications: Secondary | ICD-10-CM | POA: Diagnosis not present

## 2011-11-27 DIAGNOSIS — I10 Essential (primary) hypertension: Secondary | ICD-10-CM | POA: Diagnosis not present

## 2011-11-27 DIAGNOSIS — E559 Vitamin D deficiency, unspecified: Secondary | ICD-10-CM | POA: Diagnosis not present

## 2011-11-27 DIAGNOSIS — E785 Hyperlipidemia, unspecified: Secondary | ICD-10-CM | POA: Diagnosis not present

## 2011-11-28 ENCOUNTER — Encounter: Payer: Self-pay | Admitting: Internal Medicine

## 2011-11-28 ENCOUNTER — Ambulatory Visit (INDEPENDENT_AMBULATORY_CARE_PROVIDER_SITE_OTHER): Payer: Medicare Other | Admitting: Internal Medicine

## 2011-11-28 VITALS — BP 108/70 | HR 69 | Ht 62.0 in | Wt 95.0 lb

## 2011-11-28 DIAGNOSIS — J329 Chronic sinusitis, unspecified: Secondary | ICD-10-CM | POA: Diagnosis not present

## 2011-11-28 DIAGNOSIS — Z23 Encounter for immunization: Secondary | ICD-10-CM

## 2011-11-28 DIAGNOSIS — J438 Other emphysema: Secondary | ICD-10-CM | POA: Diagnosis not present

## 2011-11-28 DIAGNOSIS — J449 Chronic obstructive pulmonary disease, unspecified: Secondary | ICD-10-CM

## 2011-11-28 DIAGNOSIS — J439 Emphysema, unspecified: Secondary | ICD-10-CM
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
## 2011-11-28 NOTE — Patient Instructions (Addendum)
Order- ENT referral [facility]   Chronic sinusitis             Alpha-1 antitrypsin screen     Dx emphysema

## 2011-11-28 NOTE — Assessment & Plan Note (Signed)
Chronic opacification seen repeatedly of head CT. Not purulent. Consider possible fungal sinusitis. Plan- She accepts ENT referral to consider management of sinus disease.

## 2011-12-26 ENCOUNTER — Ambulatory Visit: Payer: Medicare Other | Admitting: Internal Medicine

## 2012-01-01 ENCOUNTER — Other Ambulatory Visit: Payer: Self-pay | Admitting: Cardiology

## 2012-01-01 MED ORDER — DIGOXIN 125 MCG PO TABS: 125.0000 ug | ORAL_TABLET | Freq: Every day | ORAL | Status: DC

## 2012-01-10 ENCOUNTER — Telehealth: Payer: Self-pay | Admitting: Internal Medicine

## 2012-01-10 MED ORDER — ALBUTEROL SULFATE HFA 108 (90 BASE) MCG/ACT IN AERS: 2.0000 | INHALATION_SPRAY | Freq: Four times a day (QID) | RESPIRATORY_TRACT | Status: DC | PRN

## 2012-01-10 MED ORDER — TIOTROPIUM BROMIDE MONOHYDRATE 18 MCG IN CAPS: 18.0000 ug | ORAL_CAPSULE | RESPIRATORY_TRACT | Status: DC | PRN

## 2012-01-10 NOTE — Telephone Encounter (Signed)
Prescriptions have been sent to [facility].

## 2012-01-24 ENCOUNTER — Encounter: Payer: Self-pay | Admitting: Internal Medicine

## 2012-01-24 ENCOUNTER — Ambulatory Visit (INDEPENDENT_AMBULATORY_CARE_PROVIDER_SITE_OTHER): Payer: Medicare Other | Admitting: *Deleted

## 2012-01-24 DIAGNOSIS — R55 Syncope and collapse: Secondary | ICD-10-CM | POA: Diagnosis not present

## 2012-01-24 NOTE — Patient Instructions (Addendum)
Return office visit 04/22/12 @ 9:00am with the device clinic.

## 2012-01-24 NOTE — Progress Notes (Signed)
ILR check 

## 2012-02-28 DIAGNOSIS — E119 Type 2 diabetes mellitus without complications: Secondary | ICD-10-CM | POA: Diagnosis not present

## 2012-02-28 DIAGNOSIS — E785 Hyperlipidemia, unspecified: Secondary | ICD-10-CM | POA: Diagnosis not present

## 2012-02-28 DIAGNOSIS — E559 Vitamin D deficiency, unspecified: Secondary | ICD-10-CM | POA: Diagnosis not present

## 2012-03-04 DIAGNOSIS — E119 Type 2 diabetes mellitus without complications: Secondary | ICD-10-CM | POA: Diagnosis not present

## 2012-03-04 DIAGNOSIS — I10 Essential (primary) hypertension: Secondary | ICD-10-CM | POA: Diagnosis not present

## 2012-03-04 DIAGNOSIS — E559 Vitamin D deficiency, unspecified: Secondary | ICD-10-CM | POA: Diagnosis not present

## 2012-03-04 DIAGNOSIS — E785 Hyperlipidemia, unspecified: Secondary | ICD-10-CM | POA: Diagnosis not present

## 2012-04-08 ENCOUNTER — Ambulatory Visit: Payer: Medicare Other | Admitting: Internal Medicine

## 2012-04-09 ENCOUNTER — Ambulatory Visit (INDEPENDENT_AMBULATORY_CARE_PROVIDER_SITE_OTHER)
Admission: RE | Admit: 2012-04-09 | Discharge: 2012-04-09 | Disposition: A | Discharge status: Home / Self Care | Payer: Medicare Other | Source: Ambulatory Visit | Attending: Internal Medicine | Admitting: Internal Medicine

## 2012-04-09 ENCOUNTER — Encounter: Payer: Self-pay | Admitting: Internal Medicine

## 2012-04-09 ENCOUNTER — Ambulatory Visit (INDEPENDENT_AMBULATORY_CARE_PROVIDER_SITE_OTHER): Payer: Medicare Other | Admitting: Internal Medicine

## 2012-04-09 VITALS — BP 118/64 | HR 62 | Ht 62.0 in | Wt 97.4 lb

## 2012-04-09 DIAGNOSIS — J309 Allergic rhinitis, unspecified: Secondary | ICD-10-CM | POA: Diagnosis not present

## 2012-04-09 DIAGNOSIS — J441 Chronic obstructive pulmonary disease with (acute) exacerbation: Secondary | ICD-10-CM | POA: Diagnosis not present

## 2012-04-09 NOTE — Patient Instructions (Addendum)
Order- CXR    Dx COPD  Please call as needed for med refills
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
## 2012-04-11 NOTE — Assessment & Plan Note (Signed)
There is probably a nonallergic component/vasomotor.

## 2012-04-11 NOTE — Assessment & Plan Note (Addendum)
Acute bronchitic exacerbation of COPD consistent with viral infection. We discussed conservative management. Plan-chest x-ray

## 2012-04-15 NOTE — Progress Notes (Signed)
Quick Note:  Pt aware of results. ______ 

## 2012-04-18 ENCOUNTER — Other Ambulatory Visit: Payer: Self-pay | Admitting: Internal Medicine

## 2012-04-22 ENCOUNTER — Ambulatory Visit (INDEPENDENT_AMBULATORY_CARE_PROVIDER_SITE_OTHER): Payer: Medicare Other | Admitting: *Deleted

## 2012-04-22 ENCOUNTER — Encounter: Payer: Self-pay | Admitting: Internal Medicine

## 2012-04-22 ENCOUNTER — Other Ambulatory Visit: Payer: Self-pay | Admitting: Internal Medicine

## 2012-04-22 LAB — PACEMAKER DEVICE OBSERVATION

## 2012-04-22 NOTE — Progress Notes (Signed)
ILR interrogation 

## 2012-04-26 ENCOUNTER — Other Ambulatory Visit: Payer: Self-pay | Admitting: Internal Medicine

## 2012-04-26 MED ORDER — MONTELUKAST SODIUM 10 MG PO TABS: 10.0000 mg | ORAL_TABLET | Freq: Every day | ORAL | Status: DC

## 2012-04-27 ENCOUNTER — Other Ambulatory Visit: Payer: Self-pay | Admitting: Adult Health

## 2012-04-30 ENCOUNTER — Other Ambulatory Visit: Payer: Self-pay | Admitting: *Deleted

## 2012-04-30 MED ORDER — BUDESONIDE 0.25 MG/2ML IN SUSP: RESPIRATORY_TRACT | Status: DC

## 2012-05-05 ENCOUNTER — Other Ambulatory Visit: Payer: Self-pay | Admitting: Internal Medicine

## 2012-05-29 ENCOUNTER — Telehealth: Payer: Self-pay | Admitting: Internal Medicine

## 2012-05-29 NOTE — Telephone Encounter (Signed)
Pt advised. Jennifer Castillo, CMA  

## 2012-05-29 NOTE — Telephone Encounter (Signed)
She could try using otc nasal decongestant spray Afrin once or twice daily for not more than 3 days.

## 2012-05-29 NOTE — Telephone Encounter (Signed)
Spoke with patient-states she has been using OTC meds and Claritin today-no relief; pt c/o stopped up nose but runs, has been breathing through her mouth so much that her mouth is sore; cough that is dry x 3 weeks; denies any fever or chills. Please advise. Thanks.  Allergies  Allergen Reactions  . Epinephrine Other (See Comments)    Patient thinks her heart stopped and the MD said never to take this again.  Berle Mull Dye (Iodinated Diagnostic Agents) Other (See Comments)    Unknown-patient said she thinks her heart stopped, but she really can't remember  . Albuterol Sulfate Nausea And Vomiting    Pt reports she is able to tolerate the albuterol hfa, not the nebulizer solution.  Jonne Ply (Aspirin) Other (See Comments)    Heart doctor told her to take advil instead

## 2012-06-03 DIAGNOSIS — E559 Vitamin D deficiency, unspecified: Secondary | ICD-10-CM | POA: Diagnosis not present

## 2012-06-03 DIAGNOSIS — E119 Type 2 diabetes mellitus without complications: Secondary | ICD-10-CM | POA: Diagnosis not present

## 2012-06-03 DIAGNOSIS — E785 Hyperlipidemia, unspecified: Secondary | ICD-10-CM | POA: Diagnosis not present

## 2012-06-05 DIAGNOSIS — E785 Hyperlipidemia, unspecified: Secondary | ICD-10-CM | POA: Diagnosis not present

## 2012-06-05 DIAGNOSIS — E559 Vitamin D deficiency, unspecified: Secondary | ICD-10-CM | POA: Diagnosis not present

## 2012-06-05 DIAGNOSIS — E119 Type 2 diabetes mellitus without complications: Secondary | ICD-10-CM | POA: Diagnosis not present

## 2012-06-05 DIAGNOSIS — I10 Essential (primary) hypertension: Secondary | ICD-10-CM | POA: Diagnosis not present

## 2012-06-29 ENCOUNTER — Other Ambulatory Visit: Payer: Self-pay | Admitting: Internal Medicine

## 2012-07-11 ENCOUNTER — Telehealth: Payer: Self-pay | Admitting: Internal Medicine

## 2012-07-11 MED ORDER — FORMOTEROL FUMARATE 20 MCG/2ML IN NEBU: 20.0000 ug | INHALATION_SOLUTION | Freq: Two times a day (BID) | RESPIRATORY_TRACT | Status: DC | PRN

## 2012-07-11 NOTE — Telephone Encounter (Signed)
Pt requesting rx for permorist 20 mcg/ml use 2 times a day prn.  Allergies  Allergen Reactions  . Epinephrine Other (See Comments)    Patient thinks her heart stopped and the MD said never to take this again.  Berle Mull Dye (Iodinated Diagnostic Agents) Other (See Comments)    Unknown-patient said she thinks her heart stopped, but she really can't remember  . Albuterol Sulfate Nausea And Vomiting    Pt reports she is able to tolerate the albuterol hfa, not the nebulizer solution.  Jonne Ply (Aspirin) Other (See Comments)    Heart doctor told her to take advil instead   Pt allergy warning popped up when i tried to send this.  Dr young Please advise is this ok to send.? Thank you

## 2012-07-11 NOTE — Telephone Encounter (Signed)
Per CY  performist  #[id number]   1 bid prn neb  Refill prn.  thanks

## 2012-07-11 NOTE — Telephone Encounter (Signed)
Pt aware rx has been sent. Nothing further was needed 

## 2012-07-22 ENCOUNTER — Encounter: Payer: Self-pay | Admitting: Internal Medicine

## 2012-07-22 ENCOUNTER — Ambulatory Visit (INDEPENDENT_AMBULATORY_CARE_PROVIDER_SITE_OTHER): Payer: Medicare Other | Admitting: *Deleted

## 2012-07-22 DIAGNOSIS — R55 Syncope and collapse: Secondary | ICD-10-CM | POA: Diagnosis not present

## 2012-07-22 LAB — PACEMAKER DEVICE OBSERVATION: DEVICE MODEL PM: 2341959

## 2012-07-22 NOTE — Progress Notes (Signed)
ILR interrogation in office. 

## 2012-08-01 ENCOUNTER — Other Ambulatory Visit: Payer: Self-pay | Admitting: Internal Medicine

## 2012-08-05 ENCOUNTER — Encounter: Payer: Self-pay | Admitting: Internal Medicine

## 2012-08-05 ENCOUNTER — Ambulatory Visit (INDEPENDENT_AMBULATORY_CARE_PROVIDER_SITE_OTHER): Payer: Medicare Other | Admitting: Internal Medicine

## 2012-08-05 VITALS — BP 108/60 | HR 69 | Ht 62.0 in | Wt 96.2 lb

## 2012-08-05 DIAGNOSIS — J309 Allergic rhinitis, unspecified: Secondary | ICD-10-CM | POA: Diagnosis not present

## 2012-08-05 DIAGNOSIS — J449 Chronic obstructive pulmonary disease, unspecified: Secondary | ICD-10-CM | POA: Diagnosis not present

## 2012-08-05 DIAGNOSIS — J302 Other seasonal allergic rhinitis: Secondary | ICD-10-CM

## 2012-08-05 DIAGNOSIS — IMO0001 Reserved for inherently not codable concepts without codable children: Secondary | ICD-10-CM

## 2012-08-05 DIAGNOSIS — J3089 Other allergic rhinitis: Secondary | ICD-10-CM

## 2012-08-05 MED ORDER — AZELASTINE-FLUTICASONE 137-50 MCG/ACT NA SUSP: 2.0000 | Freq: Every day | NASAL | Status: DC

## 2012-08-05 NOTE — Patient Instructions (Signed)
Sample Dymista nasal spray  Use 1-2 puffs, each nostril, once every day at  Bedtime. Be sure to use this every day to give it a chance to help your stuffy nose.  Ask to Dr Jillyn Hidden to get you some help for your sore left hip
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
## 2012-08-07 ENCOUNTER — Encounter: Payer: Self-pay | Admitting: Internal Medicine

## 2012-08-18 NOTE — Assessment & Plan Note (Signed)
Mucosa looks allergic and she always sounds stuffy with no visible polyps. Plan-sample Dymista. ENT referral for turbinate reduction could be considered but might be more surgery than she can manage

## 2012-08-18 NOTE — Assessment & Plan Note (Signed)
Mild recent acute bronchitis exacerbation now fading. No change in treatment necessary.

## 2012-08-30 ENCOUNTER — Other Ambulatory Visit: Payer: Self-pay | Admitting: Internal Medicine

## 2012-09-02 ENCOUNTER — Other Ambulatory Visit: Payer: Self-pay

## 2012-09-02 MED ORDER — METOPROLOL TARTRATE 25 MG PO TABS: ORAL_TABLET | ORAL | Status: DC

## 2012-09-10 ENCOUNTER — Encounter: Payer: Self-pay | Admitting: Internal Medicine

## 2012-10-16 ENCOUNTER — Encounter: Payer: Self-pay | Admitting: Internal Medicine

## 2012-10-24 DIAGNOSIS — E119 Type 2 diabetes mellitus without complications: Secondary | ICD-10-CM | POA: Diagnosis not present

## 2012-10-24 DIAGNOSIS — E785 Hyperlipidemia, unspecified: Secondary | ICD-10-CM | POA: Diagnosis not present

## 2012-10-25 ENCOUNTER — Encounter: Payer: Medicare Other | Admitting: Cardiology

## 2012-10-25 ENCOUNTER — Encounter: Payer: Medicare Other | Admitting: Internal Medicine

## 2012-11-06 DIAGNOSIS — M25559 Pain in unspecified hip: Secondary | ICD-10-CM | POA: Diagnosis not present

## 2012-11-20 DIAGNOSIS — M25559 Pain in unspecified hip: Secondary | ICD-10-CM | POA: Diagnosis not present

## 2012-11-21 DIAGNOSIS — Z23 Encounter for immunization: Secondary | ICD-10-CM | POA: Diagnosis not present

## 2012-11-21 DIAGNOSIS — E559 Vitamin D deficiency, unspecified: Secondary | ICD-10-CM | POA: Diagnosis not present

## 2012-11-21 DIAGNOSIS — E785 Hyperlipidemia, unspecified: Secondary | ICD-10-CM | POA: Diagnosis not present

## 2012-11-21 DIAGNOSIS — I10 Essential (primary) hypertension: Secondary | ICD-10-CM | POA: Diagnosis not present

## 2012-11-21 DIAGNOSIS — E119 Type 2 diabetes mellitus without complications: Secondary | ICD-10-CM | POA: Diagnosis not present

## 2012-11-22 DIAGNOSIS — M25559 Pain in unspecified hip: Secondary | ICD-10-CM | POA: Diagnosis not present

## 2012-11-29 ENCOUNTER — Other Ambulatory Visit: Payer: Self-pay | Admitting: Internal Medicine

## 2012-12-30 ENCOUNTER — Inpatient Hospital Stay (HOSPITAL_COMMUNITY)
Admission: EM | Admit: 2012-12-30 | Discharge: 2013-01-01 | DRG: 191 | Disposition: A | Discharge status: Home / Self Care | Payer: Medicare Other | Attending: Internal Medicine | Admitting: Internal Medicine

## 2012-12-30 ENCOUNTER — Inpatient Hospital Stay (HOSPITAL_COMMUNITY): Payer: Medicare Other

## 2012-12-30 ENCOUNTER — Encounter (HOSPITAL_COMMUNITY): Payer: Self-pay | Admitting: Emergency Medicine

## 2012-12-30 ENCOUNTER — Emergency Department (HOSPITAL_COMMUNITY): Payer: Medicare Other

## 2012-12-30 ENCOUNTER — Observation Stay (HOSPITAL_COMMUNITY): Payer: Medicare Other

## 2012-12-30 DIAGNOSIS — J4489 Other specified chronic obstructive pulmonary disease: Secondary | ICD-10-CM | POA: Diagnosis not present

## 2012-12-30 DIAGNOSIS — Z86718 Personal history of other venous thrombosis and embolism: Secondary | ICD-10-CM | POA: Diagnosis not present

## 2012-12-30 DIAGNOSIS — Z85820 Personal history of malignant melanoma of skin: Secondary | ICD-10-CM

## 2012-12-30 DIAGNOSIS — I519 Heart disease, unspecified: Secondary | ICD-10-CM

## 2012-12-30 DIAGNOSIS — Z91041 Radiographic dye allergy status: Secondary | ICD-10-CM

## 2012-12-30 DIAGNOSIS — J329 Chronic sinusitis, unspecified: Secondary | ICD-10-CM

## 2012-12-30 DIAGNOSIS — Z8249 Family history of ischemic heart disease and other diseases of the circulatory system: Secondary | ICD-10-CM | POA: Diagnosis not present

## 2012-12-30 DIAGNOSIS — R0602 Shortness of breath: Secondary | ICD-10-CM | POA: Diagnosis not present

## 2012-12-30 DIAGNOSIS — E871 Hypo-osmolality and hyponatremia: Secondary | ICD-10-CM

## 2012-12-30 DIAGNOSIS — F411 Generalized anxiety disorder: Secondary | ICD-10-CM | POA: Diagnosis present

## 2012-12-30 DIAGNOSIS — R112 Nausea with vomiting, unspecified: Secondary | ICD-10-CM

## 2012-12-30 DIAGNOSIS — I4891 Unspecified atrial fibrillation: Secondary | ICD-10-CM | POA: Diagnosis not present

## 2012-12-30 DIAGNOSIS — F419 Anxiety disorder, unspecified: Secondary | ICD-10-CM

## 2012-12-30 DIAGNOSIS — E119 Type 2 diabetes mellitus without complications: Secondary | ICD-10-CM | POA: Diagnosis not present

## 2012-12-30 DIAGNOSIS — J302 Other seasonal allergic rhinitis: Secondary | ICD-10-CM

## 2012-12-30 DIAGNOSIS — R110 Nausea: Secondary | ICD-10-CM | POA: Diagnosis not present

## 2012-12-30 DIAGNOSIS — M129 Arthropathy, unspecified: Secondary | ICD-10-CM | POA: Diagnosis present

## 2012-12-30 DIAGNOSIS — Z833 Family history of diabetes mellitus: Secondary | ICD-10-CM

## 2012-12-30 DIAGNOSIS — I471 Supraventricular tachycardia: Secondary | ICD-10-CM

## 2012-12-30 DIAGNOSIS — N179 Acute kidney failure, unspecified: Secondary | ICD-10-CM

## 2012-12-30 DIAGNOSIS — F29 Unspecified psychosis not due to a substance or known physiological condition: Secondary | ICD-10-CM | POA: Diagnosis not present

## 2012-12-30 DIAGNOSIS — I10 Essential (primary) hypertension: Secondary | ICD-10-CM | POA: Diagnosis present

## 2012-12-30 DIAGNOSIS — I824Y9 Acute embolism and thrombosis of unspecified deep veins of unspecified proximal lower extremity: Secondary | ICD-10-CM | POA: Diagnosis not present

## 2012-12-30 DIAGNOSIS — J45901 Unspecified asthma with (acute) exacerbation: Secondary | ICD-10-CM | POA: Diagnosis not present

## 2012-12-30 DIAGNOSIS — R079 Chest pain, unspecified: Secondary | ICD-10-CM | POA: Diagnosis present

## 2012-12-30 DIAGNOSIS — D649 Anemia, unspecified: Secondary | ICD-10-CM | POA: Diagnosis not present

## 2012-12-30 DIAGNOSIS — R410 Disorientation, unspecified: Secondary | ICD-10-CM

## 2012-12-30 DIAGNOSIS — J441 Chronic obstructive pulmonary disease with (acute) exacerbation: Principal | ICD-10-CM | POA: Diagnosis present

## 2012-12-30 DIAGNOSIS — Z79899 Other long term (current) drug therapy: Secondary | ICD-10-CM | POA: Diagnosis not present

## 2012-12-30 DIAGNOSIS — R911 Solitary pulmonary nodule: Secondary | ICD-10-CM

## 2012-12-30 DIAGNOSIS — R109 Unspecified abdominal pain: Secondary | ICD-10-CM | POA: Diagnosis not present

## 2012-12-30 DIAGNOSIS — R55 Syncope and collapse: Secondary | ICD-10-CM

## 2012-12-30 DIAGNOSIS — J449 Chronic obstructive pulmonary disease, unspecified: Secondary | ICD-10-CM | POA: Diagnosis not present

## 2012-12-30 DIAGNOSIS — I82402 Acute embolism and thrombosis of unspecified deep veins of left lower extremity: Secondary | ICD-10-CM

## 2012-12-30 DIAGNOSIS — I82409 Acute embolism and thrombosis of unspecified deep veins of unspecified lower extremity: Secondary | ICD-10-CM | POA: Diagnosis not present

## 2012-12-30 DIAGNOSIS — M792 Neuralgia and neuritis, unspecified: Secondary | ICD-10-CM

## 2012-12-30 DIAGNOSIS — IMO0001 Reserved for inherently not codable concepts without codable children: Secondary | ICD-10-CM

## 2012-12-30 LAB — CBC
HCT: 37.1 % (ref 36.0–46.0)
HCT: 36.1 % (ref 36.0–46.0)
Hemoglobin: 12.5 g/dL (ref 12.0–15.0)
MCH: 31.8 pg (ref 26.0–34.0)
MCH: 32.5 pg (ref 26.0–34.0)
MCV: 94.4 fL (ref 78.0–100.0)
MCV: 95.3 fL (ref 78.0–100.0)
Platelets: 167 10*3/uL (ref 150–400)
Platelets: 171 10*3/uL (ref 150–400)
RBC: 3.79 MIL/uL — ABNORMAL LOW (ref 3.87–5.11)
RDW: 13.7 % (ref 11.5–15.5)
WBC: 3 10*3/uL — ABNORMAL LOW (ref 4.0–10.5)
WBC: 5.1 10*3/uL (ref 4.0–10.5)

## 2012-12-30 LAB — URINE MICROSCOPIC-ADD ON

## 2012-12-30 LAB — BASIC METABOLIC PANEL
CO2: 25 mEq/L (ref 19–32)
Calcium: 8.8 mg/dL (ref 8.4–10.5)
Chloride: 98 mEq/L (ref 96–112)
Creatinine, Ser: 0.58 mg/dL (ref 0.50–1.10)
GFR calc Af Amer: >90 mL/min (ref >90)
Glucose, Bld: 114 mg/dL — ABNORMAL HIGH (ref 70–99)
Sodium: 133 mEq/L — ABNORMAL LOW (ref 135–145)

## 2012-12-30 LAB — URINALYSIS, ROUTINE W REFLEX MICROSCOPIC
Bilirubin Urine: NEGATIVE
Glucose, UA: >1000 mg/dL — AB
Ketones, ur: 15 mg/dL — AB
Leukocytes, UA: NEGATIVE
Nitrite: NEGATIVE
Protein, ur: NEGATIVE mg/dL
Urobilinogen, UA: 0.2 mg/dL (ref 0.0–1.0)
pH: 5.5 (ref 5.0–8.0)

## 2012-12-30 LAB — TROPONIN I: Troponin I: <0.3 ng/mL (ref <0.30)

## 2012-12-30 LAB — POCT I-STAT 3, VENOUS BLOOD GAS (G3P V)
O2 Saturation: 83 %
TCO2: 27 mmol/L (ref 0–100)
pCO2, Ven: 40.9 mmHg — ABNORMAL LOW (ref 45.0–50.0)
pH, Ven: 7.406 — ABNORMAL HIGH (ref 7.250–7.300)
pO2, Ven: 47 mmHg — ABNORMAL HIGH (ref 30.0–45.0)

## 2012-12-30 LAB — PRO B NATRIURETIC PEPTIDE: Pro B Natriuretic peptide (BNP): 388.5 pg/mL (ref 0–450)

## 2012-12-30 LAB — CG4 I-STAT (LACTIC ACID): Lactic Acid, Venous: 0.9 mmol/L (ref 0.5–2.2)

## 2012-12-30 LAB — CREATININE, SERUM
GFR calc Af Amer: >90 mL/min (ref >90)
GFR calc non Af Amer: 84 mL/min — ABNORMAL LOW (ref >90)

## 2012-12-30 LAB — DIGOXIN LEVEL: Digoxin Level: 0.7 ng/mL — ABNORMAL LOW (ref 0.8–2.0)

## 2012-12-30 MED ORDER — TECHNETIUM TO 99M ALBUMIN AGGREGATED
6.0000 | Freq: Once | INTRAVENOUS | Status: AC | PRN
  Administered 2012-12-30: 6 via INTRAVENOUS

## 2012-12-30 MED ORDER — IPRATROPIUM BROMIDE 0.02 % IN SOLN
0.5000 mg | Freq: Two times a day (BID) | RESPIRATORY_TRACT | Status: DC
  Administered 2012-12-31: 0.5 mg via RESPIRATORY_TRACT
  Filled 2012-12-30: qty 2.5

## 2012-12-30 MED ORDER — DEXTROSE 5 % IV SOLN
500.0000 mg | Freq: Once | INTRAVENOUS | Status: AC
  Administered 2012-12-30: 500 mg via INTRAVENOUS

## 2012-12-30 MED ORDER — ONDANSETRON HCL 4 MG/2ML IJ SOLN
4.0000 mg | Freq: Four times a day (QID) | INTRAMUSCULAR | Status: DC | PRN
  Administered 2012-12-30: 4 mg via INTRAVENOUS
  Filled 2012-12-30: qty 2

## 2012-12-30 MED ORDER — ARFORMOTEROL TARTRATE 15 MCG/2ML IN NEBU
15.0000 ug | INHALATION_SOLUTION | Freq: Two times a day (BID) | RESPIRATORY_TRACT | Status: DC
  Administered 2012-12-31 (×2): 15 ug via RESPIRATORY_TRACT
  Filled 2012-12-30 (×7): qty 2

## 2012-12-30 MED ORDER — INSULIN ASPART 100 UNIT/ML ~~LOC~~ SOLN
0.0000 [IU] | Freq: Three times a day (TID) | SUBCUTANEOUS | Status: DC
  Administered 2012-12-31: 1 [IU] via SUBCUTANEOUS
  Administered 2012-12-31: 5 [IU] via SUBCUTANEOUS
  Administered 2012-12-31: 1 [IU] via SUBCUTANEOUS

## 2012-12-30 MED ORDER — MONTELUKAST SODIUM 10 MG PO TABS
10.0000 mg | ORAL_TABLET | Freq: Every day | ORAL | Status: DC
  Administered 2012-12-31 – 2013-01-01 (×2): 10 mg via ORAL
  Filled 2012-12-30 (×2): qty 1

## 2012-12-30 MED ORDER — LEVOFLOXACIN IN D5W 750 MG/150ML IV SOLN
750.0000 mg | INTRAVENOUS | Status: DC
  Administered 2012-12-30: 750 mg via INTRAVENOUS
  Filled 2012-12-30 (×2): qty 150

## 2012-12-30 MED ORDER — ACETAMINOPHEN 650 MG RE SUPP: 650.0000 mg | Freq: Four times a day (QID) | RECTAL | Status: DC | PRN

## 2012-12-30 MED ORDER — DIGOXIN 125 MCG PO TABS
0.1250 mg | ORAL_TABLET | Freq: Every day | ORAL | Status: DC
  Administered 2012-12-31 – 2013-01-01 (×2): 0.125 mg via ORAL
  Filled 2012-12-30 (×2): qty 1

## 2012-12-30 MED ORDER — PREDNISONE 50 MG PO TABS
60.0000 mg | ORAL_TABLET | Freq: Every day | ORAL | Status: DC
  Administered 2012-12-31 – 2013-01-01 (×2): 60 mg via ORAL
  Filled 2012-12-30 (×3): qty 1

## 2012-12-30 MED ORDER — SODIUM CHLORIDE 0.9 % IJ SOLN
3.0000 mL | Freq: Two times a day (BID) | INTRAMUSCULAR | Status: DC
  Administered 2012-12-31: 3 mL via INTRAVENOUS

## 2012-12-30 MED ORDER — ACETAMINOPHEN 325 MG PO TABS
650.0000 mg | ORAL_TABLET | Freq: Four times a day (QID) | ORAL | Status: DC | PRN
  Administered 2012-12-31: 650 mg via ORAL
  Filled 2012-12-30: qty 2

## 2012-12-30 MED ORDER — LEVALBUTEROL TARTRATE 45 MCG/ACT IN AERO
6.0000 | INHALATION_SPRAY | Freq: Once | RESPIRATORY_TRACT | Status: AC
  Administered 2012-12-30: 6 via RESPIRATORY_TRACT
  Filled 2012-12-30: qty 15

## 2012-12-30 MED ORDER — DOCUSATE SODIUM 100 MG PO CAPS
100.0000 mg | ORAL_CAPSULE | Freq: Two times a day (BID) | ORAL | Status: DC
  Administered 2012-12-30 – 2013-01-01 (×4): 100 mg via ORAL
  Filled 2012-12-30 (×5): qty 1

## 2012-12-30 MED ORDER — SODIUM CHLORIDE 0.9 % IV SOLN
INTRAVENOUS | Status: AC
  Administered 2012-12-30: 23:00:00 via INTRAVENOUS

## 2012-12-30 MED ORDER — INSULIN ASPART 100 UNIT/ML ~~LOC~~ SOLN
0.0000 [IU] | Freq: Every day | SUBCUTANEOUS | Status: DC
  Administered 2012-12-30: 2 [IU] via SUBCUTANEOUS

## 2012-12-30 MED ORDER — METOPROLOL SUCCINATE 12.5 MG HALF TABLET
12.5000 mg | ORAL_TABLET | Freq: Two times a day (BID) | ORAL | Status: DC
  Administered 2012-12-31 – 2013-01-01 (×3): 12.5 mg via ORAL
  Filled 2012-12-30 (×6): qty 1

## 2012-12-30 MED ORDER — ONDANSETRON HCL 4 MG/2ML IJ SOLN: 4.0000 mg | Freq: Three times a day (TID) | INTRAMUSCULAR | Status: DC | PRN

## 2012-12-30 MED ORDER — METHYLPREDNISOLONE SODIUM SUCC 125 MG IJ SOLR
125.0000 mg | Freq: Once | INTRAMUSCULAR | Status: AC
  Administered 2012-12-30: 125 mg via INTRAVENOUS
  Filled 2012-12-30: qty 2

## 2012-12-30 MED ORDER — BUDESONIDE 0.25 MG/2ML IN SUSP
0.2500 mg | Freq: Two times a day (BID) | RESPIRATORY_TRACT | Status: DC
  Administered 2012-12-30 – 2013-01-01 (×4): 0.25 mg via RESPIRATORY_TRACT
  Filled 2012-12-30 (×6): qty 2

## 2012-12-30 MED ORDER — SIMVASTATIN 20 MG PO TABS
20.0000 mg | ORAL_TABLET | Freq: Every day | ORAL | Status: DC
  Administered 2012-12-31 – 2013-01-01 (×2): 20 mg via ORAL
  Filled 2012-12-30 (×2): qty 1

## 2012-12-30 MED ORDER — AZELASTINE-FLUTICASONE 137-50 MCG/ACT NA SUSP: 2.0000 | Freq: Every day | NASAL | Status: DC

## 2012-12-30 MED ORDER — ALBUTEROL SULFATE HFA 108 (90 BASE) MCG/ACT IN AERS: 2.0000 | INHALATION_SPRAY | RESPIRATORY_TRACT | Status: DC | PRN

## 2012-12-30 MED ORDER — ALBUTEROL SULFATE HFA 108 (90 BASE) MCG/ACT IN AERS
6.0000 | INHALATION_SPRAY | Freq: Once | RESPIRATORY_TRACT | Status: DC
  Filled 2012-12-30: qty 6.7

## 2012-12-30 MED ORDER — HYDROCODONE-ACETAMINOPHEN 5-325 MG PO TABS: 1.0000 | ORAL_TABLET | ORAL | Status: DC | PRN

## 2012-12-30 MED ORDER — IPRATROPIUM BROMIDE 0.02 % IN SOLN
0.5000 mg | Freq: Four times a day (QID) | RESPIRATORY_TRACT | Status: DC
  Administered 2012-12-30: 0.5 mg via RESPIRATORY_TRACT
  Filled 2012-12-30: qty 2.5

## 2012-12-30 MED ORDER — ENOXAPARIN SODIUM 40 MG/0.4ML ~~LOC~~ SOLN
40.0000 mg | SUBCUTANEOUS | Status: DC
  Administered 2012-12-30: 40 mg via SUBCUTANEOUS
  Filled 2012-12-30 (×2): qty 0.4

## 2012-12-30 MED ORDER — TECHNETIUM TC 99M DIETHYLENETRIAME-PENTAACETIC ACID: 40.0000 | Freq: Once | INTRAVENOUS | Status: AC | PRN

## 2012-12-30 MED ORDER — MORPHINE SULFATE 2 MG/ML IJ SOLN: 2.0000 mg | INTRAMUSCULAR | Status: DC | PRN
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
## 2012-12-30 NOTE — ED Notes (Signed)
MD at bedside. 

## 2012-12-30 NOTE — ED Notes (Signed)
Food tray ordered for patient.

## 2012-12-30 NOTE — ED Notes (Signed)
Called report to [place], Charity fundraiser. [address].
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
## 2012-12-30 NOTE — ED Notes (Signed)
Attempted to call report to unit. Informed that the patient has not been assigned to a nurse yet and they are in report. Will call me back.

## 2012-12-30 NOTE — ED Notes (Signed)
Removed patient from oxygen, able to maintain her sat levels without any difficulty.

## 2012-12-30 NOTE — ED Notes (Signed)
Pt here with copd, sounds congested, and short of breath.  States she cannot breath.  Weak

## 2012-12-30 NOTE — ED Notes (Signed)
Called floor to inform them of patients delay in arriving to unit due to V/Q scan.

## 2012-12-31 DIAGNOSIS — I10 Essential (primary) hypertension: Secondary | ICD-10-CM

## 2012-12-31 DIAGNOSIS — I82409 Acute embolism and thrombosis of unspecified deep veins of unspecified lower extremity: Secondary | ICD-10-CM

## 2012-12-31 DIAGNOSIS — R0602 Shortness of breath: Secondary | ICD-10-CM

## 2012-12-31 LAB — COMPREHENSIVE METABOLIC PANEL
Albumin: 3.4 g/dL — ABNORMAL LOW (ref 3.5–5.2)
Alkaline Phosphatase: 55 U/L (ref 39–117)
BUN: 19 mg/dL (ref 6–23)
CO2: 25 mEq/L (ref 19–32)
Calcium: 8.8 mg/dL (ref 8.4–10.5)
Creatinine, Ser: 0.65 mg/dL (ref 0.50–1.10)
GFR calc Af Amer: >90 mL/min (ref >90)
GFR calc non Af Amer: 82 mL/min — ABNORMAL LOW (ref >90)
Glucose, Bld: 144 mg/dL — ABNORMAL HIGH (ref 70–99)
Potassium: 4 mEq/L (ref 3.5–5.1)
Total Bilirubin: 0.4 mg/dL (ref 0.3–1.2)

## 2012-12-31 LAB — CBC
Hemoglobin: 11.4 g/dL — ABNORMAL LOW (ref 12.0–15.0)
MCHC: 34.2 g/dL (ref 30.0–36.0)
Platelets: 177 10*3/uL (ref 150–400)
RBC: 3.59 MIL/uL — ABNORMAL LOW (ref 3.87–5.11)
RDW: 13.4 % (ref 11.5–15.5)
WBC: 3.2 10*3/uL — ABNORMAL LOW (ref 4.0–10.5)

## 2012-12-31 LAB — TROPONIN I: Troponin I: <0.3 ng/mL (ref <0.30)

## 2012-12-31 LAB — GLUCOSE, CAPILLARY
Glucose-Capillary: 123 mg/dL — ABNORMAL HIGH (ref 70–99)
Glucose-Capillary: 131 mg/dL — ABNORMAL HIGH (ref 70–99)

## 2012-12-31 LAB — TSH: TSH: 0.586 u[IU]/mL (ref 0.350–4.500)

## 2012-12-31 LAB — MRSA PCR SCREENING: MRSA by PCR: POSITIVE — AB

## 2012-12-31 LAB — PHOSPHORUS: Phosphorus: 3.5 mg/dL (ref 2.3–4.6)

## 2012-12-31 MED ORDER — POLYETHYLENE GLYCOL 3350 17 G PO PACK
17.0000 g | PACK | Freq: Every day | ORAL | Status: DC
  Administered 2012-12-31 – 2013-01-01 (×2): 17 g via ORAL
  Filled 2012-12-31 (×2): qty 1

## 2012-12-31 MED ORDER — TIOTROPIUM BROMIDE MONOHYDRATE 18 MCG IN CAPS
18.0000 ug | ORAL_CAPSULE | Freq: Every day | RESPIRATORY_TRACT | Status: DC
  Administered 2013-01-01: 18 ug via RESPIRATORY_TRACT
  Filled 2012-12-31: qty 5

## 2012-12-31 MED ORDER — LEVOFLOXACIN IN D5W 750 MG/150ML IV SOLN
750.0000 mg | INTRAVENOUS | Status: DC
  Filled 2012-12-31: qty 150

## 2012-12-31 MED ORDER — ENOXAPARIN SODIUM 40 MG/0.4ML ~~LOC~~ SOLN
40.0000 mg | Freq: Two times a day (BID) | SUBCUTANEOUS | Status: DC
  Administered 2012-12-31 – 2013-01-01 (×2): 40 mg via SUBCUTANEOUS
  Filled 2012-12-31 (×4): qty 0.4

## 2012-12-31 MED ORDER — SENNA 8.6 MG PO TABS
2.0000 | ORAL_TABLET | Freq: Every day | ORAL | Status: DC
  Administered 2012-12-31 – 2013-01-01 (×2): 17.2 mg via ORAL
  Filled 2012-12-31 (×2): qty 2

## 2012-12-31 MED ORDER — CHLORHEXIDINE GLUCONATE CLOTH 2 % EX PADS
6.0000 | MEDICATED_PAD | Freq: Every day | CUTANEOUS | Status: DC
  Administered 2012-12-31 – 2013-01-01 (×2): 6 via TOPICAL

## 2012-12-31 MED ORDER — MUPIROCIN 2 % EX OINT
1.0000 "application " | TOPICAL_OINTMENT | Freq: Two times a day (BID) | CUTANEOUS | Status: DC
  Administered 2012-12-31 – 2013-01-01 (×4): 1 via NASAL
  Filled 2012-12-31: qty 22

## 2012-12-31 MED ORDER — GLUCERNA SHAKE PO LIQD
237.0000 mL | Freq: Two times a day (BID) | ORAL | Status: DC
  Administered 2012-12-31 – 2013-01-01 (×2): 237 mL via ORAL

## 2012-12-31 NOTE — ED Provider Notes (Signed)
Medical screening examination/treatment/procedure(s) were conducted as a shared visit with non-physician practitioner(s) or resident and myself. I personally evaluated the patient during the encounter and agree with the findings and plan unless otherwise indicated.  I have personally reviewed any xrays and/ or EKG's with the provider and I agree with interpretation.  Gradually worsening sob for a few days, similar to previous copd. Recent hip fx/ abdo surgery per pt. No blood clot hx. Nothing improves sxs. No current abx or steroids. Exam exp wheeze bilateral, tachypnea, no distress, abd soft/ nt.  Clinically copd exacerbation - treatments in ED, pt stable on recheck. IV dye allergy.  With recent surgery d dimer, admission for further eval and VQ scan. EKG reviewed, no acute findings.  Acute copd exacerbation, Dyspnea   Enid Skeens, MD 12/31/12 [phone]

## 2012-12-31 NOTE — Care Management (Signed)
1649 12-31-12 CM did call [facility] and pt will need prior authorization for xarelto and eliquis. MD please call [phone]. Then co pay will be given after prior authorization. Gala Lewandowsky, RN,BSN [phone]

## 2012-12-31 NOTE — Progress Notes (Addendum)
[facility] PROGRESS NOTE    [name] YNW:[id number] DOB: 11-Nov-1931 DOA: 12/30/2012 PCP: Cain Saupe, MD Primary Cardiologist: Dr. Sharrell Ku  Primary Pulmonologist: Dr. Jetty Duhamel Primary Endocrinologist: Dr. Casimiro Needle Al[place]imer   HPI/Brief narrative 79 year old female patient with history of COPD, DM, loop recorder implantation, LV dysfunction with EF 45-50%, hypertension, anxiety, A. fib/SVT, chronic chest pain, apparently has been brought by her adult son leading to stress, lives alone with her 4 dogs, presented with worsening dyspnea associated with lower substernal chest discomfort. According to nursing, she apparently feels scared at night and has associated dyspnea frequently. She also complained of wheezing and increased cough. Fevers-did not check. No chills. home medications did not help and she presented to the ED. Her chest pain resolved once her breathing improved.  Assessment/Plan:  COPD exacerbation - Chest x-ray without acute findings. VQ scan: Low probability for PE - Patient admitted and started on empiric IV Levaquin, oral prednisone, breathing treatments and has clinically improved. - Anxiety may be contributing to her episodes.  Chest pain - Likely related to problem #1 - EKG in 11/17 without acute changes. EKG 11/18 with baseline artifacts and difficult to interpret. - Troponins negative. - Chest pain resolved  Incidental partial DVT involving left common femoral and popliteal veins - Patient denies prior history of DVT although DVT is listed in her history. She can't recollect being on blood thinners. - Consider anticoagulation. - Gives history of chronic LLE swelling - one year duration - Discussed with patient's PCP: Remote history of DVT. No known bleeding risks. Will start full dose Lovenox pending case manager evaluation to see whether she qualifies for Xarelto/Eliquis.  Type II DM - Continue SSI  Hypertension - Controlled  Mottled  collection of air in right lower pelvis-an x-ray - No abdominal pain and benign-appearing abdomen on exam - Discussed with surgeon who had operated on her 9/13, reviewed her x-rays and suggested that this suggestive of stools. Start bowel regimen and monitor   DVT prophylaxis: Lovenox  Code Status: Full Family Communication: None Disposition Plan: Home when stable- possibly 11/19   Consultants:  None  Procedures:  None  Antibiotics:  IV Levaquin   Subjective: Denies dyspnea or chest pain and wants to know if she can go home.   Objective: Filed Vitals:   12/30/12 2227 12/31/12 0605 12/31/12 0916 12/31/12 1343  BP: 114/73 115/61    Pulse: 79 70  73  Temp: 98 F (36.7 C) 97.1 F (36.2 C)  98.4 F (36.9 C)  TempSrc:  Oral    Resp: 20 18  20   Height: 5\' 3"  (1.6 m)     Weight: 42.638 kg (94 lb)     SpO2: 97% 97% 98%     Intake/Output Summary (Last 24 hours) at 12/31/12 1504 Last data filed at 12/31/12 0900  Gross per 24 hour  Intake    360 ml  Output      0 ml  Net    360 ml   Filed Weights   12/30/12 2227  Weight: 42.638 kg (94 lb)     Exam:  General exam: Elderly female sitting at edge of bed in no obvious distress  Respiratory system: Clear. No increased work of breathing. Cardiovascular system: S1 & S2 heard, RRR. No JVD, murmurs, gallops, clicks or pedal edema.Telemetry: Sinus rhythm  Gastrointestinal system: Abdomen is nondistended, soft and nontender. Normal bowel sounds heard. Central nervous system: Alert and oriented. No focal neurological deficits. Extremities: Symmetric 5 x 5  power.   Data Reviewed: Basic Metabolic Panel:  Recent Labs Lab 12/30/12 1450 12/30/12 2245 12/31/12 0540  NA 133*  --  131*  K 4.2  --  4.0  CL 98  --  97  CO2 25  --  25  GLUCOSE 114*  --  144*  BUN 16  --  19  CREATININE 0.58 0.62 0.65  CALCIUM 8.8  --  8.8  MG  --   --  2.1  PHOS  --   --  3.5   Liver Function Tests:  Recent Labs Lab 12/31/12 0540   AST 20  ALT 19  ALKPHOS 55  BILITOT 0.4  PROT 6.0  ALBUMIN 3.4*   No results found for this basename: LIPASE, AMYLASE,  in the last 168 hours No results found for this basename: AMMONIA,  in the last 168 hours CBC:  Recent Labs Lab 12/30/12 1450 12/30/12 2245 12/31/12 0540  WBC 3.0* 5.1 3.2*  HGB 12.5 12.3 11.4*  HCT 37.1 36.1 33.3*  MCV 94.4 95.3 92.8  PLT 167 171 177   Cardiac Enzymes:  Recent Labs Lab 12/30/12 2245 12/31/12 0540 12/31/12 1010  TROPONINI <0.30 <0.30 <0.30   BNP (last 3 results)  Recent Labs  12/30/12 1725  PROBNP 388.5   CBG:  Recent Labs Lab 12/30/12 2201 12/31/12 0744 12/31/12 1213  GLUCAP 208* 123* 131*    Recent Results (from the past 240 hour(s))  MRSA PCR SCREENING     Status: Abnormal   Collection Time    12/30/12 10:00 PM      Result Value Range Status   MRSA by PCR POSITIVE (*) NEGATIVE Final   Comment:            The GeneXpert MRSA Assay (FDA     approved for NASAL specimens     only), is one component of a     comprehensive MRSA colonization     surveillance program. It is not     intended to diagnose MRSA     infection nor to guide or     monitor treatment for     MRSA infections.     RESULT CALLED TO, READ BACK BY AND VERIFIED WITH:     HONEYCUTT,J RN 0105 12/31/12 MITCHELL,L      Additional labs: 1. None     Studies: Nm Pulmonary Perf And Vent  12/30/2012   CLINICAL DATA:  Shortness of breath  EXAM: NUCLEAR MEDICINE VENTILATION - PERFUSION LUNG SCAN  TECHNIQUE: Ventilation images were obtained in multiple projections using inhaled aerosol technetium 99 M DTPA. Perfusion images were obtained in multiple projections after intravenous injection of Tc-50m MAA.  COMPARISON:  V/Q scan 01/02/2011  RADIOPHARMACEUTICALS:  40 mCi Tc-55m DTPA aerosol and 6 mCi Tc-40m MAA  FINDINGS: There is a moderate sized perfusion defect in the posterior segment left upper lobe. Mildly heterogeneous perfusion diffusely with  small, not definitely segmental, perfusion defects. There is extensive air trapping with heterogeneous aeration, with hypoaeration including the above described areas.  IMPRESSION: 1. Low probability for pulmonary embolism per PIOPED II criteria. 2. COPD.   Electronically Signed   By: Tiburcio Pea M.D.   On: 12/30/2012 21:46   Dg Chest Port 1 View  12/30/2012   CLINICAL DATA:  Chest pain.  EXAM: PORTABLE CHEST - 1 VIEW  COMPARISON:  04/09/2012.  FINDINGS: Mediastinum and hilar structures are normal. Electronic recorder again noted over left chest in unchanged position. Lungs are clear of infiltrates. Changes  of COPD noted. Heart size state stable. No pneumothorax. No acute bony abnormality.  IMPRESSION: Stable chest with changes of COPD.   Electronically Signed   By: Maisie Fus  Register   On: 12/30/2012 15:49   Dg Abd Portable 1v  12/31/2012   CLINICAL DATA:  Nausea and abdominal pain.  EXAM: PORTABLE ABDOMEN - 1 VIEW  COMPARISON:  09/06/2011.  FINDINGS: Soft tissue structures are unremarkable. Stool is present throughout the colon. There is no bowel distention or free air. Noted in the right lower pelvis/ inguinal region is a mottled gas collection. Although this could represent underlying bowel and stool, CT of the abdomen/ pelvis is suggested to exclude a right inguinal hernia or intrapelvic process such as an abscess. No focal osseous lesions. Severe degenerative changes left hip. Aortoiliac atherosclerotic vascular calcification.  IMPRESSION: Mottled collection of air noted in the right lower pelvis. Although this could represent a prominent collection of stool and bowel, CT of the abdomen/ pelvis is suggested to exclude a right inguinal hernia or intrapelvic process such as an abscess. There is no evidence of bowel distention or free air.   Electronically Signed   By: Maisie Fus  Register   On: 12/31/2012 06:47        Scheduled Meds: . arformoterol  15 mcg Nebulization Q12H  . budesonide  0.25 mg  Nebulization BID  . Chlorhexidine Gluconate Cloth  6 each Topical Q0600  . digoxin  0.125 mg Oral Daily  . docusate sodium  100 mg Oral BID  . enoxaparin (LOVENOX) injection  40 mg Subcutaneous Q24H  . feeding supplement (GLUCERNA SHAKE)  237 mL Oral BID BM  . insulin aspart  0-5 Units Subcutaneous QHS  . insulin aspart  0-9 Units Subcutaneous TID WC  . [START ON 01/01/2013] levofloxacin (LEVAQUIN) IV  750 mg Intravenous Q48H  . metoprolol succinate  12.5 mg Oral BID  . montelukast  10 mg Oral Daily  . mupirocin ointment  1 application Nasal BID  . predniSONE  60 mg Oral Q breakfast  . simvastatin  20 mg Oral q1800  . sodium chloride  3 mL Intravenous Q12H  . tiotropium  18 mcg Inhalation Daily   Continuous Infusions:   Active Problems:   CHEST PAIN   HTN (hypertension)   Diabetes mellitus   Shortness of breath   COPD with exacerbation    Time spent: 35 minutes    Wynne Jury, MD, FACP, FHM. [facility] Pager [phone]  If 7PM-7AM, please contact night-coverage [email or web address] Password [email or web address] 12/31/2012, 3:04 PM    LOS: 1 day

## 2012-12-31 NOTE — Progress Notes (Signed)
*  PRELIMINARY RESULTS* Vascular Ultrasound Lower extremity venous duplex has been completed.  Preliminary findings: Right = no evidence of DVT. Left = Partial DVT involving the common femoral and popliteal veins.    Farrel Demark, RDMS, RVT  12/31/2012, 12:46 PM

## 2012-12-31 NOTE — Progress Notes (Signed)
ANTICOAGULATION CONSULT NOTE - Initial Consult  Pharmacy Consult for Lovenox Indication: DVT  Allergies  Allergen Reactions  . Epinephrine Other (See Comments)    Patient thinks her heart stopped and the MD said never to take this again.  Ardine Bjork [Iodinated Diagnostic Agents] Other (See Comments)    Unknown-patient said she thinks her heart stopped, but she really can't remember  . Asa [Aspirin] Other (See Comments)    Heart doctor told her to take advil instead   Patient Measurements: Height: 5\' 3"  (160 cm) Weight: 94 lb (42.638 kg) IBW/kg (Calculated) : 52.4 Heparin Dosing Weight:   Vital Signs: Temp: 98.4 F (36.9 C) (11/18 1343) Temp src: Oral (11/18 0605) BP: 115/61 mmHg (11/18 0605) Pulse Rate: 73 (11/18 1343)  Labs:  Recent Labs  12/30/12 1450 12/30/12 2245 12/31/12 0540 12/31/12 1010  HGB 12.5 12.3 11.4*  --   HCT 37.1 36.1 33.3*  --   PLT 167 171 177  --   CREATININE 0.58 0.62 0.65  --   TROPONINI  --  <0.30 <0.30 <0.30   Estimated Creatinine Clearance: 38.3 ml/min (by C-G formula based on Cr of 0.65).  Medical History: Past Medical History  Diagnosis Date  . Allergic rhinitis   . Diabetes mellitus   . COPD (chronic obstructive pulmonary disease)   . DVT (deep venous thrombosis)   . Syncope     St. Jude loop recorder implantation 03/2009  . LV dysfunction     Mild, EF 45-50% by echocardiogram 02/2009  . Shortness of breath   . Asthma   . Hypertension   . Headache(784.0)   . Anxiety   . Chronic cough   . Sinusitis   . Skin melanoma     "don't remember where"  . Supraventricular tachycardia     s/p RF ablation in 2000  . A-fib   . Blood transfusion   . Anemia   . H/O hiatal hernia   . Arthritis   . Angina     chronic chest pain   Assessment: 79 yo female with DVT history and dopplers showing a partial DVT in the left lower extremity.  We have been asked to start her on SQ Lovenox for treatment.  She is small with a weight of 42.6kg, she  has normal renal function with a creatinine of 0.65 and an estimated clearance of 38 ml/min.  Her CBC is stable as of this AM and she has no noted bleeding complications.  Goal of Therapy:  Anti-Xa level 0.6-1.2 units/ml 4hrs after LMWH dose given Monitor platelets by anticoagulation protocol: Yes   Plan:  1.  Will start her on Lovenox 40 mg subcutaneous injections every 12 hours. 2.  Monitor for s/s of bleeding with CBC every 72 hours 3.  Will f/u for start of oral DVT therapy.  Nadara Mustard, PharmD., MS Clinical Pharmacist Pager:  [phone] Thank you for allowing pharmacy to be part of this patients care team. 12/31/2012,4:38 PM

## 2012-12-31 NOTE — Progress Notes (Signed)
Met with patient at bedside to explain and offer [facility] Care Management services. Consents signed at bedside. Reports she could use the additional resource of [facility] Care Management for COPD management. States her son recently moved in with her. Left [facility] Care Management packet at bedside. Made her aware that she will receive post hospital discharge call and will be evaluated for monthly home visits.  Raiford Noble, MSN-Ed, RN, BSN- [facility] [phone]
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
## 2013-01-01 LAB — CBC
HCT: 34.1 % — ABNORMAL LOW (ref 36.0–46.0)
Hemoglobin: 11.6 g/dL — ABNORMAL LOW (ref 12.0–15.0)
MCH: 31.8 pg (ref 26.0–34.0)
MCHC: 34 g/dL (ref 30.0–36.0)
MCV: 93.4 fL (ref 78.0–100.0)
RDW: 13.8 % (ref 11.5–15.5)

## 2013-01-01 LAB — GLUCOSE, CAPILLARY
Glucose-Capillary: 125 mg/dL — ABNORMAL HIGH (ref 70–99)
Glucose-Capillary: 107 mg/dL — ABNORMAL HIGH (ref 70–99)

## 2013-01-01 LAB — BASIC METABOLIC PANEL
BUN: 18 mg/dL (ref 6–23)
Calcium: 9.4 mg/dL (ref 8.4–10.5)
Creatinine, Ser: 0.7 mg/dL (ref 0.50–1.10)
GFR calc Af Amer: >90 mL/min (ref >90)
GFR calc non Af Amer: 80 mL/min — ABNORMAL LOW (ref >90)
Glucose, Bld: 139 mg/dL — ABNORMAL HIGH (ref 70–99)
Potassium: 4.6 mEq/L (ref 3.5–5.1)

## 2013-01-01 MED ORDER — GLUCERNA SHAKE PO LIQD: 237.0000 mL | Freq: Two times a day (BID) | ORAL | Status: DC

## 2013-01-01 MED ORDER — METOPROLOL SUCCINATE ER 25 MG PO TB24: 12.5000 mg | ORAL_TABLET | Freq: Two times a day (BID) | ORAL | Status: DC

## 2013-01-01 MED ORDER — RIVAROXABAN 20 MG PO TABS: 20.0000 mg | ORAL_TABLET | Freq: Every day | ORAL | Status: DC

## 2013-01-01 MED ORDER — FORMOTEROL FUMARATE 20 MCG/2ML IN NEBU: 20.0000 ug | INHALATION_SOLUTION | Freq: Two times a day (BID) | RESPIRATORY_TRACT | Status: DC

## 2013-01-01 MED ORDER — SENNA 8.6 MG PO TABS: 2.0000 | ORAL_TABLET | Freq: Every day | ORAL | Status: DC

## 2013-01-01 MED ORDER — RIVAROXABAN 15 MG PO TABS
15.0000 mg | ORAL_TABLET | Freq: Two times a day (BID) | ORAL | Status: DC
  Administered 2013-01-01: 15 mg via ORAL
  Filled 2013-01-01 (×2): qty 1

## 2013-01-01 MED ORDER — POLYETHYLENE GLYCOL 3350 17 G PO PACK: 17.0000 g | PACK | Freq: Every day | ORAL | Status: DC

## 2013-01-01 MED ORDER — BUDESONIDE 0.25 MG/2ML IN SUSP: RESPIRATORY_TRACT | Status: DC

## 2013-01-01 MED ORDER — PREDNISONE 10 MG PO TABS: ORAL_TABLET | ORAL | Status: DC

## 2013-01-01 MED ORDER — LEVOFLOXACIN 750 MG PO TABS: 750.0000 mg | ORAL_TABLET | ORAL | Status: DC

## 2013-01-01 MED ORDER — RIVAROXABAN 15 MG PO TABS: 15.0000 mg | ORAL_TABLET | Freq: Two times a day (BID) | ORAL | Status: DC
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
## 2013-01-01 NOTE — Progress Notes (Signed)
ANTICOAGULATION CONSULT NOTE - Initial Consult  Pharmacy Consult for Xarelto Indication: DVT  Allergies  Allergen Reactions  . Epinephrine Other (See Comments)    Patient thinks her heart stopped and the MD said never to take this again.  Ardine Bjork [Iodinated Diagnostic Agents] Other (See Comments)    Unknown-patient said she thinks her heart stopped, but she really can't remember  . Asa [Aspirin] Other (See Comments)    Heart doctor told her to take advil instead   Patient Measurements: Height: 5\' 3"  (160 cm) Weight: 92 lb (41.731 kg) IBW/kg (Calculated) : 52.4   Vital Signs: Temp: 98.3 F (36.8 C) (11/19 1333) Temp src: Oral (11/19 1333) BP: 117/64 mmHg (11/19 1333) Pulse Rate: 68 (11/19 1333)  Labs:  Recent Labs  12/30/12 2245 12/31/12 0540 12/31/12 1010 01/01/13 0500  HGB 12.3 11.4*  --  11.6*  HCT 36.1 33.3*  --  34.1*  PLT 171 177  --  180  CREATININE 0.62 0.65  --  0.70  TROPONINI <0.30 <0.30 <0.30  --    Estimated Creatinine Clearance: 37.5 ml/min (by C-G formula based on Cr of 0.7).  Medical History: Past Medical History  Diagnosis Date  . Allergic rhinitis   . Diabetes mellitus   . COPD (chronic obstructive pulmonary disease)   . DVT (deep venous thrombosis)   . Syncope     St. Jude loop recorder implantation 03/2009  . LV dysfunction     Mild, EF 45-50% by echocardiogram 02/2009  . Shortness of breath   . Asthma   . Hypertension   . Headache(784.0)   . Anxiety   . Chronic cough   . Sinusitis   . Skin melanoma     "don't remember where"  . Supraventricular tachycardia     s/p RF ablation in 2000  . A-fib   . Blood transfusion   . Anemia   . H/O hiatal hernia   . Arthritis   . Angina     chronic chest pain   Assessment: 79 yo female with DVT history and dopplers showing a partial DVT in the left lower extremity.  Last night started on Lovenox treatment dose.  She is small with a weight of 41.7kg, she has normal renal function with a  creatinine of 0.7 and an estimated clearance of 38 ml/min.  Her CBC is stable , Hgb 11.6 and pltc 180K. No noted bleeding complications.  Goal of Therapy:  Anti-Xa level 0.6-1.2 units/ml 4hrs after LMWH dose given Monitor platelets by anticoagulation protocol: Yes   Plan:  1. Discontinue Lovenox now. 2. Start Xarelto 15 mg po BID x 21 days then 20 mg once daily with evening meal. Okay to start Xarelto now. 3.  Monitor for s/s of bleeding. 4. Monitor for renal insufficiency;  If CrCL falls <30 ml/min Xarelto should be discontinued and changed to alternate anticoagulant for DVT. Treatment.  Noah Delaine, RPh Clinical Pharmacist Pager: [phone] Thank you for allowing pharmacy to be part of this patients care team. 01/01/2013,2:18 PM

## 2013-01-01 NOTE — Evaluation (Signed)
Read, reviewed, and agree.  Knight Oelkers B. Shalanda Brogden, PT, DPT #[phone]  

## 2013-01-01 NOTE — Progress Notes (Addendum)
Pt provided with dc instructions and education. Pt aware of all new medications and how/when to take them. Pt able to verbalize understanding and teachback discharge instructions. IV removed with tip intact. Heart monitor cleaned and returned to front. Pt has no questions at this time. Xarelto given to patient early per Dr. Waymon Amato order. Levonne Spiller, RN   This RN scheduled follow appointments for Dr Jillyn Hidden 11/26 at 930am and Dr young 12/3 at 11am.

## 2013-01-01 NOTE — Progress Notes (Signed)
Pt woke 0130 01/01/13, disoriented to place.  RN unable to reorient pt.  Pt believes she is at home and keeps asking for son and her dogs.  Pt agitated, calling the RN a liar, telling RN to shut up, and attempting to leave room with no clothes on.  No other neurological changes present and vital stable. Pt baseline is alert and oriented x4 with some memory loss and frequent repetition of statements and question.  MD notified of change.   Will continue to monitor.

## 2013-01-01 NOTE — Evaluation (Signed)
Physical Therapy Evaluation Patient Details Name: [name] MRN: [id number] DOB: 02-01-1932 Today's Date: 01/01/2013 Time: 1308-6578 PT Time Calculation (min): 25 min  PT Assessment / Plan / Recommendation History of Present Illness  24 hour of worsening shortness of breath. She has had increased cough for the past fefw days. She started to wheeze and have done home inhalers but her shortness of breath increased and her family brought her to ER. In ER she developed chest pain lasting for 2 hours. This was associated with abdominal cramps and nausea. Denies any diarrhea. Chest pain now resolved. She is diabetic. States she got nauseous after eating supper in ER.   Clinical Impression   Patient is awake, alert, and oriented when session started.  Patient was independent with ADL's before admission, and plans to continue that after d/c.  Patient ambulates safely on flat ground and on stairs with supervision.  Pt has family nearby to provide assistance, and will be safe to d/c home with their assistance.      PT Assessment  Patent does not need any further PT services    Follow Up Recommendations  Supervision - Intermittent          Equipment Recommendations  None recommended by PT       Frequency  (NA)    Precautions / Restrictions Precautions Precautions: Fall Restrictions Weight Bearing Restrictions: No   Pertinent Vitals/Pain None reported      Mobility  Bed Mobility Bed Mobility: Not assessed Transfers Transfers: Sit to Stand Sit to Stand: 5: Supervision Details for Transfer Assistance: safe with supervision Ambulation/Gait Ambulation/Gait Assistance: 5: Supervision Ambulation Distance (Feet): 250 Feet Assistive device: None Ambulation/Gait Assistance Details: Has a limp from a previous hip injury, her gait has returned to baseline Gait Pattern: Step-through pattern Stairs: Yes Stairs Assistance: 5: Supervision Stair Management Technique: Two rails;Step to  pattern Number of Stairs: 3        PT Goals(Current goals can be found in the care plan section) Acute Rehab PT Goals Patient Stated Goal: return home PT Goal Formulation: No goals set, d/c therapy  Visit Information  Last PT Received On: 01/01/13 Assistance Needed: +1 History of Present Illness: 24 hour of worsening shortness of breath. She has had increased cough for the past fefw days. She started to wheeze and have done home inhalers but her shortness of breath increased and her family brought her to ER. In ER she developed chest pain lasting for 2 hours. This was associated with abdominal cramps and nausea. Denies any diarrhea. Chest pain now resolved. She is diabetic. States she got nauseous after eating supper in ER.        Prior Functioning  Home Living Family/patient expects to be discharged to:: Private residence Living Arrangements: Alone Available Help at Discharge: Neighbor;Family Type of Home: House Home Access: Stairs to enter Secretary/administrator of Steps: 2 Entrance Stairs-Rails: Right Home Equipment: None Communication Communication: No difficulties    Cognition  Cognition Arousal/Alertness: Awake/alert Behavior During Therapy: WFL for tasks assessed/performed Overall Cognitive Status: Within Functional Limits for tasks assessed    Extremity/Trunk Assessment Upper Extremity Assessment Upper Extremity Assessment: Defer to OT evaluation Lower Extremity Assessment Lower Extremity Assessment: Overall WFL for tasks assessed   Balance    End of Session PT - End of Session Equipment Utilized During Treatment: Gait belt Activity Tolerance: Patient tolerated treatment well Patient left: in bed;with call bell/phone within reach;with bed alarm set Nurse Communication: Mobility status  GP  Barrie Dunker, SPT Pager:  [phone]   Barrie Dunker 01/01/2013, 3:57 PM

## 2013-01-05 ENCOUNTER — Other Ambulatory Visit (HOSPITAL_COMMUNITY): Payer: Self-pay | Admitting: Internal Medicine

## 2013-01-11 ENCOUNTER — Other Ambulatory Visit: Payer: Self-pay | Admitting: Internal Medicine

## 2013-01-13 DIAGNOSIS — J309 Allergic rhinitis, unspecified: Secondary | ICD-10-CM | POA: Diagnosis not present

## 2013-01-13 DIAGNOSIS — R5381 Other malaise: Secondary | ICD-10-CM | POA: Diagnosis not present

## 2013-01-13 DIAGNOSIS — I82409 Acute embolism and thrombosis of unspecified deep veins of unspecified lower extremity: Secondary | ICD-10-CM | POA: Diagnosis not present

## 2013-01-13 DIAGNOSIS — R5383 Other fatigue: Secondary | ICD-10-CM | POA: Diagnosis not present

## 2013-01-13 DIAGNOSIS — E871 Hypo-osmolality and hyponatremia: Secondary | ICD-10-CM | POA: Diagnosis not present

## 2013-01-13 DIAGNOSIS — R269 Unspecified abnormalities of gait and mobility: Secondary | ICD-10-CM | POA: Diagnosis not present

## 2013-01-13 DIAGNOSIS — F411 Generalized anxiety disorder: Secondary | ICD-10-CM | POA: Diagnosis not present

## 2013-01-13 DIAGNOSIS — J441 Chronic obstructive pulmonary disease with (acute) exacerbation: Secondary | ICD-10-CM | POA: Diagnosis not present

## 2013-01-13 DIAGNOSIS — Z7901 Long term (current) use of anticoagulants: Secondary | ICD-10-CM | POA: Diagnosis not present

## 2013-01-15 ENCOUNTER — Encounter: Payer: Self-pay | Admitting: Internal Medicine

## 2013-01-15 ENCOUNTER — Ambulatory Visit (INDEPENDENT_AMBULATORY_CARE_PROVIDER_SITE_OTHER): Payer: Medicare Other | Admitting: Internal Medicine

## 2013-01-15 VITALS — BP 114/70 | HR 76 | Ht 62.0 in | Wt 96.4 lb

## 2013-01-15 DIAGNOSIS — J449 Chronic obstructive pulmonary disease, unspecified: Secondary | ICD-10-CM | POA: Diagnosis not present

## 2013-01-15 DIAGNOSIS — IMO0001 Reserved for inherently not codable concepts without codable children: Secondary | ICD-10-CM

## 2013-01-15 DIAGNOSIS — Z23 Encounter for immunization: Secondary | ICD-10-CM | POA: Diagnosis not present

## 2013-01-15 MED ORDER — BUDESONIDE 0.25 MG/2ML IN SUSP: RESPIRATORY_TRACT | Status: DC

## 2013-01-15 MED ORDER — FORMOTEROL FUMARATE 20 MCG/2ML IN NEBU: 20.0000 ug | INHALATION_SOLUTION | Freq: Two times a day (BID) | RESPIRATORY_TRACT | Status: DC
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
## 2013-01-15 NOTE — Patient Instructions (Addendum)
Pneumonia vaccine conjugate 13  Order- [facility] home nebulizer and supplies (has own machine)                              Script for Perforomist- 1 neb twice daily- printed

## 2013-01-21 ENCOUNTER — Telehealth: Payer: Self-pay | Admitting: Internal Medicine

## 2013-01-21 NOTE — Telephone Encounter (Signed)
She was dx'd with left femoral and popliteal DVT 12/31/12 by Doppler, with no PE. It looks as if she was discharged taking Xarelto 20 mg daily, so I don't understand when she changed to 15 mg daily. She also has hx of AFib. I don't understand the message from her son- What is 2 days early. Do they need refill for Xarelto? Are we managing this through the pulmonary office, or is Dr Jillyn Hidden or her cardiologist managing her anticoagulation??

## 2013-01-21 NOTE — Telephone Encounter (Signed)
Spoke with pt son. He reports pt was giving xarelto back in November from hosp admission. She was on 15 mg then on 01/22/13 start taking 20 mg. Pt ran out yesterday of 15 mg so he gave pt 20 mg yesterday. He wants to make sure this is okay since it was 2 days early. Please advise Dr. Maple Hudson thanks

## 2013-01-21 NOTE — Telephone Encounter (Signed)
Pt was taking 15 mg of xarelto. Per instructions she was to continue with this dose until 01/22/13 and then begin taking xarelto 20 mg. Pt began taking xarelto 20 mg yesterday (01/20/13). The reason is bc she ran out of the xarelto 15 mg tablets. He wants to make sure this is okay since they are 2 days ahead of schedule when she was to start taking xarelto 20 mg. thanks

## 2013-01-21 NOTE — Telephone Encounter (Signed)
Ok to take the 20 mg Xarelto

## 2013-01-21 NOTE — Telephone Encounter (Signed)
I called and made pt aware as son was gone. Nothing further needed

## 2013-02-03 ENCOUNTER — Ambulatory Visit: Payer: Medicare Other | Admitting: Internal Medicine

## 2013-02-03 ENCOUNTER — Other Ambulatory Visit (HOSPITAL_COMMUNITY): Payer: Self-pay | Admitting: Internal Medicine

## 2013-02-03 NOTE — Assessment & Plan Note (Signed)
Recent exacerbation now controlled. Plan-prescription refill for Perforomist, pneumococcal vaccine-13

## 2013-02-19 ENCOUNTER — Other Ambulatory Visit (HOSPITAL_COMMUNITY): Payer: Self-pay | Admitting: Internal Medicine

## 2013-02-27 DIAGNOSIS — E119 Type 2 diabetes mellitus without complications: Secondary | ICD-10-CM | POA: Diagnosis not present

## 2013-02-27 DIAGNOSIS — I10 Essential (primary) hypertension: Secondary | ICD-10-CM | POA: Diagnosis not present

## 2013-02-27 DIAGNOSIS — E785 Hyperlipidemia, unspecified: Secondary | ICD-10-CM | POA: Diagnosis not present

## 2013-02-27 DIAGNOSIS — E559 Vitamin D deficiency, unspecified: Secondary | ICD-10-CM | POA: Diagnosis not present

## 2013-03-10 DIAGNOSIS — E785 Hyperlipidemia, unspecified: Secondary | ICD-10-CM | POA: Diagnosis not present

## 2013-03-10 DIAGNOSIS — E119 Type 2 diabetes mellitus without complications: Secondary | ICD-10-CM | POA: Diagnosis not present

## 2013-03-10 DIAGNOSIS — E559 Vitamin D deficiency, unspecified: Secondary | ICD-10-CM | POA: Diagnosis not present

## 2013-03-10 DIAGNOSIS — I10 Essential (primary) hypertension: Secondary | ICD-10-CM | POA: Diagnosis not present

## 2013-04-16 ENCOUNTER — Ambulatory Visit: Payer: Medicare Other | Admitting: Internal Medicine

## 2013-04-22 DIAGNOSIS — R1900 Intra-abdominal and pelvic swelling, mass and lump, unspecified site: Secondary | ICD-10-CM | POA: Diagnosis not present

## 2013-04-22 DIAGNOSIS — R413 Other amnesia: Secondary | ICD-10-CM | POA: Diagnosis not present

## 2013-04-22 DIAGNOSIS — N390 Urinary tract infection, site not specified: Secondary | ICD-10-CM | POA: Diagnosis not present

## 2013-04-22 DIAGNOSIS — J069 Acute upper respiratory infection, unspecified: Secondary | ICD-10-CM | POA: Diagnosis not present

## 2013-04-22 DIAGNOSIS — R1084 Generalized abdominal pain: Secondary | ICD-10-CM | POA: Diagnosis not present

## 2013-04-22 DIAGNOSIS — R109 Unspecified abdominal pain: Secondary | ICD-10-CM | POA: Diagnosis not present

## 2013-04-27 ENCOUNTER — Other Ambulatory Visit: Payer: Self-pay | Admitting: Internal Medicine

## 2013-04-28 ENCOUNTER — Other Ambulatory Visit: Payer: Self-pay | Admitting: Family Medicine

## 2013-04-28 DIAGNOSIS — R1900 Intra-abdominal and pelvic swelling, mass and lump, unspecified site: Secondary | ICD-10-CM

## 2013-05-07 ENCOUNTER — Other Ambulatory Visit: Payer: Medicare Other

## 2013-05-15 ENCOUNTER — Encounter: Payer: Self-pay | Admitting: Internal Medicine

## 2013-05-15 ENCOUNTER — Ambulatory Visit (INDEPENDENT_AMBULATORY_CARE_PROVIDER_SITE_OTHER): Payer: Medicare Other | Admitting: Internal Medicine

## 2013-05-15 VITALS — BP 120/86 | HR 81 | Ht 62.0 in | Wt 98.4 lb

## 2013-05-15 DIAGNOSIS — J339 Nasal polyp, unspecified: Secondary | ICD-10-CM

## 2013-05-15 DIAGNOSIS — J329 Chronic sinusitis, unspecified: Secondary | ICD-10-CM

## 2013-05-15 DIAGNOSIS — J3089 Other allergic rhinitis: Secondary | ICD-10-CM

## 2013-05-15 DIAGNOSIS — J309 Allergic rhinitis, unspecified: Secondary | ICD-10-CM

## 2013-05-15 DIAGNOSIS — J302 Other seasonal allergic rhinitis: Secondary | ICD-10-CM

## 2013-05-15 MED ORDER — METHYLPREDNISOLONE ACETATE 80 MG/ML IJ SUSP
80.0000 mg | Freq: Once | INTRAMUSCULAR | Status: AC
  Administered 2013-05-15: 80 mg via INTRAMUSCULAR

## 2013-05-15 MED ORDER — LEVALBUTEROL HCL 0.63 MG/3ML IN NEBU
0.6300 mg | INHALATION_SOLUTION | Freq: Once | RESPIRATORY_TRACT | Status: AC
  Administered 2013-05-15: 0.63 mg via RESPIRATORY_TRACT

## 2013-05-15 NOTE — Assessment & Plan Note (Signed)
Seasonal exacerbation Plan- neb nasal, depomedrol, nasal steroid spray
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
## 2013-05-15 NOTE — Assessment & Plan Note (Signed)
Distinct R nasal polyp today Plan- nasal neb decongestant, depomedrol, otc nasal steroid spray

## 2013-05-15 NOTE — Patient Instructions (Addendum)
Neb neo nasal   Depo 80  Use either Flonase/ fluticasone or Nasacort/ triamcinolone otc nasal steroid spray    1-2 puffsin stuffy nostril daily to keep the polyps shrunk down

## 2013-07-03 ENCOUNTER — Ambulatory Visit: Payer: PRIVATE HEALTH INSURANCE | Admitting: Internal Medicine

## 2013-07-23 ENCOUNTER — Telehealth: Payer: Self-pay | Admitting: Internal Medicine

## 2013-07-23 DIAGNOSIS — IMO0001 Reserved for inherently not codable concepts without codable children: Secondary | ICD-10-CM

## 2013-07-23 NOTE — Telephone Encounter (Signed)
Pt states she has been trying to return to O2 system and tanks to the company where she got them-has been having to pay on them. No longer uses O2. Pt is unsure where she got O2 from-was given O2 from hospital. Pt aware that I will call DME's tomorrow to see where to return her tanks to.

## 2013-07-24 NOTE — Telephone Encounter (Signed)
Called and spoke with pt and she is aware of CY recs to do these tests prior to the oxygen being picked up ---in case she does need this.  Message sent to [facility] with [facility] to make her aware of orders placed.  Nothing further is needed.

## 2013-07-24 NOTE — Telephone Encounter (Signed)
We ought to get [place] room air and walk test on room air for dx COPD with chronic bronchitis, to see if she medically needs the oxygen before turning it in.

## 2013-07-24 NOTE — Telephone Encounter (Signed)
Called and spoke with [facility] and they did supply the oxygen for the pt.  Pt stated that she does not use the oxygen anymore and this is taking up too much space in her room.  Pt was requesting that this oxygen be picked up.  [facility] wanted to see if CY wanted the RT to go out and eval the pt before they pick the oxygen up to make sure she does not need this.  CY please advise. Thanks  Last ov--05/15/2013 No pending appt   Allergies  Allergen Reactions  . Epinephrine Other (See Comments)    Patient thinks her heart stopped and the MD said never to take this again.  Clementeen Hoof [Iodinated Diagnostic Agents] Other (See Comments)    Unknown-patient said she thinks her heart stopped, but she really can't remember  . Asa [Aspirin] Other (See Comments)    Heart doctor told her to take advil instead     Current Outpatient Prescriptions on File Prior to Visit  Medication Sig Dispense Refill  . albuterol (PROAIR HFA) 108 (90 BASE) MCG/ACT inhaler Inhale 2 puffs into the lungs every 6 (six) hours as needed for wheezing.  1 Inhaler  4  . budesonide (PULMICORT) 0.25 MG/2ML nebulizer solution Use 1 vial in neb BID.  Rinse mouth out after treatment.  Dx: 491.20  120 mL  prn  . digoxin (LANOXIN) 0.125 MG tablet Take 0.125 mg by mouth daily.      . feeding supplement, GLUCERNA SHAKE, (GLUCERNA SHAKE) LIQD Take 237 mLs by mouth 2 (two) times daily between meals.      . formoterol (PERFOROMIST) 20 MCG/2ML nebulizer solution Take 2 mLs (20 mcg total) by nebulization 2 (two) times daily. Dx 496  120 mL  prn  . metoprolol succinate (TOPROL-XL) 25 MG 24 hr tablet Take 0.5 tablets (12.5 mg total) by mouth 2 (two) times daily.  30 tablet  0  . montelukast (SINGULAIR) 10 MG tablet TAKE 1 TABLET BY MOUTH ONCE DAILY  30 tablet  6  . polyethylene glycol (MIRALAX / GLYCOLAX) packet Take 17 g by mouth daily.  14 each  0  . pravastatin (PRAVACHOL) 40 MG tablet Take 40 mg by mouth daily.       Marland Kitchen SPIRIVA HANDIHALER 18 MCG inhalation  capsule INHALE 1 CAPSULE VIA HANDIHALER ONCE DAILY AT THE SAME TIME EVERY DAY  30 capsule  2  . Vitamin D, Ergocalciferol, (DRISDOL) 50000 UNITS CAPS capsule Take 50,000 Units by mouth every 7 (seven) days. Take On Friday       No current facility-administered medications on file prior to visit.

## 2013-08-14 ENCOUNTER — Other Ambulatory Visit: Payer: Self-pay | Admitting: Internal Medicine

## 2013-08-24 ENCOUNTER — Other Ambulatory Visit: Payer: Self-pay | Admitting: Internal Medicine

## 2013-09-15 ENCOUNTER — Ambulatory Visit: Payer: Medicare Other | Admitting: Internal Medicine

## 2013-09-23 DIAGNOSIS — M87059 Idiopathic aseptic necrosis of unspecified femur: Secondary | ICD-10-CM | POA: Diagnosis not present

## 2013-09-30 ENCOUNTER — Other Ambulatory Visit: Payer: Self-pay | Admitting: Internal Medicine

## 2013-09-30 ENCOUNTER — Telehealth: Payer: Self-pay | Admitting: *Deleted

## 2013-09-30 NOTE — Telephone Encounter (Signed)
Received request for medical clearance from [facility] will need appointment. Staff message sent to Dr. Tanna Furry scheduler.

## 2013-10-20 ENCOUNTER — Other Ambulatory Visit: Payer: Self-pay | Admitting: Internal Medicine

## 2013-10-21 NOTE — Telephone Encounter (Signed)
Patient has not been seen in over 2 yrs and has had multiple warnings to schedule an appointment and has still failed to do so. She was last given 14 pills on 10/02/13. Ok to deny or refill again? Please advise. Thanks, MI

## 2013-10-24 ENCOUNTER — Telehealth: Payer: Self-pay | Admitting: Internal Medicine

## 2013-10-24 NOTE — Telephone Encounter (Signed)
Pt needed an appt to get refill.  Nothing further needed at this time.

## 2013-10-24 NOTE — Telephone Encounter (Signed)
Pt returning call.[name] ° °

## 2013-10-24 NOTE — Telephone Encounter (Signed)
Called spoke with pt. Aware she needs to call cards for refill. Nothing further needed

## 2013-10-24 NOTE — Telephone Encounter (Signed)
i attempted to call the pt and was placed on hold by the pts son.  Then got disconnected.  Attempted to call back but the line was busy.  Will try back later.  It looks like the pt needs the appt with cardiology for this refill.  CY does not refill this medication.Marland Kitchen

## 2013-11-03 ENCOUNTER — Other Ambulatory Visit: Payer: Self-pay | Admitting: Internal Medicine

## 2013-11-03 NOTE — Telephone Encounter (Signed)
refill 

## 2013-12-07 ENCOUNTER — Other Ambulatory Visit: Payer: Self-pay | Admitting: Internal Medicine

## 2013-12-17 ENCOUNTER — Telehealth: Payer: Self-pay | Admitting: Internal Medicine

## 2013-12-17 NOTE — Telephone Encounter (Signed)
I spoke with [name]-she states she has to depend on a ride to come to her appts; I have placed [name] on the schedule to see CY on Tuesday 12-23-13 at 4:15pm slot with the understanding [name] will be her by 4:30pm. Nothing more needed at this time.

## 2013-12-17 NOTE — Telephone Encounter (Signed)
Please advise Dr. Annamaria Boots if this is possible? thanks

## 2013-12-17 NOTE — Telephone Encounter (Signed)
Please talk with Joellen Jersey about my schedule

## 2013-12-18 ENCOUNTER — Ambulatory Visit: Payer: Medicare Other | Admitting: Internal Medicine

## 2013-12-23 ENCOUNTER — Ambulatory Visit: Payer: Medicare Other | Admitting: Internal Medicine

## 2013-12-24 ENCOUNTER — Telehealth: Payer: Self-pay | Admitting: Internal Medicine

## 2013-12-24 NOTE — Telephone Encounter (Signed)
LMTCB

## 2013-12-25 NOTE — Telephone Encounter (Signed)
lmtcb x2 

## 2013-12-25 NOTE — Telephone Encounter (Signed)
Called and spoke to [place], pt's son. Appt made for 11/16 at 4:15. Richard verbalized understanding and denied any further questions or concerns at this time.

## 2013-12-25 NOTE — Telephone Encounter (Signed)
Spoke with pt's son.  Pt got mixed up on her appts and missed one.  He brings her to her appts and needs a 4:30 time slot due to his work hours.  Please advise if ok to use a 4:30 time slot.

## 2013-12-25 NOTE — Telephone Encounter (Signed)
Ok

## 2013-12-29 ENCOUNTER — Ambulatory Visit: Payer: Medicare Other | Admitting: Internal Medicine

## 2013-12-30 ENCOUNTER — Ambulatory Visit (INDEPENDENT_AMBULATORY_CARE_PROVIDER_SITE_OTHER): Payer: Medicare Other | Admitting: Internal Medicine

## 2013-12-30 ENCOUNTER — Encounter: Payer: Self-pay | Admitting: Internal Medicine

## 2013-12-30 VITALS — BP 122/80 | HR 80 | Ht 62.0 in | Wt 100.4 lb

## 2013-12-30 DIAGNOSIS — Z7189 Other specified counseling: Secondary | ICD-10-CM | POA: Insufficient documentation

## 2013-12-30 DIAGNOSIS — Z01811 Encounter for preprocedural respiratory examination: Secondary | ICD-10-CM

## 2013-12-30 DIAGNOSIS — R0602 Shortness of breath: Secondary | ICD-10-CM | POA: Diagnosis not present

## 2013-12-30 DIAGNOSIS — R55 Syncope and collapse: Secondary | ICD-10-CM | POA: Diagnosis not present

## 2013-12-30 DIAGNOSIS — I480 Paroxysmal atrial fibrillation: Secondary | ICD-10-CM | POA: Diagnosis not present

## 2013-12-30 DIAGNOSIS — I10 Essential (primary) hypertension: Secondary | ICD-10-CM | POA: Diagnosis not present

## 2013-12-30 DIAGNOSIS — R0789 Other chest pain: Secondary | ICD-10-CM

## 2013-12-30 NOTE — Assessment & Plan Note (Signed)
Her blood pressure now tends on the low side. No change in medical therapy at this point, she will remain on low-dose metoprolol.

## 2013-12-30 NOTE — Patient Instructions (Signed)
Your physician has requested that you have a lexiscan myoview. For further information please visit [email or web address]. Please follow instruction sheet, as given.   

## 2013-12-30 NOTE — Assessment & Plan Note (Signed)
She has had no recurrent syncope. We can no longer interrogate her loop recorder. We discussed watchful waiting. Because of her advanced age, I do not anticipate removing the implantable loop recorder.

## 2013-12-30 NOTE — Assessment & Plan Note (Signed)
With her advanced age and propensity for falling, I would not anticipate systemic anticoagulation.

## 2013-12-30 NOTE — Assessment & Plan Note (Signed)
The patient is considering hip replacement surgery. Her surgical risk is partly related to the results of her stress test. We'll make additional recommendations based on the stress test results.
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
## 2013-12-30 NOTE — Assessment & Plan Note (Signed)
The etiology is unclear. She has multiple cardiac risk factors. She is unable to ambulate and uses a walker. She will undergo Lexiscan Myoview perfusion stress testing.

## 2014-01-05 ENCOUNTER — Encounter (HOSPITAL_COMMUNITY): Payer: Self-pay | Admitting: Family Medicine

## 2014-01-05 ENCOUNTER — Emergency Department (INDEPENDENT_AMBULATORY_CARE_PROVIDER_SITE_OTHER)
Admission: EM | Admit: 2014-01-05 | Discharge: 2014-01-05 | Disposition: A | Discharge status: Home / Self Care | Payer: Medicare Other | Source: Home / Self Care | Attending: Family Medicine | Admitting: Family Medicine

## 2014-01-05 DIAGNOSIS — J302 Other seasonal allergic rhinitis: Secondary | ICD-10-CM | POA: Diagnosis not present

## 2014-01-05 DIAGNOSIS — L03811 Cellulitis of head [any part, except face]: Secondary | ICD-10-CM | POA: Diagnosis not present

## 2014-01-05 DIAGNOSIS — L859 Epidermal thickening, unspecified: Secondary | ICD-10-CM | POA: Diagnosis not present

## 2014-01-05 MED ORDER — FLUTICASONE PROPIONATE 50 MCG/ACT NA SUSP: 2.0000 | Freq: Every day | NASAL | Status: DC

## 2014-01-05 MED ORDER — CEPHALEXIN 500 MG PO CAPS: 500.0000 mg | ORAL_CAPSULE | Freq: Three times a day (TID) | ORAL | Status: DC

## 2014-01-05 NOTE — Discharge Instructions (Signed)
The growth on the back of your head is likely infected adn will need antibiotics Please start the Keflex and take it to completion Please also consider taking either a pro-biotic or daily yogurt to replenish the bacteria in your stomach Please use 400-600mg  of ibuprofen or advil for the pain Please use Afrin from time to time, daily nasal saline, and daily flonase for your nasal congestion Also consider taking a daily allergy pill

## 2014-01-05 NOTE — ED Notes (Signed)
Pt has been complaining of right facial pain that radiates into the back of her neck since yesterday.  No slurred speech or facial droop present.  She states she feels it may be coming from a growth on the right top of her head.
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
## 2014-01-22 ENCOUNTER — Encounter (HOSPITAL_COMMUNITY): Payer: Self-pay | Admitting: Cardiovascular Disease

## 2014-02-16 ENCOUNTER — Telehealth: Payer: Self-pay | Admitting: Internal Medicine

## 2014-02-16 NOTE — Telephone Encounter (Signed)
Have made 5 attempts to schedule Myoview.   Spoke with patient on 11-24 she stated that she need to speak with her son regarding the appointment and will call back.  On 01-21-14 she still had not spoke with her son.   Left voice mail on 12-15, 12-29 and today.   Will defer x 2 months.

## 2014-02-23 ENCOUNTER — Encounter (INDEPENDENT_AMBULATORY_CARE_PROVIDER_SITE_OTHER): Payer: Self-pay

## 2014-02-23 ENCOUNTER — Other Ambulatory Visit: Payer: Medicare Other

## 2014-02-23 ENCOUNTER — Encounter: Payer: Self-pay | Admitting: Internal Medicine

## 2014-02-23 ENCOUNTER — Ambulatory Visit (INDEPENDENT_AMBULATORY_CARE_PROVIDER_SITE_OTHER): Payer: Medicare Other | Admitting: Internal Medicine

## 2014-02-23 VITALS — BP 102/70 | HR 67 | Ht 62.0 in | Wt 95.4 lb

## 2014-02-23 DIAGNOSIS — J449 Chronic obstructive pulmonary disease, unspecified: Secondary | ICD-10-CM | POA: Diagnosis not present

## 2014-02-23 DIAGNOSIS — J339 Nasal polyp, unspecified: Secondary | ICD-10-CM

## 2014-02-23 DIAGNOSIS — Z23 Encounter for immunization: Secondary | ICD-10-CM | POA: Diagnosis not present

## 2014-02-23 DIAGNOSIS — IMO0001 Reserved for inherently not codable concepts without codable children: Secondary | ICD-10-CM
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
## 2014-02-23 NOTE — Patient Instructions (Addendum)
Flu vax   Use your oxygen all night every night for sleep at 2 Liters/ minute  The phone number for [facility] durable medical equipment company is:    You can call them   We can contact [facility] to have them provide for your nebulizer machine supplies and medication for the machine through Medicare Part B if you qualify.  Use your Flonase/ fluticasone nasal spray 2 puffs each nostril every night at bedtime. We are trying to shrink down the polyps in your nose so you can breathe better.  Order- lab    Allergy profile, Aspirin IgE     Dx nasal polyps, bilateral   2 names of Plastic surgeons for the growth on your scalp:    Tressia Miners Best

## 2014-02-24 LAB — ALLERGY FULL PROFILE
Allergen, D pternoyssinus,d7: <0.1 kU/L
Candida Albicans: <0.1 kU/L
Cat Dander: <0.1 kU/L
Common Ragweed: <0.1 kU/L
D. farinae: <0.1 kU/L
Fescue: <0.1 kU/L
G005 Rye, Perennial: <0.1 kU/L
Goldenrod: <0.1 kU/L
Helminthosporium halodes: <0.1 kU/L
IgE (Immunoglobulin E), Serum: 38 kU/L (ref <115)
Lamb's Quarters: <0.1 kU/L
Oak: <0.1 kU/L
Plantain: <0.1 kU/L
Stemphylium Botryosum: <0.1 kU/L
Sycamore Tree: <0.1 kU/L

## 2014-02-27 LAB — ALLERGEN ASPIRIN/SALICYLIC ACID IGE: Aspirin: NEGATIVE

## 2014-03-01 NOTE — Assessment & Plan Note (Signed)
Persistent nasal stuffiness and visible nasal polyps without recognized history of aspirin allergy Plan-uses Flonase daily, lab for allergy profile and for aspirin IgE

## 2014-03-01 NOTE — Assessment & Plan Note (Addendum)
Chronic bronchitis with cough but without acute exacerbation at this visit Plan-continue present meds, flu shot

## 2014-03-04 ENCOUNTER — Emergency Department (HOSPITAL_COMMUNITY): Payer: Medicare Other

## 2014-03-04 ENCOUNTER — Inpatient Hospital Stay (HOSPITAL_COMMUNITY)
Admission: EM | Admit: 2014-03-04 | Discharge: 2014-03-06 | DRG: 192 | Disposition: A | Discharge status: Home / Self Care | Payer: Medicare Other | Attending: Internal Medicine | Admitting: Internal Medicine

## 2014-03-04 ENCOUNTER — Encounter (HOSPITAL_COMMUNITY): Payer: Self-pay | Admitting: *Deleted

## 2014-03-04 DIAGNOSIS — J441 Chronic obstructive pulmonary disease with (acute) exacerbation: Secondary | ICD-10-CM | POA: Diagnosis present

## 2014-03-04 DIAGNOSIS — R079 Chest pain, unspecified: Secondary | ICD-10-CM | POA: Insufficient documentation

## 2014-03-04 DIAGNOSIS — J449 Chronic obstructive pulmonary disease, unspecified: Secondary | ICD-10-CM | POA: Diagnosis present

## 2014-03-04 DIAGNOSIS — Z86718 Personal history of other venous thrombosis and embolism: Secondary | ICD-10-CM | POA: Diagnosis not present

## 2014-03-04 DIAGNOSIS — R0602 Shortness of breath: Secondary | ICD-10-CM | POA: Diagnosis not present

## 2014-03-04 DIAGNOSIS — E119 Type 2 diabetes mellitus without complications: Secondary | ICD-10-CM | POA: Diagnosis present

## 2014-03-04 DIAGNOSIS — E860 Dehydration: Secondary | ICD-10-CM | POA: Diagnosis present

## 2014-03-04 DIAGNOSIS — Z7952 Long term (current) use of systemic steroids: Secondary | ICD-10-CM | POA: Diagnosis not present

## 2014-03-04 DIAGNOSIS — Z886 Allergy status to analgesic agent status: Secondary | ICD-10-CM

## 2014-03-04 DIAGNOSIS — E785 Hyperlipidemia, unspecified: Secondary | ICD-10-CM | POA: Diagnosis present

## 2014-03-04 DIAGNOSIS — Z833 Family history of diabetes mellitus: Secondary | ICD-10-CM | POA: Diagnosis not present

## 2014-03-04 DIAGNOSIS — Z91041 Radiographic dye allergy status: Secondary | ICD-10-CM

## 2014-03-04 DIAGNOSIS — Z9981 Dependence on supplemental oxygen: Secondary | ICD-10-CM | POA: Diagnosis not present

## 2014-03-04 DIAGNOSIS — Z888 Allergy status to other drugs, medicaments and biological substances status: Secondary | ICD-10-CM

## 2014-03-04 DIAGNOSIS — I10 Essential (primary) hypertension: Secondary | ICD-10-CM | POA: Diagnosis present

## 2014-03-04 DIAGNOSIS — Z7951 Long term (current) use of inhaled steroids: Secondary | ICD-10-CM

## 2014-03-04 DIAGNOSIS — I951 Orthostatic hypotension: Secondary | ICD-10-CM | POA: Diagnosis present

## 2014-03-04 DIAGNOSIS — E780 Pure hypercholesterolemia: Secondary | ICD-10-CM | POA: Diagnosis present

## 2014-03-04 DIAGNOSIS — Z8249 Family history of ischemic heart disease and other diseases of the circulatory system: Secondary | ICD-10-CM

## 2014-03-04 DIAGNOSIS — R069 Unspecified abnormalities of breathing: Secondary | ICD-10-CM | POA: Diagnosis not present

## 2014-03-04 DIAGNOSIS — R0600 Dyspnea, unspecified: Secondary | ICD-10-CM

## 2014-03-04 HISTORY — DX: Type 2 diabetes mellitus without complications: E11.9

## 2014-03-04 HISTORY — DX: Depression, unspecified: F32.A

## 2014-03-04 HISTORY — DX: Other long term (current) drug therapy: Z79.899

## 2014-03-04 HISTORY — DX: Major depressive disorder, single episode, unspecified: F32.9

## 2014-03-04 HISTORY — DX: Dependence on supplemental oxygen: Z99.81

## 2014-03-04 HISTORY — DX: Pure hypercholesterolemia, unspecified: E78.00

## 2014-03-04 LAB — BASIC METABOLIC PANEL WITH GFR
Anion gap: 11 (ref 5–15)
BUN: 17 mg/dL (ref 6–23)
CO2: 26 mmol/L (ref 19–32)
Calcium: 9.3 mg/dL (ref 8.4–10.5)
Chloride: 98 meq/L (ref 96–112)
Creatinine, Ser: 0.67 mg/dL (ref 0.50–1.10)
GFR calc Af Amer: >90 mL/min (ref >90)
GFR calc non Af Amer: 81 mL/min — ABNORMAL LOW (ref >90)
Glucose, Bld: 165 mg/dL — ABNORMAL HIGH (ref 70–99)
Potassium: 3.7 mmol/L (ref 3.5–5.1)
Sodium: 135 mmol/L (ref 135–145)

## 2014-03-04 LAB — CBC
HCT: 38.5 % (ref 36.0–46.0)
Hemoglobin: 12.8 g/dL (ref 12.0–15.0)
MCH: 31.7 pg (ref 26.0–34.0)
MCHC: 33.2 g/dL (ref 30.0–36.0)
MCV: 95.3 fL (ref 78.0–100.0)
Platelets: 183 K/uL (ref 150–400)
RBC: 4.04 MIL/uL (ref 3.87–5.11)
RDW: 13.5 % (ref 11.5–15.5)
WBC: 3.6 K/uL — ABNORMAL LOW (ref 4.0–10.5)

## 2014-03-04 LAB — TROPONIN I
Troponin I: 0.49 ng/mL — ABNORMAL HIGH (ref <0.031)
Troponin I: 0.34 ng/mL — ABNORMAL HIGH (ref <0.031)

## 2014-03-04 LAB — I-STAT TROPONIN, ED: Troponin i, poc: 0.04 ng/mL (ref 0.00–0.08)

## 2014-03-04 LAB — BRAIN NATRIURETIC PEPTIDE: B Natriuretic Peptide: 81.2 pg/mL (ref 0.0–100.0)

## 2014-03-04 LAB — MRSA PCR SCREENING: MRSA BY PCR: NEGATIVE

## 2014-03-04 MED ORDER — PREDNISONE 20 MG PO TABS: 60.0000 mg | ORAL_TABLET | Freq: Every day | ORAL | Status: DC

## 2014-03-04 MED ORDER — MONTELUKAST SODIUM 10 MG PO TABS
10.0000 mg | ORAL_TABLET | Freq: Every day | ORAL | Status: DC
  Administered 2014-03-05 – 2014-03-06 (×2): 10 mg via ORAL
  Filled 2014-03-04 (×2): qty 1

## 2014-03-04 MED ORDER — ENOXAPARIN SODIUM 40 MG/0.4ML ~~LOC~~ SOLN
40.0000 mg | SUBCUTANEOUS | Status: DC
Start: 2014-03-04 — End: 2014-03-04

## 2014-03-04 MED ORDER — DIGOXIN 125 MCG PO TABS
0.1250 mg | ORAL_TABLET | Freq: Every day | ORAL | Status: DC
  Administered 2014-03-05 – 2014-03-06 (×2): 0.125 mg via ORAL
  Filled 2014-03-04 (×2): qty 1

## 2014-03-04 MED ORDER — BUDESONIDE 0.25 MG/2ML IN SUSP
0.2500 mg | Freq: Every day | RESPIRATORY_TRACT | Status: DC
  Administered 2014-03-05 – 2014-03-06 (×2): 0.25 mg via RESPIRATORY_TRACT
  Filled 2014-03-04 (×2): qty 2

## 2014-03-04 MED ORDER — ONDANSETRON HCL 4 MG/2ML IJ SOLN: 4.0000 mg | Freq: Four times a day (QID) | INTRAMUSCULAR | Status: DC | PRN

## 2014-03-04 MED ORDER — ACETAMINOPHEN 325 MG PO TABS
650.0000 mg | ORAL_TABLET | Freq: Four times a day (QID) | ORAL | Status: DC | PRN
  Administered 2014-03-04: 650 mg via ORAL
  Filled 2014-03-04: qty 2

## 2014-03-04 MED ORDER — FLUTICASONE PROPIONATE 50 MCG/ACT NA SUSP: 1.0000 | Freq: Every day | NASAL | Status: DC | PRN

## 2014-03-04 MED ORDER — SODIUM CHLORIDE 0.9 % IV SOLN: INTRAVENOUS | Status: DC

## 2014-03-04 MED ORDER — ENOXAPARIN SODIUM 30 MG/0.3ML ~~LOC~~ SOLN
20.0000 mg | SUBCUTANEOUS | Status: DC
  Administered 2014-03-04: 20 mg via SUBCUTANEOUS
  Filled 2014-03-04 (×2): qty 0.2
  Filled 2014-03-04: qty 0.3
  Filled 2014-03-04 (×2): qty 0.2

## 2014-03-04 MED ORDER — ONDANSETRON HCL 4 MG PO TABS: 4.0000 mg | ORAL_TABLET | Freq: Four times a day (QID) | ORAL | Status: DC | PRN

## 2014-03-04 MED ORDER — LEVOFLOXACIN 500 MG PO TABS
500.0000 mg | ORAL_TABLET | Freq: Every day | ORAL | Status: DC
  Administered 2014-03-04: 500 mg via ORAL
  Filled 2014-03-04: qty 1

## 2014-03-04 MED ORDER — METHYLPREDNISOLONE SODIUM SUCC 125 MG IJ SOLR
125.0000 mg | Freq: Once | INTRAMUSCULAR | Status: AC
  Administered 2014-03-04: 125 mg via INTRAVENOUS
  Filled 2014-03-04: qty 2

## 2014-03-04 MED ORDER — SODIUM CHLORIDE 0.9 % IV SOLN
INTRAVENOUS | Status: DC
  Administered 2014-03-04 – 2014-03-05 (×2): via INTRAVENOUS

## 2014-03-04 MED ORDER — SODIUM CHLORIDE 0.9 % IV BOLUS (SEPSIS)
500.0000 mL | Freq: Once | INTRAVENOUS | Status: AC
  Administered 2014-03-04: 500 mL via INTRAVENOUS

## 2014-03-04 MED ORDER — PREDNISONE 20 MG PO TABS
60.0000 mg | ORAL_TABLET | Freq: Every day | ORAL | Status: DC
  Administered 2014-03-05 – 2014-03-06 (×2): 60 mg via ORAL
  Filled 2014-03-04 (×2): qty 3

## 2014-03-04 MED ORDER — CETYLPYRIDINIUM CHLORIDE 0.05 % MT LIQD
7.0000 mL | Freq: Two times a day (BID) | OROMUCOSAL | Status: DC
  Administered 2014-03-05: 7 mL via OROMUCOSAL

## 2014-03-04 MED ORDER — IPRATROPIUM-ALBUTEROL 0.5-2.5 (3) MG/3ML IN SOLN
3.0000 mL | RESPIRATORY_TRACT | Status: DC
  Administered 2014-03-04 – 2014-03-05 (×6): 3 mL via RESPIRATORY_TRACT
  Filled 2014-03-04 (×6): qty 3

## 2014-03-04 MED ORDER — ARFORMOTEROL TARTRATE 15 MCG/2ML IN NEBU
15.0000 ug | INHALATION_SOLUTION | Freq: Two times a day (BID) | RESPIRATORY_TRACT | Status: DC
  Administered 2014-03-05: 15 ug via RESPIRATORY_TRACT
  Filled 2014-03-04 (×6): qty 2

## 2014-03-04 NOTE — ED Notes (Addendum)
Acute respiratory distress that started this morning. According to EMS, "rales from nipple line up; audible wheezing." Ant. Chest wall pressure/pain. Multiple episodes of emesis. EMS gave albuterol 5 mg nebulizer which has helped some. EMS gave x 0.4 mg of ntg. Sl for the chest pain. Chest pain was 7/10 and is now 0.  EMS also gave 324 mg of asa. EMS also started a piv and gave zofraN  Mg ivp.  Initial bp 160/108. Pt,. States, "thick yellow mucus; been seeing my pcp."
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
## 2014-03-05 ENCOUNTER — Inpatient Hospital Stay (HOSPITAL_COMMUNITY): Payer: Medicare Other

## 2014-03-05 DIAGNOSIS — I10 Essential (primary) hypertension: Secondary | ICD-10-CM

## 2014-03-05 DIAGNOSIS — Z86718 Personal history of other venous thrombosis and embolism: Secondary | ICD-10-CM

## 2014-03-05 DIAGNOSIS — R079 Chest pain, unspecified: Secondary | ICD-10-CM

## 2014-03-05 LAB — LIPID PANEL
Cholesterol: 153 mg/dL (ref 0–200)
HDL: 65 mg/dL (ref >39)
LDL CALC: 81 mg/dL (ref 0–99)
Total CHOL/HDL Ratio: 2.4 RATIO
Triglycerides: 34 mg/dL (ref <150)
VLDL: 7 mg/dL (ref 0–40)

## 2014-03-05 LAB — D-DIMER, QUANTITATIVE (NOT AT ARMC): D-Dimer, Quant: 0.68 ug/mL-FEU — ABNORMAL HIGH (ref 0.00–0.48)

## 2014-03-05 LAB — TROPONIN I: Troponin I: 0.16 ng/mL — ABNORMAL HIGH (ref <0.031)

## 2014-03-05 MED ORDER — IPRATROPIUM-ALBUTEROL 0.5-2.5 (3) MG/3ML IN SOLN
3.0000 mL | Freq: Three times a day (TID) | RESPIRATORY_TRACT | Status: DC
  Administered 2014-03-06 (×2): 3 mL via RESPIRATORY_TRACT
  Filled 2014-03-05 (×2): qty 3

## 2014-03-05 MED ORDER — PRAVASTATIN SODIUM 40 MG PO TABS
40.0000 mg | ORAL_TABLET | Freq: Every day | ORAL | Status: DC
  Administered 2014-03-05: 40 mg via ORAL
  Filled 2014-03-05: qty 1

## 2014-03-05 MED ORDER — LEVOFLOXACIN 250 MG PO TABS
250.0000 mg | ORAL_TABLET | Freq: Every evening | ORAL | Status: DC
  Administered 2014-03-05: 250 mg via ORAL
  Filled 2014-03-05: qty 1

## 2014-03-05 MED ORDER — DOBUTAMINE INFUSION FOR EP/ECHO/NUC (1000 MCG/ML)
0.0000 ug/kg/min | INTRAVENOUS | Status: DC
  Filled 2014-03-05: qty 250

## 2014-03-05 NOTE — Progress Notes (Signed)
Dicussed with Dr. Harrington Challenger, lower extremity venous doppler negative. Will proceed with stress test tomorrow. Per Dr. Harrington Challenger, the reason she did not order lexiscan myoview was because patient received steroid.   Hilbert Corrigan PA Pager: [phone]

## 2014-03-05 NOTE — Progress Notes (Addendum)
PROGRESS NOTE  [name] IPJ:[id number] DOB: 12/31/1931 DOA: 03/04/2014 PCP: Antony Blackbird, MD  Assessment/Plan: MILD copd exacerbation with bronchitis: - steroids, - neb treatments.  -levaquin for bronchitis.  -O2- wears at night 2L and as needed during the day -d dimer elevated, duplex LE negative--- ? CTA- suspect V/Q would not be best due to her COPD.  Orthostatic hypotension: S/p IVF   Chest discomfort:  Mild elevation of troponins -stress test pending  Code Status: full Family Communication: patient Disposition Plan:    Consultants:  cards  Procedures:      HPI/Subjective: No CP, no SOB   Objective: Filed Vitals:   03/05/14 0431  BP: 121/58  Pulse: 86  Temp: 97.2 F (36.2 C)  Resp: 18    Intake/Output Summary (Last 24 hours) at 03/05/14 0847 Last data filed at 03/05/14 0800  Gross per 24 hour  Intake    240 ml  Output    625 ml  Net   -385 ml   Filed Weights   03/04/14 1307 03/04/14 1614 03/05/14 0431  Weight: 44.453 kg (98 lb) 43.591 kg (96 lb 1.6 oz) 44.634 kg (98 lb 6.4 oz)    Exam:   General:  Pleasant/cooeprative, NAD  Cardiovascular: diminished  Respiratory: rrr  Abdomen: +BS, soft  Musculoskeletal: no edema   Data Reviewed: Basic Metabolic Panel:  Recent Labs Lab 03/04/14 1327  NA 135  K 3.7  CL 98  CO2 26  GLUCOSE 165*  BUN 17  CREATININE 0.67  CALCIUM 9.3   Liver Function Tests: No results for input(s): AST, ALT, ALKPHOS, BILITOT, PROT, ALBUMIN in the last 168 hours. No results for input(s): LIPASE, AMYLASE in the last 168 hours. No results for input(s): AMMONIA in the last 168 hours. CBC:  Recent Labs Lab 03/04/14 1327  WBC 3.6*  HGB 12.8  HCT 38.5  MCV 95.3  PLT 183   Cardiac Enzymes:  Recent Labs Lab 03/04/14 1808 03/04/14 2235 03/05/14 0518  TROPONINI 0.49* 0.34* 0.16*   BNP (last 3 results) No results for input(s): PROBNP in the last 8760 hours. CBG: No results for input(s):  GLUCAP in the last 168 hours.  Recent Results (from the past 240 hour(s))  MRSA PCR Screening     Status: None   Collection Time: 03/04/14  5:08 PM  Result Value Ref Range Status   MRSA by PCR NEGATIVE NEGATIVE Final    Comment:        The GeneXpert MRSA Assay (FDA approved for NASAL specimens only), is one component of a comprehensive MRSA colonization surveillance program. It is not intended to diagnose MRSA infection nor to guide or monitor treatment for MRSA infections.      Studies: Dg Chest Port 1 View  03/04/2014   CLINICAL DATA:  81 year old female with chest pain and shortness of Breath. Initial encounter.  EXAM: PORTABLE CHEST - 1 VIEW  COMPARISON:  12/30/2012 and earlier.  FINDINGS: Portable AP semi upright view at 1309 hrs. Cardiac event recorder re- identified. Mildly lower lung volumes. Stable cardiac size and mediastinal contours. Allowing for portable technique, the lungs are clear. No pneumothorax. Calcified atherosclerosis of the aorta.  IMPRESSION: No acute cardiopulmonary abnormality.   Electronically Signed   By: Lars Pinks M.D.   On: 03/04/2014 13:31    Scheduled Meds: . antiseptic oral rinse  7 mL Mouth Rinse BID  . arformoterol  15 mcg Nebulization BID  . budesonide  0.25 mg Nebulization Daily  . digoxin  0.125  mg Oral Daily  . enoxaparin (LOVENOX) injection  20 mg Subcutaneous Q24H  . ipratropium-albuterol  3 mL Nebulization Q4H  . levofloxacin  250 mg Oral QPM  . montelukast  10 mg Oral Daily  . predniSONE  60 mg Oral Q breakfast   Continuous Infusions: . DOBUTamine     Antibiotics Given (last 72 hours)    Date/Time Action Medication Dose   03/04/14 1749 Given   levofloxacin (LEVAQUIN) tablet 500 mg 500 mg      Active Problems:   COPD with bronchitis   HTN (hypertension)   COPD exacerbation   Orthostatic hypotension    Time spent: 25 min    VANN, JESSICA  [facility] Pager [phone] If 7PM-7AM, please contact night-coverage at  [email or web address], password [facility] 03/05/2014, 8:47 AM  LOS: 1 day

## 2014-03-05 NOTE — Progress Notes (Signed)
VASCULAR LAB PRELIMINARY  PRELIMINARY  PRELIMINARY  PRELIMINARY  Bilateral lower extremity venous duplex  completed.    Preliminary report:  Bilateral:  No evidence of DVT, superficial thrombosis, or Baker's Cyst.    Beryl Balz, RVT 03/05/2014, 1:06 PM

## 2014-03-05 NOTE — Progress Notes (Addendum)
Patient has an allergy to IVP dye.  Reaction includes heart stopping per patient and record.  Will not order CTA even with pre-treatment.  Do not think that V/Q would be beneficial with lung issues but could consider after stress test done.  Would be reluctant to treat with patient's confusion and low positive d dimer [name]

## 2014-03-05 NOTE — Progress Notes (Signed)
Patient's D-dimer is noted to be positive, she has a h/o DVT. While laying on image table getting the resting image, she was very anxious and states that "she was gonna die". This is before starting dobutamine stress part. After talking with Dr. Harrington Challenger, we can decided to obtain resting image only today. Obtain LE venous doppler, if negative, plan for stress image tomorrow.   Hilbert Corrigan PA Pager: [phone]

## 2014-03-05 NOTE — Progress Notes (Signed)
PT Cancellation Note  Patient Details Name: [name] MRN: [id number] DOB: 1931/07/15   Cancelled Treatment:    Reason Eval/Treat Not Completed: Patient at procedure or test/unavailable (Pt in nuclear medicine.  )Will return as able.  Thanks.   Irwin Brakeman F 03/05/2014, 11:35 AM [facility] [phone] (pager)

## 2014-03-05 NOTE — Care Management Note (Addendum)
    Page 1 of 1   03/06/2014     11:30:55 AM CARE MANAGEMENT NOTE 03/06/2014  Patient:  [name], [name]   Account Number:  [id number]  Date Initiated:  03/05/2014  Documentation initiated by:  GRAVES-BIGELOW,Wyn Nettle  Subjective/Objective Assessment:   Pt admitted for COPD and dizziness. Pt is from home alone with support of son and neighbors. Per pt son takes to MD appointment. Pt has DME cane, RW and WC.     Action/Plan:   No needs identified by CM at this time.   Anticipated DC Date:  03/06/2014   Anticipated DC Plan:  [place]  CM consult      Choice offered to / List presented to:             Status of service:  Completed, signed off Medicare Important Message given?  YES (If response is "NO", the following Medicare IM given date fields will be blank) Date Medicare IM given:  03/06/2014 Medicare IM given by:  GRAVES-BIGELOW,Keyasha Miah Date Additional Medicare IM given:   Additional Medicare IM given by:    Discharge Disposition:  HOME/SELF CARE  Per UR Regulation:  Reviewed for med. necessity/level of care/duration of stay  If discussed at [place] of Stay Meetings, dates discussed:    Comments:
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
## 2014-03-06 ENCOUNTER — Other Ambulatory Visit (HOSPITAL_COMMUNITY): Payer: Medicare Other

## 2014-03-06 ENCOUNTER — Inpatient Hospital Stay (HOSPITAL_COMMUNITY): Payer: Medicare Other

## 2014-03-06 DIAGNOSIS — R079 Chest pain, unspecified: Secondary | ICD-10-CM

## 2014-03-06 LAB — BASIC METABOLIC PANEL
ANION GAP: 7 (ref 5–15)
BUN: 14 mg/dL (ref 6–23)
CO2: 27 mmol/L (ref 19–32)
Calcium: 8.9 mg/dL (ref 8.4–10.5)
Chloride: 103 mEq/L (ref 96–112)
Creatinine, Ser: 0.7 mg/dL (ref 0.50–1.10)
GFR calc Af Amer: >90 mL/min (ref >90)
GFR, EST NON AFRICAN AMERICAN: 80 mL/min — AB (ref >90)
Glucose, Bld: 113 mg/dL — ABNORMAL HIGH (ref 70–99)
Potassium: 4 mmol/L (ref 3.5–5.1)
Sodium: 137 mmol/L (ref 135–145)

## 2014-03-06 LAB — CBC
HCT: 31.9 % — ABNORMAL LOW (ref 36.0–46.0)
Hemoglobin: 10.7 g/dL — ABNORMAL LOW (ref 12.0–15.0)
MCH: 31.8 pg (ref 26.0–34.0)
MCHC: 33.5 g/dL (ref 30.0–36.0)
MCV: 94.7 fL (ref 78.0–100.0)
PLATELETS: 168 10*3/uL (ref 150–400)
RBC: 3.37 MIL/uL — ABNORMAL LOW (ref 3.87–5.11)
RDW: 13.7 % (ref 11.5–15.5)
WBC: 4.9 10*3/uL (ref 4.0–10.5)

## 2014-03-06 MED ORDER — TECHNETIUM TC 99M SESTAMIBI GENERIC - CARDIOLITE
30.0000 | Freq: Once | INTRAVENOUS | Status: AC | PRN
  Administered 2014-03-06: 30 via INTRAVENOUS

## 2014-03-06 MED ORDER — PREDNISONE 10 MG PO TABS: ORAL_TABLET | ORAL | Status: DC

## 2014-03-06 MED ORDER — REGADENOSON 0.4 MG/5ML IV SOLN
0.4000 mg | Freq: Once | INTRAVENOUS | Status: AC
  Administered 2014-03-06: 0.4 mg via INTRAVENOUS
  Filled 2014-03-06: qty 5

## 2014-03-06 MED ORDER — SODIUM CHLORIDE 0.9 % IJ SOLN
80.0000 mg | INTRAVENOUS | Status: AC
  Administered 2014-03-06: 80 mg via INTRAVENOUS

## 2014-03-06 MED ORDER — LEVOFLOXACIN 250 MG PO TABS: 250.0000 mg | ORAL_TABLET | Freq: Every evening | ORAL | Status: DC

## 2014-03-06 MED ORDER — ARFORMOTEROL TARTRATE 15 MCG/2ML IN NEBU
15.0000 ug | INHALATION_SOLUTION | Freq: Two times a day (BID) | RESPIRATORY_TRACT | Status: DC
  Administered 2014-03-06: 15 ug via RESPIRATORY_TRACT

## 2014-03-06 MED ORDER — DOBUTAMINE INFUSION FOR EP/ECHO/NUC (1000 MCG/ML)
INTRAVENOUS | Status: AC
  Filled 2014-03-06: qty 250

## 2014-03-06 MED ORDER — REGADENOSON 0.4 MG/5ML IV SOLN
INTRAVENOUS | Status: AC
  Administered 2014-03-06: 0.4 mg via INTRAVENOUS
  Filled 2014-03-06: qty 5

## 2014-03-06 NOTE — Progress Notes (Addendum)
IMPRESSION: 1. No reversible ischemia or infarction. 2. Nonspecific septal hypokinesis. 3. Left ventricular ejection fraction 65% 4. Low-risk stress test findings*.  Asked nurse to notify the patient of the normal result. Dr. Harrington Challenger does not feel any further wu needed for PE. I have left a message on our office's scheduling voicemail requesting a follow-up appointment, and our office will call the patient with this information.  [name] Stauber PA-C

## 2014-03-06 NOTE — Progress Notes (Signed)
PROGRESS NOTE  [name] YHC:[id number] DOB: 23-Aug-1931 DOA: 03/04/2014 PCP: Antony Blackbird, MD  Assessment/Plan: MILD copd exacerbation with bronchitis: - steroids, - neb treatments.  -levaquin for bronchitis.  -O2- wears at night 2L and as needed during the day -d dimer elevated, duplex LE negative--- not able to have CTA due to her severe allergy -? If V/Q would  be  Helpful - not sure how long after a nuclear stress test can you have a v/q scan- breathing back to baseline - O2 100% on room air  Orthostatic hypotension: S/p IVF   Chest discomfort:  Mild elevation of troponins -stress test pending  Code Status: full Family Communication: patient Disposition Plan: home with son   Consultants:  cards  Procedures:      HPI/Subjective: No CP, no SOB- does have headache this AM   Objective: Filed Vitals:   03/06/14 1000  BP: 148/73  Pulse:   Temp:   Resp:     Intake/Output Summary (Last 24 hours) at 03/06/14 1024 Last data filed at 03/06/14 0745  Gross per 24 hour  Intake    150 ml  Output   1075 ml  Net   -925 ml   Filed Weights   03/04/14 1614 03/05/14 0431 03/06/14 0522  Weight: 43.591 kg (96 lb 1.6 oz) 44.634 kg (98 lb 6.4 oz) 44.362 kg (97 lb 12.8 oz)    Exam:   General:  Pleasant/cooeprative, NAD  Cardiovascular: rrr  Respiratory: moving more air today  Abdomen: +BS, soft  Musculoskeletal: no edema   Data Reviewed: Basic Metabolic Panel:  Recent Labs Lab 03/04/14 1327 03/06/14 0339  NA 135 137  K 3.7 4.0  CL 98 103  CO2 26 27  GLUCOSE 165* 113*  BUN 17 14  CREATININE 0.67 0.70  CALCIUM 9.3 8.9   Liver Function Tests: No results for input(s): AST, ALT, ALKPHOS, BILITOT, PROT, ALBUMIN in the last 168 hours. No results for input(s): LIPASE, AMYLASE in the last 168 hours. No results for input(s): AMMONIA in the last 168 hours. CBC:  Recent Labs Lab 03/04/14 1327 03/06/14 0339  WBC 3.6* 4.9  HGB 12.8 10.7*  HCT  38.5 31.9*  MCV 95.3 94.7  PLT 183 168   Cardiac Enzymes:  Recent Labs Lab 03/04/14 1808 03/04/14 2235 03/05/14 0518  TROPONINI 0.49* 0.34* 0.16*   BNP (last 3 results) No results for input(s): PROBNP in the last 8760 hours. CBG: No results for input(s): GLUCAP in the last 168 hours.  Recent Results (from the past 240 hour(s))  MRSA PCR Screening     Status: None   Collection Time: 03/04/14  5:08 PM  Result Value Ref Range Status   MRSA by PCR NEGATIVE NEGATIVE Final    Comment:        The GeneXpert MRSA Assay (FDA approved for NASAL specimens only), is one component of a comprehensive MRSA colonization surveillance program. It is not intended to diagnose MRSA infection nor to guide or monitor treatment for MRSA infections.      Studies: Dg Chest Port 1 View  03/04/2014   CLINICAL DATA:  81 year old female with chest pain and shortness of Breath. Initial encounter.  EXAM: PORTABLE CHEST - 1 VIEW  COMPARISON:  12/30/2012 and earlier.  FINDINGS: Portable AP semi upright view at 1309 hrs. Cardiac event recorder re- identified. Mildly lower lung volumes. Stable cardiac size and mediastinal contours. Allowing for portable technique, the lungs are clear. No pneumothorax. Calcified atherosclerosis of the aorta.  IMPRESSION: No acute cardiopulmonary abnormality.   Electronically Signed   By: Lars Pinks M.D.   On: 03/04/2014 13:31    Scheduled Meds: . antiseptic oral rinse  7 mL Mouth Rinse BID  . arformoterol  15 mcg Nebulization BID  . budesonide  0.25 mg Nebulization Daily  . digoxin  0.125 mg Oral Daily  . enoxaparin (LOVENOX) injection  20 mg Subcutaneous Q24H  . ipratropium-albuterol  3 mL Nebulization TID  . levofloxacin  250 mg Oral QPM  . montelukast  10 mg Oral Daily  . pravastatin  40 mg Oral q1800  . predniSONE  60 mg Oral Q breakfast  . regadenoson      . regadenoson  0.4 mg Intravenous Once   Continuous Infusions: . DOBUTamine     Antibiotics Given (last  72 hours)    Date/Time Action Medication Dose   03/04/14 1749 Given   levofloxacin (LEVAQUIN) tablet 500 mg 500 mg   03/05/14 1728 Given   levofloxacin (LEVAQUIN) tablet 250 mg 250 mg      Active Problems:   COPD with bronchitis   HTN (hypertension)   COPD exacerbation   Orthostatic hypotension    Time spent: 25 min    Samra Pesch  [facility] Pager [phone] If 7PM-7AM, please contact night-coverage at [email or web address], password [facility] 03/06/2014, 10:24 AM  LOS: 2 days

## 2014-03-06 NOTE — Progress Notes (Signed)
[facility] radiology assigned to read nuc.

## 2014-03-06 NOTE — Progress Notes (Signed)
Patient: [name] / Admit Date: 03/04/2014 / Date of Encounter: 03/06/2014, 10:09 AM   Subjective: Several somatic complaints prior to test - headache, occasional right thumb tremor, generally feeling poorly, doesn't feel like eating.  With Lexiscan developed throat pressure and dyspnea. Patient seemed very anxious before test which can often exacerbate reaction to the medication. Symptoms somewhat improved with aminophylline. EKG showed NSR, sinus tach with frequent PACs (briefly in succession, one instance of atrial bigeminy), PVCs, wandering atrial pacemaker. HR came down with reassurance that test was over.  Very emotional throughout the test even before medication.   Objective: Telemetry: NSR, occasional PACs, intermittent ventricular bigeminy, brief period of WAP  Physical Exam: Blood pressure 123/63, pulse 81, temperature 97.8 F (36.6 C), temperature source Oral, resp. rate 18, height 5\' 3"  (1.6 m), weight 97 lb 12.8 oz (44.362 kg), SpO2 96 %. General: Well developed frail appearing WF in no acute distress. Anxious about test. Head: Normocephalic, atraumatic, sclera non-icteric, no xanthomas, nares are without discharge. Neck: JVP not elevated. Lungs: Clear bilaterally to auscultation without wheezes, rales, or rhonchi. Breathing is unlabored. Heart: RRR S1 S2 occasional ectopy without murmurs, rubs, or gallops.  Abdomen: Soft, non-tender, non-distended with normoactive bowel sounds. No rebound/guarding. Extremities: No clubbing or cyanosis. No edema.  Neuro: Alert and oriented X 3. Moves all extremities spontaneously. Psych:  Responds to questions appropriately with a normal affect.   Intake/Output Summary (Last 24 hours) at 03/06/14 1009 Last data filed at 03/06/14 0745  Gross per 24 hour  Intake    150 ml  Output   1075 ml  Net   -925 ml    Inpatient Medications:  . antiseptic oral rinse  7 mL Mouth Rinse BID  . arformoterol  15 mcg Nebulization BID  . budesonide   0.25 mg Nebulization Daily  . digoxin  0.125 mg Oral Daily  . enoxaparin (LOVENOX) injection  20 mg Subcutaneous Q24H  . ipratropium-albuterol  3 mL Nebulization TID  . levofloxacin  250 mg Oral QPM  . montelukast  10 mg Oral Daily  . pravastatin  40 mg Oral q1800  . predniSONE  60 mg Oral Q breakfast   Infusions:  . DOBUTamine      Labs:  Recent Labs  03/04/14 1327 03/06/14 0339  NA 135 137  K 3.7 4.0  CL 98 103  CO2 26 27  GLUCOSE 165* 113*  BUN 17 14  CREATININE 0.67 0.70  CALCIUM 9.3 8.9   No results for input(s): AST, ALT, ALKPHOS, BILITOT, PROT, ALBUMIN in the last 72 hours.  Recent Labs  03/04/14 1327 03/06/14 0339  WBC 3.6* 4.9  HGB 12.8 10.7*  HCT 38.5 31.9*  MCV 95.3 94.7  PLT 183 168    Recent Labs  03/04/14 1808 03/04/14 2235 03/05/14 0518  TROPONINI 0.49* 0.34* 0.16*   Invalid input(s): POCBNP No results for input(s): HGBA1C in the last 72 hours.   Radiology/Studies:  Dg Chest Port 1 View  03/04/2014   CLINICAL DATA:  81 year old female with chest pain and shortness of Breath. Initial encounter.  EXAM: PORTABLE CHEST - 1 VIEW  COMPARISON:  12/30/2012 and earlier.  FINDINGS: Portable AP semi upright view at 1309 hrs. Cardiac event recorder re- identified. Mildly lower lung volumes. Stable cardiac size and mediastinal contours. Allowing for portable technique, the lungs are clear. No pneumothorax. Calcified atherosclerosis of the aorta.  IMPRESSION: No acute cardiopulmonary abnormality.   Electronically Signed   By: Lezlie Octave.D.  On: 03/04/2014 13:31     Assessment and Plan  1. CP/SOB with minimal troponin elevation - discussed nuclear stress test with Dr. Harrington Challenger. Given that patient is aerated by lung exam, normal Pox, and not wheezing, changed to [place] instead of dobutamine. Given her baseline anxiety and somatic complaints there was concern that prolonged dobutamine nuclear test would be difficult for the patient to endure. Lexiscan completed  as above. Dr. Harrington Challenger does not think further eval for PE is necessary at present time (not tachycardic, tachypnic or hypoxic). Tele during test was similar to what has been demonstrated during her whole admission so far - NSR, PACs, atrial bigeminy, PVCs, brief wandering atrial pacemaker. Regardless of nuc result I am not sure she would be a good candidate for invasive ischemic workup - she told me she would rather die than have another cardiac test. Await results. 2. COPD - on steroids. 3. HLD - cont statin. 4. H/o DVT - d-dimer mildly elevated, LE duplex negative 5. Orthostatic hypotension - negative oVSS yesterday.  Signed, Melina Copa PA-C   History and all data above reviewed.  Patient examined.  I agree with the findings as above.  The patient exam reveals COR:RRR  ,  Lungs: Clear  ,  Abd: Positive bowel sounds, no rebound no guarding, Ext No edema   .  All available labs, radiology testing, previous records reviewed. Agree with documented assessment and plan. Chest pain.  No evidence of ischemia.  No further cardiac work up.    Jeneen Rinks Angeleah Labrake  1:45 PM  03/06/2014

## 2014-03-06 NOTE — Discharge Summary (Signed)
Physician Discharge Summary  [name] VZD:[id number] DOB: Jun 17, 1931 DOA: 03/04/2014  PCP: Antony Blackbird, MD  Admit date: 03/04/2014 Discharge date: 03/07/2014  Time spent: 35 minutes  Recommendations for Outpatient Follow-up:  1. Home O2  Discharge Diagnoses:  Active Problems:   COPD with bronchitis   HTN (hypertension)   COPD exacerbation   Orthostatic hypotension   Pain in the chest   Discharge Condition: improved  Diet recommendation: cardiac  Filed Weights   03/04/14 1614 03/05/14 0431 03/06/14 0522  Weight: 43.591 kg (96 lb 1.6 oz) 44.634 kg (98 lb 6.4 oz) 44.362 kg (97 lb 12.8 oz)    History of present illness:  [name] is a 81 y.o. female with prior h/o copd on home oxygen, was brought to the ED for acute onset of SOB with associated wheezing. As per the patient, she did not take her nebulizer treatments as she was supposed to do, and she has been feeling sob over the last 4 days. Today it got worse and EMS was called. She also reported some chest discomfort to EMS.She was given some aspirin, neb treatement and SL NTG. She denies any chest pain currently. Her breathing improved and she was referred to medical service for admission for management of copd exacerbation. In ED when the patient got up , she reported feeling dizzy and she was found to be orthostatic. She was given a fluid bolus and requested admission  Hospital Course:  MILD copd exacerbation with bronchitis: - steroids, - neb treatments.  -levaquin for bronchitis.  -O2- wears at night 2L and as needed during the day -d dimer mildly elevated, duplex LE negative--- not able to have CTA due to her severe allergy -no further work up - O2 100% on room air  Orthostatic hypotension: S/p IVF   Chest discomfort:  Mild elevation of troponins -stress test low risk  Procedures:  Stress test  Consultations:  cards  Discharge Exam: Filed Vitals:   03/06/14 1039  BP: 146/81  Pulse:    Temp:   Resp:      Discharge Instructions   Discharge Instructions    Diet - low sodium heart healthy    Complete by:  As directed      Discharge instructions    Complete by:  As directed   Continue to wear O2 at night always and PRN during day     Increase activity slowly    Complete by:  As directed           Discharge Medication List as of 03/06/2014  3:32 PM    START taking these medications   Details  levofloxacin (LEVAQUIN) 250 MG tablet Take 1 tablet (250 mg total) by mouth every evening., Starting 03/06/2014, Until Discontinued, Print    predniSONE (DELTASONE) 10 MG tablet 40 mg x 3 days, 30 mg x 3 days, 20 mg x3 days, 10mg  x 3 days and then d/c, Print      CONTINUE these medications which have NOT CHANGED   Details  albuterol (PROAIR HFA) 108 (90 BASE) MCG/ACT inhaler Inhale 2 puffs into the lungs every 6 (six) hours as needed for wheezing., Starting 01/10/2012, Until Discontinued, Normal    budesonide (PULMICORT) 0.25 MG/2ML nebulizer solution Use 1 vial in neb BID.  Rinse mouth out after treatment.  Dx: 491.20, Print    digoxin (LANOXIN) 0.125 MG tablet TAKE 1 TABLET (125 MCG TOTAL) BY MOUTH DAILY., Normal    fluticasone (FLONASE) 50 MCG/ACT nasal spray Place 2  sprays into both nostrils daily., Starting 01/05/2014, Until Discontinued, Print    formoterol (PERFOROMIST) 20 MCG/2ML nebulizer solution Take 2 mLs (20 mcg total) by nebulization 2 (two) times daily. Dx 496, Starting 01/15/2013, Until Discontinued, Print    montelukast (SINGULAIR) 10 MG tablet TAKE 1 TABLET BY MOUTH ONCE DAILY, Normal    pravastatin (PRAVACHOL) 40 MG tablet Take 40 mg by mouth daily. , Until Discontinued, Historical Med    SPIRIVA HANDIHALER 18 MCG inhalation capsule INHALE 1 CAPSULE VIA HANDIHALER ONCE DAILY AT THE SAME TIME EVERY DAY, Normal    Vitamin D, Ergocalciferol, (DRISDOL) 50000 UNITS CAPS capsule Take 50,000 Units by mouth every 7 (seven) days. Take On Friday, Until  Discontinued, Historical Med      STOP taking these medications     IBUPROFEN PO      metoprolol succinate (TOPROL-XL) 25 MG 24 hr tablet      polyethylene glycol (MIRALAX / GLYCOLAX) packet        Allergies  Allergen Reactions  . Epinephrine Other (See Comments)    Patient thinks her heart stopped and the MD said never to take this again.  Clementeen Hoof [Iodinated Diagnostic Agents] Other (See Comments)    Unknown-patient said she thinks her heart stopped, but she really can't remember  . Asa [Aspirin] Other (See Comments)    Heart doctor told her to take advil instead   Follow-up Information    Follow up with Cristopher Peru, MD.   Specialty:  Cardiology   Why:  Office will call you for your followup appointment. Call office if you have not heard back in 3 days.   Contact information:   [address] [place] [address] [phone]        The results of significant diagnostics from this hospitalization (including imaging, microbiology, ancillary and laboratory) are listed below for reference.    Significant Diagnostic Studies: Nm Myocar Multi W/spect W/wall Motion / Ef  03/06/2014   CLINICAL DATA:  Chest pain  EXAM: MYOCARDIAL IMAGING WITH SPECT (REST AND PHARMACOLOGIC-STRESS)  GATED LEFT VENTRICULAR WALL MOTION STUDY  LEFT VENTRICULAR EJECTION FRACTION  TECHNIQUE: Standard myocardial SPECT imaging was performed after resting intravenous injection of 10 mCi Tc-45m sestamibi. Subsequently, intravenous infusion of Lexiscan was performed under the supervision of the Cardiology staff. At peak effect of the drug, 30 mCi Tc-6m sestamibi was injected intravenously and standard myocardial SPECT imaging was performed. Quantitative gated imaging was also performed to evaluate left ventricular wall motion, and estimate left ventricular ejection fraction.  COMPARISON:  None.  FINDINGS: Perfusion: Allowing for breast attenuation artifact, there is no stress-induced perfusion  defect or scar.  Wall Motion: Nonspecific septal hypokinesis.  Left Ventricular Ejection Fraction: 65 %  End diastolic volume 58 ml  End systolic volume 20 ml  IMPRESSION: 1. No reversible ischemia or infarction.  2. Nonspecific septal hypokinesis.  3. Left ventricular ejection fraction 65%  4. Low-risk stress test findings*.  *2012 Appropriate Use Criteria for Coronary Revascularization Focused Update: J Am Coll Cardiol. 6270;35(0):093-818. http://content.airportbarriers.com.aspx?articleid=1201161   Electronically Signed   By: Maryclare Bean M.D.   On: 03/06/2014 13:00   Dg Chest Port 1 View  03/04/2014   CLINICAL DATA:  81 year old female with chest pain and shortness of Breath. Initial encounter.  EXAM: PORTABLE CHEST - 1 VIEW  COMPARISON:  12/30/2012 and earlier.  FINDINGS: Portable AP semi upright view at 1309 hrs. Cardiac event recorder re- identified. Mildly lower lung volumes. Stable cardiac size  and mediastinal contours. Allowing for portable technique, the lungs are clear. No pneumothorax. Calcified atherosclerosis of the aorta.  IMPRESSION: No acute cardiopulmonary abnormality.   Electronically Signed   By: Lars Pinks M.D.   On: 03/04/2014 13:31    Microbiology: Recent Results (from the past 240 hour(s))  MRSA PCR Screening     Status: None   Collection Time: 03/04/14  5:08 PM  Result Value Ref Range Status   MRSA by PCR NEGATIVE NEGATIVE Final    Comment:        The GeneXpert MRSA Assay (FDA approved for NASAL specimens only), is one component of a comprehensive MRSA colonization surveillance program. It is not intended to diagnose MRSA infection nor to guide or monitor treatment for MRSA infections.      Labs: Basic Metabolic Panel:  Recent Labs Lab 03/04/14 1327 03/06/14 0339  NA 135 137  K 3.7 4.0  CL 98 103  CO2 26 27  GLUCOSE 165* 113*  BUN 17 14  CREATININE 0.67 0.70  CALCIUM 9.3 8.9   Liver Function Tests: No results for input(s): AST, ALT, ALKPHOS, BILITOT,  PROT, ALBUMIN in the last 168 hours. No results for input(s): LIPASE, AMYLASE in the last 168 hours. No results for input(s): AMMONIA in the last 168 hours. CBC:  Recent Labs Lab 03/04/14 1327 03/06/14 0339  WBC 3.6* 4.9  HGB 12.8 10.7*  HCT 38.5 31.9*  MCV 95.3 94.7  PLT 183 168   Cardiac Enzymes:  Recent Labs Lab 03/04/14 1808 03/04/14 2235 03/05/14 0518  TROPONINI 0.49* 0.34* 0.16*   BNP: BNP (last 3 results) No results for input(s): PROBNP in the last 8760 hours. CBG: No results for input(s): GLUCAP in the last 168 hours.     SignedEulogio Bear  [facility] 03/07/2014, 2:23 PM

## 2014-03-06 NOTE — Evaluation (Signed)
Physical Therapy Evaluation Patient Details Name: [name] MRN: [id number] DOB: 11/20/1931 Today's Date: 03/06/2014   History of Present Illness  Pt admit with orthostatic hypotension.    Clinical Impression  Pt admitted with above diagnosis. Pt currently with functional limitations due to the deficits listed below (see PT Problem List). Pt ambulated well with RW.  Has one at home.  Declines HHPT.  Son will be at home at night with pt.  Pt close to baseline.   Pt will benefit from skilled PT to increase their independence and safety with mobility to allow discharge to the venue listed below.      Follow Up Recommendations No PT follow up    Equipment Recommendations  None recommended by PT    Recommendations for Other Services       Precautions / Restrictions Precautions Precautions: Fall Restrictions Weight Bearing Restrictions: No      Mobility  Bed Mobility Overal bed mobility: Independent                Transfers Overall transfer level: Independent                  Ambulation/Gait Ambulation/Gait assistance: Min guard Ambulation Distance (Feet): 200 Feet Assistive device: Rolling walker (2 wheeled) Gait Pattern/deviations: Step-through pattern;Decreased stride length;Antalgic   Gait velocity interpretation: Below normal speed for age/gender General Gait Details: Pt reaching for objects to hold onto when not using RW.  Unsteady on feet.  Pt demonstrates better safety awareness with RW.  Instructed to use the RW at all times.  Pt may benefit from HHPT for a few visits however did not want it at present.  Pt states she can return to baseline on her own.    Stairs            Wheelchair Mobility    Modified Rankin (Stroke Patients Only)       Balance Overall balance assessment: Needs assistance;History of Falls         Standing balance support: Bilateral upper extremity supported;During functional activity Standing balance-Leahy  Scale: Poor Standing balance comment: Requires use of RW.               High level balance activites: Direction changes;Turns;Sudden stops High Level Balance Comments: Can perform above with RW.             Pertinent Vitals/Pain Pain Assessment: No/denies pain  Orthostatic BPs  Supine 143/74, 76 bpm  Sitting 136/80, 75 bpm  Standing 138/81, 80 bpm  Standing after 3 min 149/77, 81 bpm    No dizziness reported by pt.    Home Living Family/patient expects to be discharged to:: Private residence Living Arrangements: Alone ("son stays all night") Available Help at Discharge: Family;Neighbor;Available PRN/intermittently Type of Home: House Home Access: Stairs to enter Entrance Stairs-Rails: Right Entrance Stairs-Number of Steps: 2 Home Layout: One level Home Equipment: Cane - single point;Wheelchair - [employer] - 4 wheels      Prior Function Level of Independence: Independent with assistive device(s)         Comments: uses walker when she feels like she needs it and uses wheelchair when in community     Hand Dominance   Dominant Hand: Right    Extremity/Trunk Assessment   Upper Extremity Assessment: Defer to OT evaluation           Lower Extremity Assessment: RLE deficits/detail;LLE deficits/detail RLE Deficits / Details: grossly 3/5 LLE Deficits / Details: grossly 3/5  Cervical / Trunk  Assessment: Normal  Communication   Communication: No difficulties  Cognition Arousal/Alertness: Awake/alert Behavior During Therapy: WFL for tasks assessed/performed Overall Cognitive Status: Within Functional Limits for tasks assessed                      General Comments      Exercises        Assessment/Plan    PT Assessment Patient needs continued PT services  PT Diagnosis Generalized weakness   PT Problem List Decreased activity tolerance;Decreased balance;Decreased mobility;Decreased knowledge of use of DME;Decreased safety  awareness;Decreased knowledge of precautions;Decreased strength  PT Treatment Interventions DME instruction;Gait training;Functional mobility training;Therapeutic exercise;Therapeutic activities;Stair training;Balance training;Patient/family education   PT Goals (Current goals can be found in the Care Plan section) Acute Rehab PT Goals Patient Stated Goal: to go home PT Goal Formulation: With patient Time For Goal Achievement: 03/20/14 Potential to Achieve Goals: Good    Frequency Min 3X/week   Barriers to discharge Decreased caregiver support      Co-evaluation               End of Session Equipment Utilized During Treatment: Gait belt Activity Tolerance: Patient limited by fatigue Patient left: in bed;with call bell/phone within reach;with bed alarm set;with family/visitor present Nurse Communication: Mobility status         Time: 0721-0740 PT Time Calculation (min) (ACUTE ONLY): 19 min   Charges:   PT Evaluation $Initial PT Evaluation Tier I: 1 Procedure     PT G CodesDenice Paradise 03-23-2014, 8:23 AM [facility] [phone] (pager)

## 2014-03-30 ENCOUNTER — Other Ambulatory Visit: Payer: Self-pay | Admitting: Internal Medicine

## 2014-03-31 ENCOUNTER — Encounter: Payer: Medicare Other | Admitting: Internal Medicine

## 2014-04-02 ENCOUNTER — Encounter: Payer: Medicare Other | Admitting: Internal Medicine

## 2014-04-07 ENCOUNTER — Encounter: Payer: Self-pay | Admitting: *Deleted

## 2014-07-22 DIAGNOSIS — E785 Hyperlipidemia, unspecified: Secondary | ICD-10-CM | POA: Diagnosis not present

## 2014-07-22 DIAGNOSIS — E559 Vitamin D deficiency, unspecified: Secondary | ICD-10-CM | POA: Diagnosis not present

## 2014-07-22 DIAGNOSIS — E119 Type 2 diabetes mellitus without complications: Secondary | ICD-10-CM | POA: Diagnosis not present

## 2014-07-22 DIAGNOSIS — I10 Essential (primary) hypertension: Secondary | ICD-10-CM | POA: Diagnosis not present

## 2014-07-27 DIAGNOSIS — E780 Pure hypercholesterolemia: Secondary | ICD-10-CM | POA: Diagnosis not present

## 2014-07-27 DIAGNOSIS — E559 Vitamin D deficiency, unspecified: Secondary | ICD-10-CM | POA: Diagnosis not present

## 2014-07-27 DIAGNOSIS — I10 Essential (primary) hypertension: Secondary | ICD-10-CM | POA: Diagnosis not present

## 2014-07-27 DIAGNOSIS — E119 Type 2 diabetes mellitus without complications: Secondary | ICD-10-CM | POA: Diagnosis not present

## 2014-08-12 ENCOUNTER — Other Ambulatory Visit: Payer: Self-pay | Admitting: Internal Medicine

## 2014-08-27 ENCOUNTER — Ambulatory Visit: Payer: Medicare Other | Admitting: Internal Medicine

## 2014-09-07 ENCOUNTER — Ambulatory Visit: Payer: Medicare Other | Admitting: Internal Medicine

## 2014-09-08 ENCOUNTER — Other Ambulatory Visit: Payer: Self-pay | Admitting: Internal Medicine

## 2014-12-15 DIAGNOSIS — Z961 Presence of intraocular lens: Secondary | ICD-10-CM | POA: Diagnosis not present

## 2014-12-15 DIAGNOSIS — H2511 Age-related nuclear cataract, right eye: Secondary | ICD-10-CM | POA: Diagnosis not present

## 2014-12-16 ENCOUNTER — Telehealth: Payer: Self-pay | Admitting: Internal Medicine

## 2014-12-16 DIAGNOSIS — H3562 Retinal hemorrhage, left eye: Secondary | ICD-10-CM | POA: Diagnosis not present

## 2014-12-16 DIAGNOSIS — I10 Essential (primary) hypertension: Secondary | ICD-10-CM | POA: Diagnosis not present

## 2014-12-16 DIAGNOSIS — E785 Hyperlipidemia, unspecified: Secondary | ICD-10-CM | POA: Diagnosis not present

## 2014-12-16 DIAGNOSIS — H4312 Vitreous hemorrhage, left eye: Secondary | ICD-10-CM | POA: Diagnosis not present

## 2014-12-16 DIAGNOSIS — Z79899 Other long term (current) drug therapy: Secondary | ICD-10-CM | POA: Diagnosis not present

## 2014-12-16 DIAGNOSIS — E878 Other disorders of electrolyte and fluid balance, not elsewhere classified: Secondary | ICD-10-CM | POA: Diagnosis not present

## 2014-12-16 DIAGNOSIS — I499 Cardiac arrhythmia, unspecified: Secondary | ICD-10-CM | POA: Diagnosis not present

## 2014-12-16 DIAGNOSIS — D649 Anemia, unspecified: Secondary | ICD-10-CM | POA: Diagnosis not present

## 2014-12-16 DIAGNOSIS — I471 Supraventricular tachycardia: Secondary | ICD-10-CM | POA: Diagnosis not present

## 2014-12-16 DIAGNOSIS — R634 Abnormal weight loss: Secondary | ICD-10-CM | POA: Diagnosis not present

## 2014-12-16 DIAGNOSIS — J449 Chronic obstructive pulmonary disease, unspecified: Secondary | ICD-10-CM | POA: Diagnosis not present

## 2014-12-16 DIAGNOSIS — E119 Type 2 diabetes mellitus without complications: Secondary | ICD-10-CM | POA: Diagnosis not present

## 2014-12-16 NOTE — Telephone Encounter (Signed)
Request for surgical clearance:  1. What type of surgery is being performed? EYE SURGERY  2. When is this surgery scheduled? 12/21/14  Are there any medications that need to be held prior to surgery and how long?  ITS UNKNOWN, PT HAD NO MED LIST  3. Name of physician performing surgery? Dr. Zadie Rhine  4. What is your office phone and fax number? [phone]

## 2014-12-16 NOTE — Telephone Encounter (Signed)
New message    Son calling wants to discuss or have clarification on what medication his mother is taken or not suppose to be taken

## 2014-12-16 NOTE — Telephone Encounter (Signed)
Tried to call son again and his voice mail has not been set up yet

## 2014-12-16 NOTE — Telephone Encounter (Signed)
Spoke with son and they were currently at the eye doctor and asked that I call him back to discuss his mothers mediations

## 2014-12-16 NOTE — Telephone Encounter (Signed)
Dr Lovena Le reviewed and she may proceed with surgery.  Low risk from a cardiac standpoint.

## 2014-12-17 ENCOUNTER — Telehealth: Payer: Self-pay | Admitting: Internal Medicine

## 2014-12-17 NOTE — Telephone Encounter (Signed)
Spoke with the pt's son, Delfino Lovett He states that the pt has not been using her noct o2 b/c the tubing does not reach long enough  He is asking for [place] to fax order to [facility] in [place] for "2 packs of 25 ft tubing and connector"  I noticed she has not been seen since Jan 2016 and was a no show for July 2016 appt  I offered to make her an appt, but he refused due to pending appt for eye surgery  CDY, please advise if okay to order what he is requesting, thanks

## 2014-12-17 NOTE — Telephone Encounter (Signed)
Returned call to [employer] with Dr.Rankin's office.Advised Dr.Taylor advised may proceed with surgery.12/16/14 telephone note faxed to fax # [phone].

## 2014-12-17 NOTE — Telephone Encounter (Signed)
Ok to order longer O2 hose as he requests

## 2014-12-17 NOTE — Telephone Encounter (Signed)
Called spoke with pt son. He is on his way to a medical supply company now to get her tubing. Nothing further needed

## 2014-12-17 NOTE — Telephone Encounter (Signed)
Per device clinic staff, no request for surgery has been received or are pending. Dr. Dahlia Bailiff office contacted and informed to resend the request to this fax # [phone].

## 2014-12-17 NOTE — Telephone Encounter (Signed)
Advised scheduler will call back to schedule appointment with Dr.Taylor.

## 2014-12-17 NOTE — Telephone Encounter (Signed)
Rx was faxed to [facility] at [phone]  Spoke with Delfino Lovett and notified that this was done  I have placed fax in CDY's scan folder

## 2014-12-17 NOTE — Telephone Encounter (Signed)
follow UP    Dr. Zadie Rhine office is calling again to follow up on the request for surgery on Monday  She wants a call back this morning before 12

## 2014-12-17 NOTE — Telephone Encounter (Signed)
Returned call to patient's son Delfino Lovett.He stated mother has memory problems and she is forgetting to take her medications.He wanted a list of her medications.Medications reviewed with him.Advised she needs to see PCP and also due for appointment with Dr.Taylor.Advised scheduler will call t

## 2014-12-17 NOTE — Telephone Encounter (Signed)
Follow up  Pt son is calling he wants to know information on his mother and her medical history  He states that he is taking care of her but dont know what medication she is suppose to have

## 2014-12-21 DIAGNOSIS — H4312 Vitreous hemorrhage, left eye: Secondary | ICD-10-CM | POA: Diagnosis not present

## 2014-12-21 DIAGNOSIS — H3589 Other specified retinal disorders: Secondary | ICD-10-CM | POA: Diagnosis not present

## 2014-12-22 DIAGNOSIS — Z09 Encounter for follow-up examination after completed treatment for conditions other than malignant neoplasm: Secondary | ICD-10-CM | POA: Diagnosis not present

## 2014-12-22 DIAGNOSIS — H35012 Changes in retinal vascular appearance, left eye: Secondary | ICD-10-CM | POA: Diagnosis not present

## 2014-12-22 DIAGNOSIS — H4312 Vitreous hemorrhage, left eye: Secondary | ICD-10-CM | POA: Diagnosis not present

## 2014-12-28 ENCOUNTER — Telehealth: Payer: Self-pay | Admitting: Internal Medicine

## 2014-12-28 MED ORDER — PRAVASTATIN SODIUM 40 MG PO TABS: 40.0000 mg | ORAL_TABLET | Freq: Every day | ORAL | Status: DC

## 2014-12-28 MED ORDER — DIGOXIN 125 MCG PO TABS: ORAL_TABLET | ORAL | Status: DC

## 2014-12-28 MED ORDER — ALBUTEROL SULFATE HFA 108 (90 BASE) MCG/ACT IN AERS: 2.0000 | INHALATION_SPRAY | Freq: Four times a day (QID) | RESPIRATORY_TRACT | Status: DC | PRN

## 2014-12-28 NOTE — Telephone Encounter (Signed)
Spoke with son and let him know then names of the 2 cardiac medications she is taking.  They have been refilled to [facility]

## 2014-12-28 NOTE — Telephone Encounter (Signed)
Called and spoke to pt's son, Delfino Lovett. Delfino Lovett is questioning what the pt should be taking. Informed Richard the medication list we have has not been updated by [place] since the last OV, which was in 02/2014. Richard verbalized understanding. Read aloud the pt's medication list. Delfino Lovett verbalized understanding and stated the pt is out of her Ventolin. Rx sent to preferred pharmacy. Informed Richard pt is also due for an appt. Richard stated he was driving and will call back to make an appt when is off the road. Nothing further needed at this time.

## 2014-12-28 NOTE — Telephone Encounter (Signed)
New Message     Pt's son calling stating that pt has started having some memory problems and they have found out that pt has stopped taking her medication. Pt's son states he is trying to get the names of all of the medications that she is on that Dr. Lovena Le prescribed and wants to get [facility] on [address] for these medications so he can oversee her medications. Please call back and advise.

## 2014-12-29 ENCOUNTER — Encounter: Payer: Self-pay | Admitting: *Deleted

## 2015-01-01 ENCOUNTER — Encounter: Payer: Self-pay | Admitting: Internal Medicine

## 2015-01-01 ENCOUNTER — Ambulatory Visit (INDEPENDENT_AMBULATORY_CARE_PROVIDER_SITE_OTHER): Payer: Medicare Other | Admitting: Internal Medicine

## 2015-01-01 ENCOUNTER — Other Ambulatory Visit: Payer: Self-pay

## 2015-01-01 VITALS — BP 132/76 | HR 82 | Ht 63.0 in | Wt 96.2 lb

## 2015-01-01 DIAGNOSIS — I10 Essential (primary) hypertension: Secondary | ICD-10-CM

## 2015-01-01 DIAGNOSIS — I480 Paroxysmal atrial fibrillation: Secondary | ICD-10-CM | POA: Diagnosis not present

## 2015-01-01 DIAGNOSIS — R55 Syncope and collapse: Secondary | ICD-10-CM | POA: Diagnosis not present

## 2015-01-01 NOTE — Assessment & Plan Note (Signed)
She has been maintaining NSR. She is not a candidate for anti-coagulation due to frequent falls.

## 2015-01-01 NOTE — Assessment & Plan Note (Signed)
She is off of all her meds. She is encouraged to eat more calories.
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
## 2015-01-01 NOTE — Assessment & Plan Note (Signed)
She has had no frank syncope. Her falls are not associated with passing out. Will follow.

## 2015-01-01 NOTE — Patient Instructions (Signed)

## 2015-03-18 ENCOUNTER — Telehealth: Payer: Self-pay | Admitting: Internal Medicine

## 2015-03-18 NOTE — Telephone Encounter (Signed)
Pt's son called to find out what medications pt is to be taking now. Pt was seen in Dr. Tanna Furry office on 01/01/15. Dr. Lovena Le notes states that pt is off "she is off all her med's HTN" " she has maintained NSR she is not a candidate for Anti-coagulants due to falls. I went over pt's medication list with pt's son which  are only Pulmonary med's. Pt's son would like to know if pt is to continue to take Pravastatin 40 mg once daily. This medication "pravastatin" is not in the list of medication pt supposed to be taken.

## 2015-03-18 NOTE — Telephone Encounter (Signed)
Attempted to call [name]. No answer, voicemail is full. Will try back.

## 2015-03-18 NOTE — Telephone Encounter (Signed)
New problem    Pt's son need to know what medications should his mom be taking. Please advise

## 2015-03-19 NOTE — Telephone Encounter (Signed)
Called pt and went straight to VM, VM also full. WCB

## 2015-03-22 NOTE — Telephone Encounter (Signed)
atc Richard X3, vm full.  Wcb.

## 2015-03-23 MED ORDER — TIOTROPIUM BROMIDE MONOHYDRATE 18 MCG IN CAPS: ORAL_CAPSULE | RESPIRATORY_TRACT | Status: DC

## 2015-03-23 MED ORDER — MONTELUKAST SODIUM 10 MG PO TABS: 10.0000 mg | ORAL_TABLET | Freq: Every day | ORAL | Status: DC

## 2015-03-23 NOTE — Telephone Encounter (Signed)
Spoke with Richard. States that the pt needs refills on Spiriva and Singulair. These have been sent in to last until her appointment in May. Nothing further was needed at this time.

## 2015-03-24 DIAGNOSIS — Z79899 Other long term (current) drug therapy: Secondary | ICD-10-CM | POA: Diagnosis not present

## 2015-03-24 DIAGNOSIS — E119 Type 2 diabetes mellitus without complications: Secondary | ICD-10-CM | POA: Diagnosis not present

## 2015-03-24 DIAGNOSIS — D709 Neutropenia, unspecified: Secondary | ICD-10-CM | POA: Diagnosis not present

## 2015-03-24 DIAGNOSIS — D72819 Decreased white blood cell count, unspecified: Secondary | ICD-10-CM | POA: Diagnosis not present

## 2015-03-24 DIAGNOSIS — R079 Chest pain, unspecified: Secondary | ICD-10-CM | POA: Diagnosis not present

## 2015-03-24 DIAGNOSIS — I10 Essential (primary) hypertension: Secondary | ICD-10-CM | POA: Diagnosis not present

## 2015-03-26 NOTE — Telephone Encounter (Signed)
Tried to call son back to let him know that Dr Lovena Le did not feel like this medication was necessary at this point.  Voicemail is full

## 2015-03-30 ENCOUNTER — Telehealth: Payer: Self-pay | Admitting: Internal Medicine

## 2015-03-30 NOTE — Telephone Encounter (Signed)
LM for [name] x 1

## 2015-03-31 NOTE — Telephone Encounter (Signed)
lmtcb x2 for pt's son, Delfino Lovett.

## 2015-04-01 NOTE — Telephone Encounter (Signed)
LMTCB x 3 

## 2015-04-02 NOTE — Telephone Encounter (Signed)
lmtcb X4 for pt's son [name].  Will close message per triage protocol.

## 2015-04-06 NOTE — Telephone Encounter (Signed)
Tried to call son back again but his voicemail is full

## 2015-05-03 ENCOUNTER — Emergency Department (INDEPENDENT_AMBULATORY_CARE_PROVIDER_SITE_OTHER)
Admission: EM | Admit: 2015-05-03 | Discharge: 2015-05-03 | Payer: Medicare Other | Source: Home / Self Care | Attending: Family Medicine | Admitting: Family Medicine

## 2015-05-03 ENCOUNTER — Encounter (HOSPITAL_COMMUNITY): Payer: Self-pay | Admitting: Emergency Medicine

## 2015-05-03 ENCOUNTER — Encounter (HOSPITAL_COMMUNITY): Payer: Self-pay | Admitting: *Deleted

## 2015-05-03 DIAGNOSIS — E119 Type 2 diabetes mellitus without complications: Secondary | ICD-10-CM | POA: Diagnosis not present

## 2015-05-03 DIAGNOSIS — R1084 Generalized abdominal pain: Secondary | ICD-10-CM | POA: Diagnosis not present

## 2015-05-03 DIAGNOSIS — K429 Umbilical hernia without obstruction or gangrene: Secondary | ICD-10-CM | POA: Diagnosis not present

## 2015-05-03 DIAGNOSIS — F419 Anxiety disorder, unspecified: Secondary | ICD-10-CM | POA: Diagnosis not present

## 2015-05-03 DIAGNOSIS — R10817 Generalized abdominal tenderness: Secondary | ICD-10-CM

## 2015-05-03 DIAGNOSIS — Z862 Personal history of diseases of the blood and blood-forming organs and certain disorders involving the immune mechanism: Secondary | ICD-10-CM | POA: Insufficient documentation

## 2015-05-03 DIAGNOSIS — E7800 Pure hypercholesterolemia, unspecified: Secondary | ICD-10-CM | POA: Diagnosis not present

## 2015-05-03 DIAGNOSIS — Z85828 Personal history of other malignant neoplasm of skin: Secondary | ICD-10-CM | POA: Diagnosis not present

## 2015-05-03 DIAGNOSIS — J449 Chronic obstructive pulmonary disease, unspecified: Secondary | ICD-10-CM | POA: Insufficient documentation

## 2015-05-03 DIAGNOSIS — I10 Essential (primary) hypertension: Secondary | ICD-10-CM | POA: Diagnosis not present

## 2015-05-03 DIAGNOSIS — Z86718 Personal history of other venous thrombosis and embolism: Secondary | ICD-10-CM | POA: Diagnosis not present

## 2015-05-03 DIAGNOSIS — Z79899 Other long term (current) drug therapy: Secondary | ICD-10-CM | POA: Diagnosis not present

## 2015-05-03 DIAGNOSIS — K439 Ventral hernia without obstruction or gangrene: Secondary | ICD-10-CM | POA: Diagnosis not present

## 2015-05-03 DIAGNOSIS — R109 Unspecified abdominal pain: Secondary | ICD-10-CM | POA: Diagnosis not present

## 2015-05-03 LAB — CBC
HEMATOCRIT: 39.9 % (ref 36.0–46.0)
Hemoglobin: 13 g/dL (ref 12.0–15.0)
MCH: 31 pg (ref 26.0–34.0)
MCHC: 32.6 g/dL (ref 30.0–36.0)
MCV: 95 fL (ref 78.0–100.0)
Platelets: 190 10*3/uL (ref 150–400)
RBC: 4.2 MIL/uL (ref 3.87–5.11)
RDW: 13.5 % (ref 11.5–15.5)
WBC: 5.3 10*3/uL (ref 4.0–10.5)

## 2015-05-03 LAB — COMPREHENSIVE METABOLIC PANEL
ALT: 18 U/L (ref 14–54)
AST: 25 U/L (ref 15–41)
Albumin: 3.5 g/dL (ref 3.5–5.0)
Alkaline Phosphatase: 50 U/L (ref 38–126)
Anion gap: 12 (ref 5–15)
BUN: 12 mg/dL (ref 6–20)
CO2: 26 mmol/L (ref 22–32)
CREATININE: 0.65 mg/dL (ref 0.44–1.00)
Calcium: 8.9 mg/dL (ref 8.9–10.3)
Chloride: 101 mmol/L (ref 101–111)
Glucose, Bld: 169 mg/dL — ABNORMAL HIGH (ref 65–99)
POTASSIUM: 4.2 mmol/L (ref 3.5–5.1)
Sodium: 139 mmol/L (ref 135–145)
Total Bilirubin: 0.4 mg/dL (ref 0.3–1.2)
Total Protein: 6 g/dL — ABNORMAL LOW (ref 6.5–8.1)

## 2015-05-03 LAB — URINALYSIS, ROUTINE W REFLEX MICROSCOPIC
GLUCOSE, UA: NEGATIVE mg/dL
HGB URINE DIPSTICK: NEGATIVE
Ketones, ur: NEGATIVE mg/dL
Nitrite: NEGATIVE
Protein, ur: NEGATIVE mg/dL
Specific Gravity, Urine: 1.02 (ref 1.005–1.030)
pH: 6 (ref 5.0–8.0)

## 2015-05-03 LAB — URINE MICROSCOPIC-ADD ON

## 2015-05-03 LAB — LIPASE, BLOOD: Lipase: 61 U/L — ABNORMAL HIGH (ref 11–51)
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
## 2015-05-03 NOTE — ED Notes (Signed)
Pt reassessed by RN per request by son- pt sts abdomen is not currently hurting her. VSS. Pt and family updated on wait time and ER flow. Urine specimen collected at this time. nad noted.

## 2015-05-03 NOTE — ED Notes (Signed)
Pt c/o abd pain x 1 month. Int emesis. Pt went to Urgent Care and was sent to the ED for Abd tenderness and hernia.

## 2015-05-03 NOTE — ED Notes (Signed)
Pt here with c/o severe constant abdominal pains that started 3 days ago Unable to eat or keep fluids down without n/v x3 episodes of vomiting reported today Large hernia present  Afebrile

## 2015-05-04 ENCOUNTER — Emergency Department (HOSPITAL_COMMUNITY)
Admission: EM | Admit: 2015-05-04 | Discharge: 2015-05-04 | Disposition: A | Discharge status: Home / Self Care | Payer: Medicare Other | Attending: Emergency Medicine | Admitting: Emergency Medicine

## 2015-05-04 ENCOUNTER — Encounter (HOSPITAL_COMMUNITY): Payer: Self-pay | Admitting: Radiology

## 2015-05-04 ENCOUNTER — Emergency Department (HOSPITAL_COMMUNITY): Payer: Medicare Other

## 2015-05-04 DIAGNOSIS — R109 Unspecified abdominal pain: Secondary | ICD-10-CM | POA: Diagnosis not present

## 2015-05-04 DIAGNOSIS — K439 Ventral hernia without obstruction or gangrene: Secondary | ICD-10-CM | POA: Diagnosis not present

## 2015-05-04 MED ORDER — HYDROCODONE-ACETAMINOPHEN 5-325 MG PO TABS: 1.0000 | ORAL_TABLET | Freq: Four times a day (QID) | ORAL | Status: DC | PRN

## 2015-05-04 MED ORDER — MORPHINE SULFATE (PF) 4 MG/ML IV SOLN
4.0000 mg | Freq: Once | INTRAVENOUS | Status: AC
  Administered 2015-05-04: 4 mg via INTRAVENOUS
  Filled 2015-05-04: qty 1

## 2015-05-04 MED ORDER — BARIUM SULFATE 2.1 % PO SUSP
450.0000 mL | ORAL | Status: AC
  Administered 2015-05-04 (×2): 450 mL via ORAL

## 2015-05-04 MED ORDER — BARIUM SULFATE 2.1 % PO SUSP
ORAL | Status: AC
  Filled 2015-05-04: qty 2

## 2015-05-04 MED ORDER — ONDANSETRON HCL 4 MG/2ML IJ SOLN
4.0000 mg | Freq: Once | INTRAMUSCULAR | Status: AC
  Administered 2015-05-04: 4 mg via INTRAVENOUS
  Filled 2015-05-04: qty 2

## 2015-05-04 NOTE — ED Notes (Signed)
Patient transported to CT 

## 2015-05-04 NOTE — ED Notes (Signed)
Sleeping waiting on transport. Patient remains confused on why she is here and thinks she was involved in [facility]

## 2015-05-04 NOTE — Discharge Instructions (Signed)
You were seen today for abdominal pain. Your CT scan is reassuring. You do have evidence of a hernia. This may be popping in and out causing you some discomfort. It is reducible at this time. If it comes out and becomes more painful or gets red or inflamed you need to be reevaluated immediately. Follow-up with surgery regarding elective repair.  Hernia, Adult A hernia is the bulging of an organ or tissue through a weak spot in the muscles of the abdomen (abdominal wall). Hernias develop most often near the navel or groin. There are many kinds of hernias. Common kinds include:  Femoral hernia. This kind of hernia develops under the groin in the upper thigh area.  Inguinal hernia. This kind of hernia develops in the groin or scrotum.  Umbilical hernia. This kind of hernia develops near the navel.  Hiatal hernia. This kind of hernia causes part of the stomach to be pushed up into the chest.  Incisional hernia. This kind of hernia bulges through a scar from an abdominal surgery. CAUSES This condition may be caused by:  Heavy lifting.  Coughing over a long period of time.  Straining to have a bowel movement.  An incision made during an abdominal surgery.  A birth defect (congenital defect).  Excess weight or obesity.  Smoking.  Poor nutrition.  Cystic fibrosis.  Excess fluid in the abdomen.  Undescended testicles. SYMPTOMS Symptoms of a hernia include:  A lump on the abdomen. This is the first sign of a hernia. The lump may become more obvious with standing, straining, or coughing. It may get bigger over time if it is not treated or if the condition causing it is not treated.  Pain. A hernia is usually painless, but it may become painful over time if treatment is delayed. The pain is usually dull and may get worse with standing or lifting heavy objects. Sometimes a hernia gets tightly squeezed in the weak spot (strangulated) or stuck there (incarcerated) and causes additional  symptoms. These symptoms may include:  Vomiting.  Nausea.  Constipation.  Irritability. DIAGNOSIS A hernia may be diagnosed with:  A physical exam. During the exam your health care provider may ask you to cough or to make a specific movement, because a hernia is usually more visible when you move.  Imaging tests. These can include:  X-rays.  Ultrasound.  CT scan. TREATMENT A hernia that is small and painless may not need to be treated. A hernia that is large or painful may be treated with surgery. Inguinal hernias may be treated with surgery to prevent incarceration or strangulation. Strangulated hernias are always treated with surgery, because lack of blood to the trapped organ or tissue can cause it to die. Surgery to treat a hernia involves pushing the bulge back into place and repairing the weak part of the abdomen. HOME CARE INSTRUCTIONS  Avoid straining.  Do not lift anything heavier than 10 lb (4.5 kg).  Lift with your leg muscles, not your back muscles. This helps avoid strain.  When coughing, try to cough gently.  Prevent constipation. Constipation leads to straining with bowel movements, which can make a hernia worse or cause a hernia repair to break down. You can prevent constipation by:  Eating a high-fiber diet that includes plenty of fruits and vegetables.  Drinking enough fluids to keep your urine clear or pale yellow. Aim to drink 6-8 glasses of water per day.  Using a stool softener as directed by your health care provider.  Lose weight, if you are overweight.  Do not use any tobacco products, including cigarettes, chewing tobacco, or electronic cigarettes. If you need help quitting, ask your health care provider.  Keep all follow-up visits as directed by your health care provider. This is important. Your health care provider may need to monitor your condition. SEEK MEDICAL CARE IF:  You have swelling, redness, and pain in the affected area.  Your  bowel habits change. SEEK IMMEDIATE MEDICAL CARE IF:  You have a fever.  You have abdominal pain that is getting worse.  You feel nauseous or you vomit.  You cannot push the hernia back in place by gently pressing on it while you are lying down.  The hernia:  Changes in shape or size.  Is stuck outside the abdomen.  Becomes discolored.  Feels hard or tender.   This information is not intended to replace advice given to you by your health care provider. Make sure you discuss any questions you have with your health care provider.   Document Released: 01/30/2005 Document Revised: 02/20/2014 Document Reviewed: 12/10/2013 Elsevier Interactive Patient Education [employer].

## 2015-05-04 NOTE — ED Notes (Signed)
Son in route to pick up patient

## 2015-05-04 NOTE — ED Notes (Signed)
Patient requested coke to drink, alert to person on assessment. Denies pain currently
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
## 2015-05-04 NOTE — ED Notes (Signed)
Pt completed oral contrast

## 2015-05-04 NOTE — ED Notes (Signed)
Spoke with patients son Delfino Lovett and he is coming to pick-up patient

## 2015-06-14 ENCOUNTER — Telehealth: Payer: Self-pay | Admitting: Internal Medicine

## 2015-06-14 DIAGNOSIS — R0602 Shortness of breath: Secondary | ICD-10-CM

## 2015-06-14 MED ORDER — FORMOTEROL FUMARATE 20 MCG/2ML IN NEBU: 20.0000 ug | INHALATION_SOLUTION | Freq: Two times a day (BID) | RESPIRATORY_TRACT | Status: DC | PRN

## 2015-06-14 MED ORDER — BUDESONIDE 0.25 MG/2ML IN SUSP
0.2500 mg | Freq: Two times a day (BID) | RESPIRATORY_TRACT | Status: DC | PRN
Start: 2015-06-14 — End: 2017-12-11

## 2015-06-14 MED ORDER — ALBUTEROL SULFATE HFA 108 (90 BASE) MCG/ACT IN AERS
2.0000 | INHALATION_SPRAY | Freq: Four times a day (QID) | RESPIRATORY_TRACT | Status: DC | PRN
Start: 2015-06-14 — End: 2017-12-11

## 2015-06-14 NOTE — Telephone Encounter (Signed)
Ok to refill all as requested, thanks

## 2015-06-14 NOTE — Telephone Encounter (Signed)
Spoke with pt's son, states that pt needs refills on budesonide and perforomist to [facility] [address].   Pt also needing refill on nebulizer supplies, uses [facility].   Also states pt needs new tubing for 02-pt's dog has chewed through all current tubing for oxygen.  Last ov:02/23/14 Next ov: 06/17/15  CY, ok to place orders?  Thanks!   Allergies  Allergen Reactions  . Epinephrine Other (See Comments)    Patient thinks her heart stopped and the MD said never to take this again.  Clementeen Hoof [Iodinated Diagnostic Agents] Other (See Comments)    Unknown-patient said she thinks her heart stopped, but she really can't remember  . Asa [Aspirin] Other (See Comments)    Heart doctor told her to take advil instead   Current Outpatient Prescriptions on File Prior to Visit  Medication Sig Dispense Refill  . albuterol (PROAIR HFA) 108 (90 BASE) MCG/ACT inhaler Inhale 2 puffs into the lungs every 6 (six) hours as needed for wheezing. (Patient not taking: Reported on 01/01/2015) 1 Inhaler 4  . budesonide (PULMICORT) 0.25 MG/2ML nebulizer solution Take 0.25 mg by nebulization 2 (two) times daily as needed (shortness of breath wheezing).    . fluticasone (FLONASE) 50 MCG/ACT nasal spray Place 1 spray into both nostrils daily as needed for allergies or rhinitis.    . formoterol (PERFOROMIST) 20 MCG/2ML nebulizer solution Take 20 mcg by nebulization 2 (two) times daily as needed (shortness of breath or wheezing).    Marland Kitchen HYDROcodone-acetaminophen (NORCO/VICODIN) 5-325 MG tablet Take 1 tablet by mouth every 6 (six) hours as needed. 10 tablet 0  . montelukast (SINGULAIR) 10 MG tablet Take 1 tablet (10 mg total) by mouth daily. 30 tablet 2  . tiotropium (SPIRIVA HANDIHALER) 18 MCG inhalation capsule INHALE 1 CAPSULE VIA HANDIHALER ONCE DAILY AT THE SAME TIME EVERY DAY 30 capsule 2  . Vitamin D, Ergocalciferol, (DRISDOL) 50000 UNITS CAPS capsule Take 50,000 Units by mouth every 7 (seven) days. Take On Friday     No current  facility-administered medications on file prior to visit.

## 2015-06-14 NOTE — Telephone Encounter (Signed)
Called spoke with pt's son. I informed him of CY's recs. He request a refill on her albuterol inhaler. [facility]. He voiced understanding and had no further questions. Order placed and prescriptions sent. Nothing further needed.

## 2015-06-17 ENCOUNTER — Encounter: Payer: Self-pay | Admitting: Internal Medicine

## 2015-06-17 ENCOUNTER — Ambulatory Visit (INDEPENDENT_AMBULATORY_CARE_PROVIDER_SITE_OTHER): Payer: Medicare Other | Admitting: Internal Medicine

## 2015-06-17 VITALS — BP 140/82 | HR 79 | Ht 62.0 in | Wt 96.2 lb

## 2015-06-17 DIAGNOSIS — I4891 Unspecified atrial fibrillation: Secondary | ICD-10-CM

## 2015-06-17 DIAGNOSIS — J449 Chronic obstructive pulmonary disease, unspecified: Secondary | ICD-10-CM | POA: Diagnosis not present

## 2015-06-17 DIAGNOSIS — R0602 Shortness of breath: Secondary | ICD-10-CM
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
## 2015-06-17 NOTE — Assessment & Plan Note (Signed)
Heart rhythm feels pretty regular at this visit

## 2015-06-17 NOTE — Patient Instructions (Signed)
Order- walk test on room air      Dx COPD mixed type  Suggest you see your primary doctor for a general check up  Suggest artifical tears like Genteal or Systane

## 2015-06-17 NOTE — Assessment & Plan Note (Signed)
Not sure about the cardiac component of exertional dyspnea but I think the biggest problem is simple deconditioning. She is very thin and gets no exercise.

## 2015-06-17 NOTE — Assessment & Plan Note (Signed)
Given her medication cost and insurance concerns, simple use of a nebulizer with albuterol as needed is probably her best option now. She is not actively wheezing and I don't think she needs more aggressive medication. She has not been using oxygen and is not desaturating at rest or with exertion at this visit. Plan-DC O2

## 2015-08-11 ENCOUNTER — Telehealth: Payer: Self-pay | Admitting: Internal Medicine

## 2015-08-11 NOTE — Telephone Encounter (Signed)
ATC, VM full. WCB

## 2015-08-12 NOTE — Telephone Encounter (Signed)
Patient son [name] called back and is requesting a call back or an email. He works third shift and goes to bed about 12 -1pm . prm

## 2015-08-12 NOTE — Telephone Encounter (Signed)
Spoke with pt's son and advised that we do not know about medication pricing. He states that pt no longer has rx coverage and they are paying OOP for rx's. Advised him that he can contact several pharmacies to ask for pricing info and we can send rx's to whichever pharmacy he chooses. He will call us back if he would like to change pharmacies. Nothing further needed at this time.

## 2015-08-12 NOTE — Telephone Encounter (Signed)
Pt returning call -prm

## 2015-08-12 NOTE — Telephone Encounter (Signed)
ATC, n/a and vm is full. Unable to leave message.  PLEASE ADVISE that we cannot email patients or family members unless they send an email via [place] that we can reply to. We are not allowed to use [facility] email to communicate with pt's/family members or to discuss [place] (PHI). Thanks!

## 2015-08-12 NOTE — Telephone Encounter (Signed)
Called spoke with pt's son Delfino Lovett. He states he is at a doctor's appointment and was unable to talk at this time. He states he would return our call in the next 30 minutes.

## 2015-10-21 ENCOUNTER — Other Ambulatory Visit: Payer: Self-pay

## 2015-11-10 DIAGNOSIS — F0391 Unspecified dementia with behavioral disturbance: Secondary | ICD-10-CM | POA: Diagnosis not present

## 2015-11-18 ENCOUNTER — Other Ambulatory Visit: Payer: Self-pay | Admitting: Family Medicine

## 2015-11-18 DIAGNOSIS — F0391 Unspecified dementia with behavioral disturbance: Secondary | ICD-10-CM

## 2015-11-22 ENCOUNTER — Other Ambulatory Visit: Payer: PRIVATE HEALTH INSURANCE

## 2015-11-24 ENCOUNTER — Other Ambulatory Visit: Payer: PRIVATE HEALTH INSURANCE

## 2015-11-24 ENCOUNTER — Inpatient Hospital Stay: Admission: RE | Admit: 2015-11-24 | Payer: PRIVATE HEALTH INSURANCE | Source: Ambulatory Visit

## 2015-11-27 ENCOUNTER — Other Ambulatory Visit: Payer: Self-pay | Admitting: Family Medicine

## 2015-11-27 DIAGNOSIS — F0391 Unspecified dementia with behavioral disturbance: Secondary | ICD-10-CM

## 2015-12-09 ENCOUNTER — Other Ambulatory Visit: Payer: Self-pay | Admitting: Internal Medicine

## 2015-12-09 DIAGNOSIS — J449 Chronic obstructive pulmonary disease, unspecified: Secondary | ICD-10-CM

## 2016-01-19 ENCOUNTER — Telehealth: Payer: Self-pay | Admitting: Internal Medicine

## 2016-01-19 NOTE — Telephone Encounter (Signed)
Spoke with pt's son. He is aware that we do not have a very current list of medications. I did review with him what we do have. Message will be closed.

## 2016-01-19 NOTE — Telephone Encounter (Signed)
New Message-    Please call her son asap. He needs to know about pt's medicine.He have to sign her up for insurance by tomorrow.He needs to know what medicine she is on and what medicine she should be on please.

## 2016-01-19 NOTE — Telephone Encounter (Signed)
Returned call to pt's son, [name]. DPR not on file. Informed I would have to get verbal permission to speak with him. Spoke with pt. Pt gave verbal authorization to speak with son. Informed pt needs to fill out DPR at next visit. Pt's son requested to review medications on file (for insurance purposes). Reviewed medications on file from last visit on 01/01/15. Informed to contact PCP for most current medications. Reminded pt's f/u appt is past due. Informed to please schedule appt. Pt's son verbalized understanding.

## 2016-01-19 NOTE — Telephone Encounter (Signed)
Tried to return call but no vm is set up

## 2016-06-09 ENCOUNTER — Other Ambulatory Visit: Payer: Self-pay | Admitting: Family Medicine

## 2016-06-09 DIAGNOSIS — F0391 Unspecified dementia with behavioral disturbance: Secondary | ICD-10-CM

## 2016-06-16 ENCOUNTER — Ambulatory Visit
Admission: RE | Admit: 2016-06-16 | Discharge: 2016-06-16 | Disposition: A | Discharge status: Home / Self Care | Payer: Medicare Other | Source: Ambulatory Visit | Attending: Family Medicine | Admitting: Family Medicine

## 2016-06-16 DIAGNOSIS — R413 Other amnesia: Secondary | ICD-10-CM | POA: Diagnosis not present

## 2016-06-16 DIAGNOSIS — F0391 Unspecified dementia with behavioral disturbance: Secondary | ICD-10-CM

## 2016-06-27 DIAGNOSIS — R4189 Other symptoms and signs involving cognitive functions and awareness: Secondary | ICD-10-CM | POA: Diagnosis not present

## 2016-06-27 DIAGNOSIS — J324 Chronic pansinusitis: Secondary | ICD-10-CM | POA: Diagnosis not present

## 2017-01-25 DIAGNOSIS — Z23 Encounter for immunization: Secondary | ICD-10-CM | POA: Diagnosis not present

## 2017-01-25 DIAGNOSIS — Z0000 Encounter for general adult medical examination without abnormal findings: Secondary | ICD-10-CM | POA: Diagnosis not present

## 2017-01-25 DIAGNOSIS — E119 Type 2 diabetes mellitus without complications: Secondary | ICD-10-CM | POA: Diagnosis not present

## 2017-01-25 DIAGNOSIS — I499 Cardiac arrhythmia, unspecified: Secondary | ICD-10-CM | POA: Diagnosis not present

## 2017-01-25 DIAGNOSIS — E782 Mixed hyperlipidemia: Secondary | ICD-10-CM | POA: Diagnosis not present

## 2017-01-25 DIAGNOSIS — R4189 Other symptoms and signs involving cognitive functions and awareness: Secondary | ICD-10-CM | POA: Diagnosis not present

## 2017-01-25 DIAGNOSIS — I10 Essential (primary) hypertension: Secondary | ICD-10-CM | POA: Diagnosis not present

## 2017-09-27 DIAGNOSIS — G301 Alzheimer's disease with late onset: Secondary | ICD-10-CM | POA: Diagnosis not present

## 2017-09-27 DIAGNOSIS — S301XXA Contusion of abdominal wall, initial encounter: Secondary | ICD-10-CM | POA: Diagnosis not present

## 2017-09-27 DIAGNOSIS — F0281 Dementia in other diseases classified elsewhere with behavioral disturbance: Secondary | ICD-10-CM | POA: Diagnosis not present

## 2017-09-27 DIAGNOSIS — K439 Ventral hernia without obstruction or gangrene: Secondary | ICD-10-CM | POA: Diagnosis not present

## 2017-09-27 DIAGNOSIS — I10 Essential (primary) hypertension: Secondary | ICD-10-CM | POA: Diagnosis not present

## 2017-09-27 DIAGNOSIS — R630 Anorexia: Secondary | ICD-10-CM | POA: Diagnosis not present

## 2017-09-27 DIAGNOSIS — F0391 Unspecified dementia with behavioral disturbance: Secondary | ICD-10-CM | POA: Diagnosis not present

## 2017-09-27 DIAGNOSIS — E782 Mixed hyperlipidemia: Secondary | ICD-10-CM | POA: Diagnosis not present

## 2017-09-27 DIAGNOSIS — R7309 Other abnormal glucose: Secondary | ICD-10-CM | POA: Diagnosis not present

## 2017-09-27 DIAGNOSIS — E119 Type 2 diabetes mellitus without complications: Secondary | ICD-10-CM | POA: Diagnosis not present

## 2017-10-10 DIAGNOSIS — I10 Essential (primary) hypertension: Secondary | ICD-10-CM | POA: Diagnosis not present

## 2017-10-10 DIAGNOSIS — E119 Type 2 diabetes mellitus without complications: Secondary | ICD-10-CM | POA: Diagnosis not present

## 2017-10-10 DIAGNOSIS — K439 Ventral hernia without obstruction or gangrene: Secondary | ICD-10-CM | POA: Diagnosis not present

## 2017-10-10 DIAGNOSIS — S301XXS Contusion of abdominal wall, sequela: Secondary | ICD-10-CM | POA: Diagnosis not present

## 2017-10-10 DIAGNOSIS — F0281 Dementia in other diseases classified elsewhere with behavioral disturbance: Secondary | ICD-10-CM | POA: Diagnosis not present

## 2017-10-10 DIAGNOSIS — S301XXD Contusion of abdominal wall, subsequent encounter: Secondary | ICD-10-CM | POA: Diagnosis not present

## 2017-10-10 DIAGNOSIS — G301 Alzheimer's disease with late onset: Secondary | ICD-10-CM | POA: Diagnosis not present

## 2017-10-10 DIAGNOSIS — R630 Anorexia: Secondary | ICD-10-CM | POA: Diagnosis not present

## 2017-10-10 DIAGNOSIS — R627 Adult failure to thrive: Secondary | ICD-10-CM | POA: Diagnosis not present

## 2017-10-10 DIAGNOSIS — E782 Mixed hyperlipidemia: Secondary | ICD-10-CM | POA: Diagnosis not present

## 2017-10-11 DIAGNOSIS — G301 Alzheimer's disease with late onset: Secondary | ICD-10-CM | POA: Diagnosis not present

## 2017-10-11 DIAGNOSIS — E119 Type 2 diabetes mellitus without complications: Secondary | ICD-10-CM | POA: Diagnosis not present

## 2017-10-11 DIAGNOSIS — R627 Adult failure to thrive: Secondary | ICD-10-CM | POA: Diagnosis not present

## 2017-10-11 DIAGNOSIS — R630 Anorexia: Secondary | ICD-10-CM | POA: Diagnosis not present

## 2017-10-11 DIAGNOSIS — F0281 Dementia in other diseases classified elsewhere with behavioral disturbance: Secondary | ICD-10-CM | POA: Diagnosis not present

## 2017-10-11 DIAGNOSIS — S301XXD Contusion of abdominal wall, subsequent encounter: Secondary | ICD-10-CM | POA: Diagnosis not present

## 2017-10-12 DIAGNOSIS — S301XXD Contusion of abdominal wall, subsequent encounter: Secondary | ICD-10-CM | POA: Diagnosis not present

## 2017-10-12 DIAGNOSIS — E119 Type 2 diabetes mellitus without complications: Secondary | ICD-10-CM | POA: Diagnosis not present

## 2017-10-12 DIAGNOSIS — R630 Anorexia: Secondary | ICD-10-CM | POA: Diagnosis not present

## 2017-10-12 DIAGNOSIS — F0281 Dementia in other diseases classified elsewhere with behavioral disturbance: Secondary | ICD-10-CM | POA: Diagnosis not present

## 2017-10-12 DIAGNOSIS — R627 Adult failure to thrive: Secondary | ICD-10-CM | POA: Diagnosis not present

## 2017-10-12 DIAGNOSIS — G301 Alzheimer's disease with late onset: Secondary | ICD-10-CM | POA: Diagnosis not present

## 2017-10-15 DIAGNOSIS — F0281 Dementia in other diseases classified elsewhere with behavioral disturbance: Secondary | ICD-10-CM | POA: Diagnosis not present

## 2017-10-15 DIAGNOSIS — R627 Adult failure to thrive: Secondary | ICD-10-CM | POA: Diagnosis not present

## 2017-10-15 DIAGNOSIS — G301 Alzheimer's disease with late onset: Secondary | ICD-10-CM | POA: Diagnosis not present

## 2017-10-15 DIAGNOSIS — E119 Type 2 diabetes mellitus without complications: Secondary | ICD-10-CM | POA: Diagnosis not present

## 2017-10-15 DIAGNOSIS — S301XXD Contusion of abdominal wall, subsequent encounter: Secondary | ICD-10-CM | POA: Diagnosis not present

## 2017-10-15 DIAGNOSIS — R630 Anorexia: Secondary | ICD-10-CM | POA: Diagnosis not present

## 2017-10-16 DIAGNOSIS — G301 Alzheimer's disease with late onset: Secondary | ICD-10-CM | POA: Diagnosis not present

## 2017-10-16 DIAGNOSIS — E119 Type 2 diabetes mellitus without complications: Secondary | ICD-10-CM | POA: Diagnosis not present

## 2017-10-16 DIAGNOSIS — R630 Anorexia: Secondary | ICD-10-CM | POA: Diagnosis not present

## 2017-10-16 DIAGNOSIS — F0281 Dementia in other diseases classified elsewhere with behavioral disturbance: Secondary | ICD-10-CM | POA: Diagnosis not present

## 2017-10-16 DIAGNOSIS — S301XXD Contusion of abdominal wall, subsequent encounter: Secondary | ICD-10-CM | POA: Diagnosis not present

## 2017-10-16 DIAGNOSIS — R627 Adult failure to thrive: Secondary | ICD-10-CM | POA: Diagnosis not present

## 2017-10-17 DIAGNOSIS — F0281 Dementia in other diseases classified elsewhere with behavioral disturbance: Secondary | ICD-10-CM | POA: Diagnosis not present

## 2017-10-17 DIAGNOSIS — R630 Anorexia: Secondary | ICD-10-CM | POA: Diagnosis not present

## 2017-10-17 DIAGNOSIS — R627 Adult failure to thrive: Secondary | ICD-10-CM | POA: Diagnosis not present

## 2017-10-17 DIAGNOSIS — G301 Alzheimer's disease with late onset: Secondary | ICD-10-CM | POA: Diagnosis not present

## 2017-10-17 DIAGNOSIS — S301XXD Contusion of abdominal wall, subsequent encounter: Secondary | ICD-10-CM | POA: Diagnosis not present

## 2017-10-17 DIAGNOSIS — E119 Type 2 diabetes mellitus without complications: Secondary | ICD-10-CM | POA: Diagnosis not present

## 2017-10-18 DIAGNOSIS — S301XXD Contusion of abdominal wall, subsequent encounter: Secondary | ICD-10-CM | POA: Diagnosis not present

## 2017-10-18 DIAGNOSIS — E119 Type 2 diabetes mellitus without complications: Secondary | ICD-10-CM | POA: Diagnosis not present

## 2017-10-18 DIAGNOSIS — F0281 Dementia in other diseases classified elsewhere with behavioral disturbance: Secondary | ICD-10-CM | POA: Diagnosis not present

## 2017-10-18 DIAGNOSIS — R630 Anorexia: Secondary | ICD-10-CM | POA: Diagnosis not present

## 2017-10-18 DIAGNOSIS — R627 Adult failure to thrive: Secondary | ICD-10-CM | POA: Diagnosis not present

## 2017-10-18 DIAGNOSIS — G301 Alzheimer's disease with late onset: Secondary | ICD-10-CM | POA: Diagnosis not present

## 2017-10-19 DIAGNOSIS — E119 Type 2 diabetes mellitus without complications: Secondary | ICD-10-CM | POA: Diagnosis not present

## 2017-10-19 DIAGNOSIS — R630 Anorexia: Secondary | ICD-10-CM | POA: Diagnosis not present

## 2017-10-19 DIAGNOSIS — F0281 Dementia in other diseases classified elsewhere with behavioral disturbance: Secondary | ICD-10-CM | POA: Diagnosis not present

## 2017-10-19 DIAGNOSIS — S301XXD Contusion of abdominal wall, subsequent encounter: Secondary | ICD-10-CM | POA: Diagnosis not present

## 2017-10-19 DIAGNOSIS — G301 Alzheimer's disease with late onset: Secondary | ICD-10-CM | POA: Diagnosis not present

## 2017-10-19 DIAGNOSIS — R627 Adult failure to thrive: Secondary | ICD-10-CM | POA: Diagnosis not present

## 2017-10-22 DIAGNOSIS — F0281 Dementia in other diseases classified elsewhere with behavioral disturbance: Secondary | ICD-10-CM | POA: Diagnosis not present

## 2017-10-22 DIAGNOSIS — E119 Type 2 diabetes mellitus without complications: Secondary | ICD-10-CM | POA: Diagnosis not present

## 2017-10-22 DIAGNOSIS — S301XXD Contusion of abdominal wall, subsequent encounter: Secondary | ICD-10-CM | POA: Diagnosis not present

## 2017-10-22 DIAGNOSIS — R630 Anorexia: Secondary | ICD-10-CM | POA: Diagnosis not present

## 2017-10-22 DIAGNOSIS — G301 Alzheimer's disease with late onset: Secondary | ICD-10-CM | POA: Diagnosis not present

## 2017-10-22 DIAGNOSIS — R627 Adult failure to thrive: Secondary | ICD-10-CM | POA: Diagnosis not present

## 2017-10-23 DIAGNOSIS — R630 Anorexia: Secondary | ICD-10-CM | POA: Diagnosis not present

## 2017-10-23 DIAGNOSIS — S301XXD Contusion of abdominal wall, subsequent encounter: Secondary | ICD-10-CM | POA: Diagnosis not present

## 2017-10-23 DIAGNOSIS — R627 Adult failure to thrive: Secondary | ICD-10-CM | POA: Diagnosis not present

## 2017-10-23 DIAGNOSIS — F0281 Dementia in other diseases classified elsewhere with behavioral disturbance: Secondary | ICD-10-CM | POA: Diagnosis not present

## 2017-10-23 DIAGNOSIS — E119 Type 2 diabetes mellitus without complications: Secondary | ICD-10-CM | POA: Diagnosis not present

## 2017-10-23 DIAGNOSIS — G301 Alzheimer's disease with late onset: Secondary | ICD-10-CM | POA: Diagnosis not present

## 2017-10-24 DIAGNOSIS — E119 Type 2 diabetes mellitus without complications: Secondary | ICD-10-CM | POA: Diagnosis not present

## 2017-10-24 DIAGNOSIS — F0281 Dementia in other diseases classified elsewhere with behavioral disturbance: Secondary | ICD-10-CM | POA: Diagnosis not present

## 2017-10-24 DIAGNOSIS — R630 Anorexia: Secondary | ICD-10-CM | POA: Diagnosis not present

## 2017-10-24 DIAGNOSIS — G301 Alzheimer's disease with late onset: Secondary | ICD-10-CM | POA: Diagnosis not present

## 2017-10-24 DIAGNOSIS — R627 Adult failure to thrive: Secondary | ICD-10-CM | POA: Diagnosis not present

## 2017-10-24 DIAGNOSIS — S301XXD Contusion of abdominal wall, subsequent encounter: Secondary | ICD-10-CM | POA: Diagnosis not present

## 2017-10-25 DIAGNOSIS — S301XXD Contusion of abdominal wall, subsequent encounter: Secondary | ICD-10-CM | POA: Diagnosis not present

## 2017-10-25 DIAGNOSIS — R630 Anorexia: Secondary | ICD-10-CM | POA: Diagnosis not present

## 2017-10-25 DIAGNOSIS — G301 Alzheimer's disease with late onset: Secondary | ICD-10-CM | POA: Diagnosis not present

## 2017-10-25 DIAGNOSIS — E119 Type 2 diabetes mellitus without complications: Secondary | ICD-10-CM | POA: Diagnosis not present

## 2017-10-25 DIAGNOSIS — R627 Adult failure to thrive: Secondary | ICD-10-CM | POA: Diagnosis not present

## 2017-10-25 DIAGNOSIS — F0281 Dementia in other diseases classified elsewhere with behavioral disturbance: Secondary | ICD-10-CM | POA: Diagnosis not present

## 2017-10-26 DIAGNOSIS — R630 Anorexia: Secondary | ICD-10-CM | POA: Diagnosis not present

## 2017-10-26 DIAGNOSIS — F0281 Dementia in other diseases classified elsewhere with behavioral disturbance: Secondary | ICD-10-CM | POA: Diagnosis not present

## 2017-10-26 DIAGNOSIS — S301XXD Contusion of abdominal wall, subsequent encounter: Secondary | ICD-10-CM | POA: Diagnosis not present

## 2017-10-26 DIAGNOSIS — R627 Adult failure to thrive: Secondary | ICD-10-CM | POA: Diagnosis not present

## 2017-10-26 DIAGNOSIS — G301 Alzheimer's disease with late onset: Secondary | ICD-10-CM | POA: Diagnosis not present

## 2017-10-26 DIAGNOSIS — E119 Type 2 diabetes mellitus without complications: Secondary | ICD-10-CM | POA: Diagnosis not present

## 2017-10-29 DIAGNOSIS — S301XXD Contusion of abdominal wall, subsequent encounter: Secondary | ICD-10-CM | POA: Diagnosis not present

## 2017-10-29 DIAGNOSIS — R630 Anorexia: Secondary | ICD-10-CM | POA: Diagnosis not present

## 2017-10-29 DIAGNOSIS — E119 Type 2 diabetes mellitus without complications: Secondary | ICD-10-CM | POA: Diagnosis not present

## 2017-10-29 DIAGNOSIS — F0281 Dementia in other diseases classified elsewhere with behavioral disturbance: Secondary | ICD-10-CM | POA: Diagnosis not present

## 2017-10-29 DIAGNOSIS — G301 Alzheimer's disease with late onset: Secondary | ICD-10-CM | POA: Diagnosis not present

## 2017-10-29 DIAGNOSIS — R627 Adult failure to thrive: Secondary | ICD-10-CM | POA: Diagnosis not present

## 2017-10-31 DIAGNOSIS — E119 Type 2 diabetes mellitus without complications: Secondary | ICD-10-CM | POA: Diagnosis not present

## 2017-10-31 DIAGNOSIS — R627 Adult failure to thrive: Secondary | ICD-10-CM | POA: Diagnosis not present

## 2017-10-31 DIAGNOSIS — R630 Anorexia: Secondary | ICD-10-CM | POA: Diagnosis not present

## 2017-10-31 DIAGNOSIS — S301XXD Contusion of abdominal wall, subsequent encounter: Secondary | ICD-10-CM | POA: Diagnosis not present

## 2017-10-31 DIAGNOSIS — F0281 Dementia in other diseases classified elsewhere with behavioral disturbance: Secondary | ICD-10-CM | POA: Diagnosis not present

## 2017-10-31 DIAGNOSIS — G301 Alzheimer's disease with late onset: Secondary | ICD-10-CM | POA: Diagnosis not present

## 2017-11-01 DIAGNOSIS — S301XXD Contusion of abdominal wall, subsequent encounter: Secondary | ICD-10-CM | POA: Diagnosis not present

## 2017-11-01 DIAGNOSIS — R627 Adult failure to thrive: Secondary | ICD-10-CM | POA: Diagnosis not present

## 2017-11-01 DIAGNOSIS — E119 Type 2 diabetes mellitus without complications: Secondary | ICD-10-CM | POA: Diagnosis not present

## 2017-11-01 DIAGNOSIS — F0281 Dementia in other diseases classified elsewhere with behavioral disturbance: Secondary | ICD-10-CM | POA: Diagnosis not present

## 2017-11-01 DIAGNOSIS — R630 Anorexia: Secondary | ICD-10-CM | POA: Diagnosis not present

## 2017-11-01 DIAGNOSIS — G301 Alzheimer's disease with late onset: Secondary | ICD-10-CM | POA: Diagnosis not present

## 2017-11-02 DIAGNOSIS — R630 Anorexia: Secondary | ICD-10-CM | POA: Diagnosis not present

## 2017-11-02 DIAGNOSIS — E119 Type 2 diabetes mellitus without complications: Secondary | ICD-10-CM | POA: Diagnosis not present

## 2017-11-02 DIAGNOSIS — R627 Adult failure to thrive: Secondary | ICD-10-CM | POA: Diagnosis not present

## 2017-11-02 DIAGNOSIS — F0281 Dementia in other diseases classified elsewhere with behavioral disturbance: Secondary | ICD-10-CM | POA: Diagnosis not present

## 2017-11-02 DIAGNOSIS — G301 Alzheimer's disease with late onset: Secondary | ICD-10-CM | POA: Diagnosis not present

## 2017-11-02 DIAGNOSIS — S301XXD Contusion of abdominal wall, subsequent encounter: Secondary | ICD-10-CM | POA: Diagnosis not present

## 2017-11-07 DIAGNOSIS — R627 Adult failure to thrive: Secondary | ICD-10-CM | POA: Diagnosis not present

## 2017-11-07 DIAGNOSIS — F0281 Dementia in other diseases classified elsewhere with behavioral disturbance: Secondary | ICD-10-CM | POA: Diagnosis not present

## 2017-11-07 DIAGNOSIS — E119 Type 2 diabetes mellitus without complications: Secondary | ICD-10-CM | POA: Diagnosis not present

## 2017-11-07 DIAGNOSIS — R630 Anorexia: Secondary | ICD-10-CM | POA: Diagnosis not present

## 2017-11-07 DIAGNOSIS — G301 Alzheimer's disease with late onset: Secondary | ICD-10-CM | POA: Diagnosis not present

## 2017-11-07 DIAGNOSIS — S301XXD Contusion of abdominal wall, subsequent encounter: Secondary | ICD-10-CM | POA: Diagnosis not present

## 2017-11-12 DIAGNOSIS — R630 Anorexia: Secondary | ICD-10-CM | POA: Diagnosis not present

## 2017-11-12 DIAGNOSIS — R627 Adult failure to thrive: Secondary | ICD-10-CM | POA: Diagnosis not present

## 2017-11-12 DIAGNOSIS — G301 Alzheimer's disease with late onset: Secondary | ICD-10-CM | POA: Diagnosis not present

## 2017-11-12 DIAGNOSIS — E119 Type 2 diabetes mellitus without complications: Secondary | ICD-10-CM | POA: Diagnosis not present

## 2017-11-12 DIAGNOSIS — F0281 Dementia in other diseases classified elsewhere with behavioral disturbance: Secondary | ICD-10-CM | POA: Diagnosis not present

## 2017-11-12 DIAGNOSIS — S301XXD Contusion of abdominal wall, subsequent encounter: Secondary | ICD-10-CM | POA: Diagnosis not present

## 2017-12-10 ENCOUNTER — Encounter (HOSPITAL_COMMUNITY): Payer: Self-pay | Admitting: Emergency Medicine

## 2017-12-10 ENCOUNTER — Inpatient Hospital Stay (HOSPITAL_COMMUNITY)
Admission: EM | Admit: 2017-12-10 | Discharge: 2017-12-17 | DRG: 208 | Disposition: A | Discharge status: Hospice | Payer: Medicare Other | Attending: Internal Medicine | Admitting: Internal Medicine

## 2017-12-10 DIAGNOSIS — Z9981 Dependence on supplemental oxygen: Secondary | ICD-10-CM

## 2017-12-10 DIAGNOSIS — I959 Hypotension, unspecified: Secondary | ICD-10-CM | POA: Diagnosis present

## 2017-12-10 DIAGNOSIS — I4891 Unspecified atrial fibrillation: Secondary | ICD-10-CM | POA: Diagnosis not present

## 2017-12-10 DIAGNOSIS — Z7401 Bed confinement status: Secondary | ICD-10-CM

## 2017-12-10 DIAGNOSIS — E7800 Pure hypercholesterolemia, unspecified: Secondary | ICD-10-CM | POA: Diagnosis present

## 2017-12-10 DIAGNOSIS — I10 Essential (primary) hypertension: Secondary | ICD-10-CM | POA: Diagnosis not present

## 2017-12-10 DIAGNOSIS — F419 Anxiety disorder, unspecified: Secondary | ICD-10-CM | POA: Diagnosis present

## 2017-12-10 DIAGNOSIS — R4182 Altered mental status, unspecified: Secondary | ICD-10-CM | POA: Diagnosis not present

## 2017-12-10 DIAGNOSIS — J9601 Acute respiratory failure with hypoxia: Secondary | ICD-10-CM | POA: Diagnosis present

## 2017-12-10 DIAGNOSIS — I2699 Other pulmonary embolism without acute cor pulmonale: Secondary | ICD-10-CM | POA: Diagnosis not present

## 2017-12-10 DIAGNOSIS — G9341 Metabolic encephalopathy: Secondary | ICD-10-CM | POA: Diagnosis present

## 2017-12-10 DIAGNOSIS — Z90710 Acquired absence of both cervix and uterus: Secondary | ICD-10-CM

## 2017-12-10 DIAGNOSIS — Z7189 Other specified counseling: Secondary | ICD-10-CM

## 2017-12-10 DIAGNOSIS — R531 Weakness: Secondary | ICD-10-CM | POA: Diagnosis not present

## 2017-12-10 DIAGNOSIS — R410 Disorientation, unspecified: Secondary | ICD-10-CM | POA: Diagnosis not present

## 2017-12-10 DIAGNOSIS — J181 Lobar pneumonia, unspecified organism: Secondary | ICD-10-CM | POA: Diagnosis not present

## 2017-12-10 DIAGNOSIS — E785 Hyperlipidemia, unspecified: Secondary | ICD-10-CM | POA: Diagnosis present

## 2017-12-10 DIAGNOSIS — Z79899 Other long term (current) drug therapy: Secondary | ICD-10-CM

## 2017-12-10 DIAGNOSIS — I482 Chronic atrial fibrillation, unspecified: Secondary | ICD-10-CM | POA: Diagnosis present

## 2017-12-10 DIAGNOSIS — Z515 Encounter for palliative care: Secondary | ICD-10-CM | POA: Diagnosis present

## 2017-12-10 DIAGNOSIS — I82452 Acute embolism and thrombosis of left peroneal vein: Secondary | ICD-10-CM | POA: Diagnosis not present

## 2017-12-10 DIAGNOSIS — I82432 Acute embolism and thrombosis of left popliteal vein: Secondary | ICD-10-CM | POA: Diagnosis not present

## 2017-12-10 DIAGNOSIS — Z85820 Personal history of malignant melanoma of skin: Secondary | ICD-10-CM

## 2017-12-10 DIAGNOSIS — R627 Adult failure to thrive: Secondary | ICD-10-CM | POA: Diagnosis present

## 2017-12-10 DIAGNOSIS — J440 Chronic obstructive pulmonary disease with acute lower respiratory infection: Secondary | ICD-10-CM | POA: Diagnosis present

## 2017-12-10 DIAGNOSIS — E860 Dehydration: Secondary | ICD-10-CM | POA: Diagnosis present

## 2017-12-10 DIAGNOSIS — J9600 Acute respiratory failure, unspecified whether with hypoxia or hypercapnia: Secondary | ICD-10-CM

## 2017-12-10 DIAGNOSIS — R0602 Shortness of breath: Secondary | ICD-10-CM

## 2017-12-10 DIAGNOSIS — R109 Unspecified abdominal pain: Secondary | ICD-10-CM | POA: Diagnosis present

## 2017-12-10 DIAGNOSIS — I82442 Acute embolism and thrombosis of left tibial vein: Secondary | ICD-10-CM | POA: Diagnosis not present

## 2017-12-10 DIAGNOSIS — I5023 Acute on chronic systolic (congestive) heart failure: Secondary | ICD-10-CM | POA: Diagnosis not present

## 2017-12-10 DIAGNOSIS — R5383 Other fatigue: Secondary | ICD-10-CM | POA: Diagnosis not present

## 2017-12-10 DIAGNOSIS — Z66 Do not resuscitate: Secondary | ICD-10-CM | POA: Diagnosis present

## 2017-12-10 DIAGNOSIS — G9340 Encephalopathy, unspecified: Secondary | ICD-10-CM | POA: Diagnosis present

## 2017-12-10 DIAGNOSIS — F0390 Unspecified dementia without behavioral disturbance: Secondary | ICD-10-CM | POA: Diagnosis present

## 2017-12-10 DIAGNOSIS — I509 Heart failure, unspecified: Secondary | ICD-10-CM

## 2017-12-10 DIAGNOSIS — F05 Delirium due to known physiological condition: Secondary | ICD-10-CM | POA: Diagnosis not present

## 2017-12-10 DIAGNOSIS — Z87891 Personal history of nicotine dependence: Secondary | ICD-10-CM

## 2017-12-10 DIAGNOSIS — J189 Pneumonia, unspecified organism: Secondary | ICD-10-CM | POA: Diagnosis present

## 2017-12-10 DIAGNOSIS — E119 Type 2 diabetes mellitus without complications: Secondary | ICD-10-CM | POA: Diagnosis present

## 2017-12-10 DIAGNOSIS — I82412 Acute embolism and thrombosis of left femoral vein: Secondary | ICD-10-CM | POA: Diagnosis not present

## 2017-12-10 DIAGNOSIS — Z8673 Personal history of transient ischemic attack (TIA), and cerebral infarction without residual deficits: Secondary | ICD-10-CM

## 2017-12-10 DIAGNOSIS — E875 Hyperkalemia: Secondary | ICD-10-CM | POA: Diagnosis present

## 2017-12-10 DIAGNOSIS — I110 Hypertensive heart disease with heart failure: Secondary | ICD-10-CM | POA: Diagnosis present

## 2017-12-10 HISTORY — DX: Cerebral infarction, unspecified: I63.9

## 2017-12-10 HISTORY — DX: Headache, unspecified: R51.9

## 2017-12-10 HISTORY — DX: Headache: R51

## 2017-12-10 LAB — COMPREHENSIVE METABOLIC PANEL
ALT: 26 U/L (ref 0–44)
AST: 28 U/L (ref 15–41)
Albumin: 3.4 g/dL — ABNORMAL LOW (ref 3.5–5.0)
Alkaline Phosphatase: 69 U/L (ref 38–126)
Anion gap: 10 (ref 5–15)
BUN: 39 mg/dL — ABNORMAL HIGH (ref 8–23)
CHLORIDE: 101 mmol/L (ref 98–111)
CO2: 22 mmol/L (ref 22–32)
Calcium: 9 mg/dL (ref 8.9–10.3)
Creatinine, Ser: 0.96 mg/dL (ref 0.44–1.00)
GFR, EST NON AFRICAN AMERICAN: 53 mL/min — AB (ref >60)
Glucose, Bld: 238 mg/dL — ABNORMAL HIGH (ref 70–99)
POTASSIUM: 5.2 mmol/L — AB (ref 3.5–5.1)
Sodium: 133 mmol/L — ABNORMAL LOW (ref 135–145)
TOTAL PROTEIN: 5.9 g/dL — AB (ref 6.5–8.1)
Total Bilirubin: 1 mg/dL (ref 0.3–1.2)

## 2017-12-10 LAB — CBC WITH DIFFERENTIAL/PLATELET
ABS IMMATURE GRANULOCYTES: 0.03 10*3/uL (ref 0.00–0.07)
Basophils Absolute: 0 10*3/uL (ref 0.0–0.1)
Basophils Relative: 0 %
EOS ABS: 0 10*3/uL (ref 0.0–0.5)
EOS PCT: 0 %
HCT: 43.5 % (ref 36.0–46.0)
Hemoglobin: 13.5 g/dL (ref 12.0–15.0)
Immature Granulocytes: 0 %
Lymphocytes Relative: 10 %
Lymphs Abs: 0.7 10*3/uL (ref 0.7–4.0)
MCH: 29.9 pg (ref 26.0–34.0)
MCHC: 31 g/dL (ref 30.0–36.0)
MCV: 96.2 fL (ref 80.0–100.0)
MONO ABS: 0.5 10*3/uL (ref 0.1–1.0)
MONOS PCT: 6 %
Neutro Abs: 6.4 10*3/uL (ref 1.7–7.7)
Neutrophils Relative %: 84 %
Platelets: 189 10*3/uL (ref 150–400)
RBC: 4.52 MIL/uL (ref 3.87–5.11)
RDW: 14.4 % (ref 11.5–15.5)
WBC: 7.6 10*3/uL (ref 4.0–10.5)
nRBC: 0 % (ref 0.0–0.2)

## 2017-12-10 MED ORDER — SODIUM CHLORIDE 0.9 % IV BOLUS
1000.0000 mL | Freq: Once | INTRAVENOUS | Status: AC
  Administered 2017-12-10: 1000 mL via INTRAVENOUS
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
## 2017-12-10 NOTE — ED Triage Notes (Signed)
Patient arrived with EMS from home , family reported pt. is confused onset 3 days ago with unsteady gait and generalized weakness. Patient is alert , disoriented to time and situation .

## 2017-12-11 ENCOUNTER — Observation Stay (HOSPITAL_COMMUNITY): Payer: Medicare Other

## 2017-12-11 ENCOUNTER — Encounter (HOSPITAL_COMMUNITY): Payer: Self-pay | Admitting: Emergency Medicine

## 2017-12-11 ENCOUNTER — Other Ambulatory Visit: Payer: Self-pay

## 2017-12-11 ENCOUNTER — Emergency Department (HOSPITAL_COMMUNITY): Payer: Medicare Other

## 2017-12-11 DIAGNOSIS — J189 Pneumonia, unspecified organism: Secondary | ICD-10-CM | POA: Diagnosis present

## 2017-12-11 DIAGNOSIS — G9340 Encephalopathy, unspecified: Secondary | ICD-10-CM | POA: Diagnosis not present

## 2017-12-11 DIAGNOSIS — F0390 Unspecified dementia without behavioral disturbance: Secondary | ICD-10-CM | POA: Diagnosis present

## 2017-12-11 DIAGNOSIS — E860 Dehydration: Secondary | ICD-10-CM | POA: Diagnosis present

## 2017-12-11 DIAGNOSIS — K5750 Diverticulosis of both small and large intestine without perforation or abscess without bleeding: Secondary | ICD-10-CM | POA: Diagnosis not present

## 2017-12-11 DIAGNOSIS — R109 Unspecified abdominal pain: Secondary | ICD-10-CM | POA: Diagnosis present

## 2017-12-11 DIAGNOSIS — R4182 Altered mental status, unspecified: Secondary | ICD-10-CM | POA: Diagnosis not present

## 2017-12-11 LAB — URINALYSIS, ROUTINE W REFLEX MICROSCOPIC
Bilirubin Urine: NEGATIVE
GLUCOSE, UA: 50 mg/dL — AB
HGB URINE DIPSTICK: NEGATIVE
KETONES UR: 5 mg/dL — AB
Leukocytes, UA: NEGATIVE
NITRITE: NEGATIVE
PROTEIN: 100 mg/dL — AB
Specific Gravity, Urine: 1.026 (ref 1.005–1.030)
pH: 5 (ref 5.0–8.0)

## 2017-12-11 MED ORDER — SODIUM CHLORIDE 0.9 % IV SOLN
500.0000 mg | INTRAVENOUS | Status: DC
  Administered 2017-12-11 – 2017-12-12 (×2): 500 mg via INTRAVENOUS
  Filled 2017-12-11 (×3): qty 500

## 2017-12-11 MED ORDER — METOPROLOL TARTRATE 12.5 MG HALF TABLET
12.5000 mg | ORAL_TABLET | Freq: Two times a day (BID) | ORAL | Status: DC
  Administered 2017-12-11 – 2017-12-12 (×3): 12.5 mg via ORAL
  Filled 2017-12-11 (×3): qty 1

## 2017-12-11 MED ORDER — ENSURE ENLIVE PO LIQD
237.0000 mL | Freq: Two times a day (BID) | ORAL | Status: DC
  Administered 2017-12-12: 237 mL via ORAL

## 2017-12-11 MED ORDER — ACETAMINOPHEN 325 MG PO TABS: 650.0000 mg | ORAL_TABLET | Freq: Four times a day (QID) | ORAL | Status: DC | PRN

## 2017-12-11 MED ORDER — SODIUM CHLORIDE 0.9 % IV SOLN
1.0000 g | INTRAVENOUS | Status: DC
  Administered 2017-12-11 – 2017-12-14 (×4): 1 g via INTRAVENOUS
  Filled 2017-12-11 (×4): qty 10

## 2017-12-11 MED ORDER — MIRTAZAPINE 15 MG PO TABS
15.0000 mg | ORAL_TABLET | Freq: Every day | ORAL | Status: DC
  Administered 2017-12-11 – 2017-12-16 (×5): 15 mg via ORAL
  Filled 2017-12-11 (×7): qty 1

## 2017-12-11 MED ORDER — SODIUM CHLORIDE 0.9 % IV SOLN
INTRAVENOUS | Status: DC
  Administered 2017-12-11 – 2017-12-13 (×4): via INTRAVENOUS

## 2017-12-11 MED ORDER — ENOXAPARIN SODIUM 40 MG/0.4ML ~~LOC~~ SOLN
40.0000 mg | Freq: Every day | SUBCUTANEOUS | Status: DC
  Administered 2017-12-11: 40 mg via SUBCUTANEOUS
  Filled 2017-12-11 (×2): qty 0.4

## 2017-12-11 MED ORDER — ORAL CARE MOUTH RINSE
15.0000 mL | Freq: Two times a day (BID) | OROMUCOSAL | Status: DC
  Administered 2017-12-12: 15 mL via OROMUCOSAL

## 2017-12-11 MED ORDER — ONDANSETRON HCL 4 MG/2ML IJ SOLN: 4.0000 mg | Freq: Four times a day (QID) | INTRAMUSCULAR | Status: DC | PRN

## 2017-12-11 MED ORDER — CHLORHEXIDINE GLUCONATE 0.12 % MT SOLN
15.0000 mL | Freq: Two times a day (BID) | OROMUCOSAL | Status: DC
  Administered 2017-12-12 – 2017-12-13 (×2): 15 mL via OROMUCOSAL
  Filled 2017-12-11: qty 15

## 2017-12-11 MED ORDER — POLYETHYLENE GLYCOL 3350 17 G PO PACK
17.0000 g | PACK | Freq: Every day | ORAL | Status: DC | PRN
  Filled 2017-12-11: qty 1

## 2017-12-11 NOTE — ED Notes (Signed)
Tried to hook pt back up to monitor. PT insists she will do it to herself and did not want cords on and asked Tech to leave.

## 2017-12-11 NOTE — ED Notes (Signed)
Pt continues to state that she has to use the bathroom; it was explained to Pt in multiple different ways that she is connected to an external female catheter. Pt continues to ask to leave.  Lunch tray has arrived at this time.

## 2017-12-11 NOTE — ED Notes (Signed)
Report attempted 

## 2017-12-11 NOTE — ED Notes (Signed)
Patient is confused, can be re-directed, and cooperative.

## 2017-12-11 NOTE — Progress Notes (Signed)
Notified [name], NP that pt's son states that pt hasn't ate anything in 3-4 days and he wanted something done tonight. Pt wants a feeding tube placed with tube feeds started. Charge nurse, Vladimir Faster also went in room to speak with son. RN and CN told pt's son that pt is receiving IVF. [name], NP stated that we are not inserting feeding tube tonight, that the feeding tube insertion team is not here tonight. [name], NP stated that morning MD can talk with son in the am. Will continue to monitor. Ranelle Oyster, RN

## 2017-12-11 NOTE — ED Notes (Signed)
Pt placed on bedpan by RN and this tech.
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
## 2017-12-11 NOTE — ED Notes (Signed)
Pt on Posey alarm trying to get out of bed, requesting to go home

## 2017-12-11 NOTE — ED Notes (Signed)
Anna-(SR) LunchTray Ordered/called @ 1050-per RN-called by Bodin Gorka 

## 2017-12-11 NOTE — Progress Notes (Signed)
Inpatient Diabetes Program Recommendations  AACE/ADA: New Consensus Statement on Inpatient Glycemic Control (2015)  Target Ranges:  Prepandial:   less than 140 mg/dL      Peak postprandial:   less than 180 mg/dL (1-2 hours)      Critically ill patients:  140 - 180 mg/dL   Lab Results  Component Value Date   GLUCAP 107 (H) 01/01/2013   HGBA1C 6.0 (H) 12/30/2012   Review of Glycemic Control  Diabetes history: DM 2 Outpatient Diabetes medications: None Current orders for Inpatient glycemic control: None at this time  Inpatient Diabetes Program Recommendations:    Note lab glucose last night glucose was 238 mg/dl. MD wanting to wait for fasting glucose to decide on DM regimen while here. Consider checking a CBG now to determine glucose levels as the next labs ordered are in the morning.   Consider Carb modified diet as patient is not noted to have poor po intake. This will help with glucose control if insulin is not ordered.  Thanks,  Tama Headings RN, MSN, BC-ADM Inpatient Diabetes Coordinator Team Pager [phone] (8a-5p)

## 2017-12-11 NOTE — ED Notes (Signed)
Patient transported to CT scan . 

## 2017-12-11 NOTE — Progress Notes (Signed)
[name] is a 84 y.o. female patient admitted from ED awake, alert - oriented  X 4 - no acute distress noted.  VSS - Blood pressure (!) 126/101, pulse 88, temperature 97.7 F (36.5 C), temperature source Axillary, resp. rate (!) 22, height 5\' 5"  (1.651 m), weight 49.9 kg, SpO2 96 %.    IV in place, occlusive dsg intact without redness.  Orientation to room, and floor completed with information packet given to patient/family.  Patient declined safety video at this time.  Admission INP armband ID verified with patient/family, and in place.   SR up x 2, fall assessment complete, with patient and family able to verbalize understanding of risk associated with falls, and verbalized understanding to call nsg before up out of bed.  Call light within reach, patient able to voice, and demonstrate understanding.  Skin, clean-dry- intact without evidence of bruising, or skin tears. Moisture associated breakdown right upper back near shoulder blade and bilateral buttock and groin.     Will cont to eval and treat per MD orders.  Howard Pouch, RN 12/11/2017 8:00 PM

## 2017-12-11 NOTE — ED Notes (Signed)
Lunch tray ordered 
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
## 2017-12-11 NOTE — ED Notes (Signed)
Pt's son arrived at bedside at this time.

## 2017-12-12 ENCOUNTER — Observation Stay (HOSPITAL_COMMUNITY): Payer: Medicare Other | Admitting: Anesthesiology

## 2017-12-12 ENCOUNTER — Inpatient Hospital Stay (HOSPITAL_COMMUNITY): Payer: Medicare Other

## 2017-12-12 ENCOUNTER — Observation Stay (HOSPITAL_COMMUNITY): Payer: Medicare Other

## 2017-12-12 DIAGNOSIS — I2699 Other pulmonary embolism without acute cor pulmonale: Secondary | ICD-10-CM | POA: Diagnosis not present

## 2017-12-12 DIAGNOSIS — Z90710 Acquired absence of both cervix and uterus: Secondary | ICD-10-CM | POA: Diagnosis not present

## 2017-12-12 DIAGNOSIS — R4182 Altered mental status, unspecified: Secondary | ICD-10-CM | POA: Diagnosis not present

## 2017-12-12 DIAGNOSIS — E860 Dehydration: Secondary | ICD-10-CM | POA: Diagnosis present

## 2017-12-12 DIAGNOSIS — Z515 Encounter for palliative care: Secondary | ICD-10-CM | POA: Diagnosis not present

## 2017-12-12 DIAGNOSIS — R609 Edema, unspecified: Secondary | ICD-10-CM | POA: Diagnosis not present

## 2017-12-12 DIAGNOSIS — I5021 Acute systolic (congestive) heart failure: Secondary | ICD-10-CM | POA: Diagnosis not present

## 2017-12-12 DIAGNOSIS — Z79899 Other long term (current) drug therapy: Secondary | ICD-10-CM | POA: Diagnosis not present

## 2017-12-12 DIAGNOSIS — J181 Lobar pneumonia, unspecified organism: Secondary | ICD-10-CM | POA: Diagnosis present

## 2017-12-12 DIAGNOSIS — I82412 Acute embolism and thrombosis of left femoral vein: Secondary | ICD-10-CM | POA: Diagnosis not present

## 2017-12-12 DIAGNOSIS — J9601 Acute respiratory failure with hypoxia: Secondary | ICD-10-CM | POA: Diagnosis present

## 2017-12-12 DIAGNOSIS — I4891 Unspecified atrial fibrillation: Secondary | ICD-10-CM | POA: Diagnosis not present

## 2017-12-12 DIAGNOSIS — M7989 Other specified soft tissue disorders: Secondary | ICD-10-CM | POA: Diagnosis not present

## 2017-12-12 DIAGNOSIS — J9600 Acute respiratory failure, unspecified whether with hypoxia or hypercapnia: Secondary | ICD-10-CM | POA: Diagnosis not present

## 2017-12-12 DIAGNOSIS — I361 Nonrheumatic tricuspid (valve) insufficiency: Secondary | ICD-10-CM | POA: Diagnosis not present

## 2017-12-12 DIAGNOSIS — E7800 Pure hypercholesterolemia, unspecified: Secondary | ICD-10-CM | POA: Diagnosis present

## 2017-12-12 DIAGNOSIS — J9690 Respiratory failure, unspecified, unspecified whether with hypoxia or hypercapnia: Secondary | ICD-10-CM | POA: Diagnosis not present

## 2017-12-12 DIAGNOSIS — I82442 Acute embolism and thrombosis of left tibial vein: Secondary | ICD-10-CM | POA: Diagnosis not present

## 2017-12-12 DIAGNOSIS — E785 Hyperlipidemia, unspecified: Secondary | ICD-10-CM | POA: Diagnosis present

## 2017-12-12 DIAGNOSIS — F419 Anxiety disorder, unspecified: Secondary | ICD-10-CM | POA: Diagnosis present

## 2017-12-12 DIAGNOSIS — I509 Heart failure, unspecified: Secondary | ICD-10-CM | POA: Diagnosis not present

## 2017-12-12 DIAGNOSIS — I10 Essential (primary) hypertension: Secondary | ICD-10-CM | POA: Diagnosis not present

## 2017-12-12 DIAGNOSIS — G9340 Encephalopathy, unspecified: Secondary | ICD-10-CM | POA: Diagnosis not present

## 2017-12-12 DIAGNOSIS — Z9981 Dependence on supplemental oxygen: Secondary | ICD-10-CM | POA: Diagnosis not present

## 2017-12-12 DIAGNOSIS — E119 Type 2 diabetes mellitus without complications: Secondary | ICD-10-CM | POA: Diagnosis present

## 2017-12-12 DIAGNOSIS — I82452 Acute embolism and thrombosis of left peroneal vein: Secondary | ICD-10-CM | POA: Diagnosis not present

## 2017-12-12 DIAGNOSIS — Z743 Need for continuous supervision: Secondary | ICD-10-CM | POA: Diagnosis not present

## 2017-12-12 DIAGNOSIS — F0150 Vascular dementia without behavioral disturbance: Secondary | ICD-10-CM | POA: Diagnosis not present

## 2017-12-12 DIAGNOSIS — F05 Delirium due to known physiological condition: Secondary | ICD-10-CM | POA: Diagnosis not present

## 2017-12-12 DIAGNOSIS — J90 Pleural effusion, not elsewhere classified: Secondary | ICD-10-CM | POA: Diagnosis not present

## 2017-12-12 DIAGNOSIS — Z85820 Personal history of malignant melanoma of skin: Secondary | ICD-10-CM | POA: Diagnosis not present

## 2017-12-12 DIAGNOSIS — J440 Chronic obstructive pulmonary disease with acute lower respiratory infection: Secondary | ICD-10-CM | POA: Diagnosis present

## 2017-12-12 DIAGNOSIS — I5023 Acute on chronic systolic (congestive) heart failure: Secondary | ICD-10-CM | POA: Diagnosis present

## 2017-12-12 DIAGNOSIS — S0003XA Contusion of scalp, initial encounter: Secondary | ICD-10-CM | POA: Diagnosis not present

## 2017-12-12 DIAGNOSIS — I482 Chronic atrial fibrillation, unspecified: Secondary | ICD-10-CM | POA: Diagnosis present

## 2017-12-12 DIAGNOSIS — Z7189 Other specified counseling: Secondary | ICD-10-CM | POA: Diagnosis not present

## 2017-12-12 DIAGNOSIS — R279 Unspecified lack of coordination: Secondary | ICD-10-CM | POA: Diagnosis not present

## 2017-12-12 DIAGNOSIS — G9341 Metabolic encephalopathy: Secondary | ICD-10-CM | POA: Diagnosis present

## 2017-12-12 DIAGNOSIS — Z87891 Personal history of nicotine dependence: Secondary | ICD-10-CM | POA: Diagnosis not present

## 2017-12-12 DIAGNOSIS — F0390 Unspecified dementia without behavioral disturbance: Secondary | ICD-10-CM | POA: Diagnosis present

## 2017-12-12 DIAGNOSIS — R627 Adult failure to thrive: Secondary | ICD-10-CM | POA: Diagnosis present

## 2017-12-12 DIAGNOSIS — R410 Disorientation, unspecified: Secondary | ICD-10-CM | POA: Diagnosis not present

## 2017-12-12 DIAGNOSIS — I82432 Acute embolism and thrombosis of left popliteal vein: Secondary | ICD-10-CM | POA: Diagnosis not present

## 2017-12-12 DIAGNOSIS — J189 Pneumonia, unspecified organism: Secondary | ICD-10-CM | POA: Diagnosis not present

## 2017-12-12 LAB — CBC
HCT: 41 % (ref 36.0–46.0)
HEMATOCRIT: 31.7 % — AB (ref 36.0–46.0)
HEMOGLOBIN: 9.3 g/dL — AB (ref 12.0–15.0)
Hemoglobin: 12.8 g/dL (ref 12.0–15.0)
MCH: 29.7 pg (ref 26.0–34.0)
MCH: 30.5 pg (ref 26.0–34.0)
MCHC: 29.3 g/dL — ABNORMAL LOW (ref 30.0–36.0)
MCHC: 31.2 g/dL (ref 30.0–36.0)
MCV: 101.3 fL — ABNORMAL HIGH (ref 80.0–100.0)
MCV: 97.6 fL (ref 80.0–100.0)
NRBC: 0.4 % — AB (ref 0.0–0.2)
Platelets: 124 10*3/uL — ABNORMAL LOW (ref 150–400)
Platelets: 127 K/uL — ABNORMAL LOW (ref 150–400)
RBC: 3.13 MIL/uL — AB (ref 3.87–5.11)
RBC: 4.2 MIL/uL (ref 3.87–5.11)
RDW: 14.7 % (ref 11.5–15.5)
RDW: 14.6 % (ref 11.5–15.5)
WBC: 5 10*3/uL (ref 4.0–10.5)
WBC: 8.1 K/uL (ref 4.0–10.5)
nRBC: 0 % (ref 0.0–0.2)

## 2017-12-12 LAB — PROTIME-INR
INR: 1.59
PROTHROMBIN TIME: 18.8 s — AB (ref 11.4–15.2)

## 2017-12-12 LAB — POCT I-STAT 3, ART BLOOD GAS (G3+)
Acid-base deficit: 14 mmol/L — ABNORMAL HIGH (ref 0.0–2.0)
Bicarbonate: 13.7 mmol/L — ABNORMAL LOW (ref 20.0–28.0)
O2 Saturation: 99 %
TCO2: 15 mmol/L — ABNORMAL LOW (ref 22–32)
pCO2 arterial: 39.8 mmHg (ref 32.0–48.0)
pH, Arterial: 7.146 — CL (ref 7.350–7.450)
pO2, Arterial: 197 mmHg — ABNORMAL HIGH (ref 83.0–108.0)

## 2017-12-12 LAB — TYPE AND SCREEN
ABO/RH(D): A POS
ANTIBODY SCREEN: NEGATIVE

## 2017-12-12 LAB — COMPREHENSIVE METABOLIC PANEL
ALK PHOS: 145 U/L — AB (ref 38–126)
ALT: 162 U/L — AB (ref 0–44)
AST: 333 U/L — AB (ref 15–41)
Albumin: 2.5 g/dL — ABNORMAL LOW (ref 3.5–5.0)
Anion gap: 11 (ref 5–15)
BUN: 45 mg/dL — AB (ref 8–23)
CALCIUM: 7.3 mg/dL — AB (ref 8.9–10.3)
CHLORIDE: 111 mmol/L (ref 98–111)
CO2: 16 mmol/L — AB (ref 22–32)
CREATININE: 1.33 mg/dL — AB (ref 0.44–1.00)
GFR calc Af Amer: 41 mL/min — ABNORMAL LOW (ref >60)
GFR, EST NON AFRICAN AMERICAN: 36 mL/min — AB (ref >60)
Glucose, Bld: 286 mg/dL — ABNORMAL HIGH (ref 70–99)
Potassium: 5.1 mmol/L (ref 3.5–5.1)
Sodium: 138 mmol/L (ref 135–145)
Total Bilirubin: 1 mg/dL (ref 0.3–1.2)
Total Protein: 4.4 g/dL — ABNORMAL LOW (ref 6.5–8.1)

## 2017-12-12 LAB — ABO/RH: ABO/RH(D): A POS

## 2017-12-12 LAB — CBC WITH DIFFERENTIAL/PLATELET
Abs Immature Granulocytes: 0.03 10*3/uL (ref 0.00–0.07)
BASOS PCT: 0 %
Basophils Absolute: 0 10*3/uL (ref 0.0–0.1)
EOS ABS: 0.1 10*3/uL (ref 0.0–0.5)
Eosinophils Relative: 2 %
HCT: 41.3 % (ref 36.0–46.0)
Hemoglobin: 13 g/dL (ref 12.0–15.0)
IMMATURE GRANULOCYTES: 1 %
Lymphocytes Relative: 14 %
Lymphs Abs: 0.9 10*3/uL (ref 0.7–4.0)
MCH: 30 pg (ref 26.0–34.0)
MCHC: 31.5 g/dL (ref 30.0–36.0)
MCV: 95.4 fL (ref 80.0–100.0)
MONOS PCT: 8 %
Monocytes Absolute: 0.5 10*3/uL (ref 0.1–1.0)
NEUTROS PCT: 75 %
Neutro Abs: 5 10*3/uL (ref 1.7–7.7)
PLATELETS: 172 10*3/uL (ref 150–400)
RBC: 4.33 MIL/uL (ref 3.87–5.11)
RDW: 14.5 % (ref 11.5–15.5)
WBC: 6.6 10*3/uL (ref 4.0–10.5)
nRBC: 0 % (ref 0.0–0.2)

## 2017-12-12 LAB — BASIC METABOLIC PANEL
ANION GAP: 11 (ref 5–15)
BUN: 42 mg/dL — ABNORMAL HIGH (ref 8–23)
CALCIUM: 8.6 mg/dL — AB (ref 8.9–10.3)
CO2: 23 mmol/L (ref 22–32)
Chloride: 104 mmol/L (ref 98–111)
Creatinine, Ser: 1.02 mg/dL — ABNORMAL HIGH (ref 0.44–1.00)
GFR, EST AFRICAN AMERICAN: 57 mL/min — AB (ref >60)
GFR, EST NON AFRICAN AMERICAN: 49 mL/min — AB (ref >60)
GLUCOSE: 184 mg/dL — AB (ref 70–99)
Potassium: 5 mmol/L (ref 3.5–5.1)
SODIUM: 138 mmol/L (ref 135–145)

## 2017-12-12 LAB — GLUCOSE, CAPILLARY
GLUCOSE-CAPILLARY: 278 mg/dL — AB (ref 70–99)
GLUCOSE-CAPILLARY: 223 mg/dL — AB (ref 70–99)
GLUCOSE-CAPILLARY: 207 mg/dL — AB (ref 70–99)
Glucose-Capillary: 273 mg/dL — ABNORMAL HIGH (ref 70–99)
Glucose-Capillary: 229 mg/dL — ABNORMAL HIGH (ref 70–99)

## 2017-12-12 LAB — URINE CULTURE: CULTURE: NO GROWTH

## 2017-12-12 LAB — TROPONIN I
Troponin I: 0.05 ng/mL (ref <0.03)
Troponin I: 0.1 ng/mL (ref <0.03)

## 2017-12-12 LAB — STREP PNEUMONIAE URINARY ANTIGEN: STREP PNEUMO URINARY ANTIGEN: NEGATIVE

## 2017-12-12 LAB — MRSA PCR SCREENING: MRSA by PCR: NEGATIVE

## 2017-12-12 MED ORDER — CHLORHEXIDINE GLUCONATE 0.12% ORAL RINSE (MEDLINE KIT)
15.0000 mL | Freq: Two times a day (BID) | OROMUCOSAL | Status: DC
  Administered 2017-12-13 (×2): 15 mL via OROMUCOSAL

## 2017-12-12 MED ORDER — DIPHENHYDRAMINE HCL 50 MG/ML IJ SOLN
50.0000 mg | Freq: Once | INTRAMUSCULAR | Status: AC
  Administered 2017-12-12: 50 mg via INTRAVENOUS
  Filled 2017-12-12: qty 1

## 2017-12-12 MED ORDER — DEXMEDETOMIDINE HCL IN NACL 400 MCG/100ML IV SOLN
0.4000 ug/kg/h | INTRAVENOUS | Status: DC
  Administered 2017-12-12: 0.2 ug/kg/h via INTRAVENOUS
  Filled 2017-12-12: qty 100

## 2017-12-12 MED ORDER — FENTANYL CITRATE (PF) 100 MCG/2ML IJ SOLN
50.0000 ug | INTRAMUSCULAR | Status: AC | PRN
  Administered 2017-12-12 (×3): 50 ug via INTRAVENOUS
  Filled 2017-12-12: qty 2

## 2017-12-12 MED ORDER — FENTANYL CITRATE (PF) 100 MCG/2ML IJ SOLN
50.0000 ug | INTRAMUSCULAR | Status: DC | PRN
  Administered 2017-12-13: 25 ug via INTRAVENOUS
  Filled 2017-12-12: qty 2

## 2017-12-12 MED ORDER — SODIUM CHLORIDE 0.9 % IV BOLUS
1000.0000 mL | Freq: Once | INTRAVENOUS | Status: AC
  Administered 2017-12-12: 1000 mL via INTRAVENOUS

## 2017-12-12 MED ORDER — IOPAMIDOL (ISOVUE-370) INJECTION 76%
100.0000 mL | Freq: Once | INTRAVENOUS | Status: AC | PRN
  Administered 2017-12-12: 75 mL via INTRAVENOUS

## 2017-12-12 MED ORDER — ENOXAPARIN SODIUM 30 MG/0.3ML ~~LOC~~ SOLN
30.0000 mg | Freq: Every day | SUBCUTANEOUS | Status: DC
  Filled 2017-12-12: qty 0.3

## 2017-12-12 MED ORDER — METHYLPREDNISOLONE SODIUM SUCC 125 MG IJ SOLR
60.0000 mg | Freq: Once | INTRAMUSCULAR | Status: AC
  Administered 2017-12-12: 60 mg via INTRAVENOUS
  Filled 2017-12-12: qty 2

## 2017-12-12 MED ORDER — MIDAZOLAM HCL 2 MG/2ML IJ SOLN
INTRAMUSCULAR | Status: AC
  Administered 2017-12-12: 2 mg
  Filled 2017-12-12: qty 2

## 2017-12-12 MED ORDER — HYDROCORTISONE NA SUCCINATE PF 250 MG IJ SOLR
200.0000 mg | Freq: Once | INTRAMUSCULAR | Status: AC
  Administered 2017-12-12: 200 mg via INTRAVENOUS
  Filled 2017-12-12: qty 200

## 2017-12-12 MED ORDER — ORAL CARE MOUTH RINSE
15.0000 mL | OROMUCOSAL | Status: DC
  Administered 2017-12-12 – 2017-12-13 (×10): 15 mL via OROMUCOSAL

## 2017-12-12 MED ORDER — HEPARIN (PORCINE) IN NACL 100-0.45 UNIT/ML-% IJ SOLN
800.0000 [IU]/h | INTRAMUSCULAR | Status: DC
  Administered 2017-12-13: 700 [IU]/h via INTRAVENOUS
  Filled 2017-12-12: qty 250

## 2017-12-12 MED ORDER — PANTOPRAZOLE SODIUM 40 MG PO PACK
40.0000 mg | PACK | Freq: Every day | ORAL | Status: DC
  Administered 2017-12-12 – 2017-12-14 (×2): 40 mg
  Filled 2017-12-12 (×4): qty 20

## 2017-12-12 MED ORDER — IOPAMIDOL (ISOVUE-370) INJECTION 76%
INTRAVENOUS | Status: AC
  Filled 2017-12-12: qty 100

## 2017-12-12 MED ORDER — MIDAZOLAM HCL 2 MG/2ML IJ SOLN: 1.0000 mg | INTRAMUSCULAR | Status: DC | PRN

## 2017-12-12 MED ORDER — INSULIN ASPART 100 UNIT/ML ~~LOC~~ SOLN
0.0000 [IU] | SUBCUTANEOUS | Status: DC
  Administered 2017-12-12: 2 [IU] via SUBCUTANEOUS
  Administered 2017-12-13 (×3): 1 [IU] via SUBCUTANEOUS
  Administered 2017-12-13: 3 [IU] via SUBCUTANEOUS
  Administered 2017-12-14: 1 [IU] via SUBCUTANEOUS
  Administered 2017-12-15: 2 [IU] via SUBCUTANEOUS
  Administered 2017-12-15 – 2017-12-17 (×7): 1 [IU] via SUBCUTANEOUS

## 2017-12-12 MED ORDER — FENTANYL CITRATE (PF) 100 MCG/2ML IJ SOLN
INTRAMUSCULAR | Status: AC
  Filled 2017-12-12: qty 2

## 2017-12-12 MED FILL — Medication: Qty: 1 | Status: AC

## 2017-12-12 NOTE — Progress Notes (Signed)
Inpatient Diabetes Program Recommendations  AACE/ADA: New Consensus Statement on Inpatient Glycemic Control (2019)  Target Ranges:  Prepandial:   less than 140 mg/dL      Peak postprandial:   less than 180 mg/dL (1-2 hours)      Critically ill patients:  140 - 180 mg/dL   Results for MARJO, GROSVENOR (MRN [id number]) as of 12/12/2017 12:26  Ref. Range 12/12/2017 12:05  Glucose-Capillary Latest Ref Range: 70 - 99 mg/dL 278 (H)   Results for REANNON, CANDELLA (MRN [id number]) as of 12/12/2017 12:26  Ref. Range 12/10/2017 22:16 12/12/2017 03:53  Glucose Latest Ref Range: 70 - 99 mg/dL 238 (H) 184 (H)   Review of Glycemic Control  Diabetes history: DM2 Outpatient Diabetes medications: None Current orders for Inpatient glycemic control: None  Inpatient Diabetes Program Recommendations:  Correction (SSI): If appropriate, please consider ordering CBGs with Novolog 0-9 units TID with meals.  Thanks, Barnie Alderman, RN, MSN, CDE Diabetes Coordinator Inpatient Diabetes Program [phone] (Team Pager from 8am to 5pm)

## 2017-12-12 NOTE — Progress Notes (Signed)
[facility] Progress Note Patient Name: [name] DOB: 12/04/1931 MRN: [id number]   Date of Service  12/12/2017  HPI/Events of Note  CT scan with bilateral segmental PE R>L (confirmed with radiology).  Also with bliateral pleural effusion with right sided loculation. NO active bleeding despite drop in H/H. NG with minimal blood but flushes clear.  eICU Interventions  Awaiting repeat CBC and baseline coags prior to initiation of heparin drip.  Son, who was at bedside updated on findings and plans.     Intervention Category Major Interventions: Hemorrhage - evaluation and management Minor Interventions: Communication with other healthcare providers and/or family;Other: Evaluation Type: Kreg Shropshire 12/12/2017, 9:59 PM

## 2017-12-12 NOTE — Evaluation (Signed)
Physical Therapy Evaluation Patient Details Name: [name] MRN: [id number] DOB: August 21, 1931 Today's Date: 12/12/2017   History of Present Illness  84yo female with baseline dementia who presented to the ED with AMS and fatigue, unable to provide significant history other than "not feeling good". PMH A-fib, chronic chest pain, COPD, hx DVT, HTN, hx of home O2 use (not currently on O2 per son), syncope, DM, cardiac cath, knee surgery, lumbar surgery, RCR   Clinical Impression   Patient received in bed, sleeping but easily woken, A&O to self only this morning and reoriented by PT as appropriate. Son arrived mid-session and provided PLOF and equipment history. Able to perform supine to sit with ModA and heavy Mod VC, able to maintain upright sitting with min guard and requires MinA for functional sit to stand with RW but very unsteady and requiring ongoing MinA for balance and safety. Patient declines ambulation today despite multiple attempts by PT and encouragement from son. Able to return to bed with MinA and able to scoot up in bed with MinA to steady feet so she could push through her legs. She was left in bed with all needs met, son present, and speech therapist present and attending. She will continue to benefit from skilled PT services in the acute setting as well as ongoing skilled services in the ST-SNF setting moving forward.     Follow Up Recommendations SNF    Equipment Recommendations  Other (comment)(defer to next venue )    Recommendations for Other Services OT consult     Precautions / Restrictions Precautions Precautions: Fall Restrictions Weight Bearing Restrictions: No      Mobility  Bed Mobility Overal bed mobility: Needs Assistance Bed Mobility: Supine to Sit;Sit to Supine     Supine to sit: Mod assist Sit to supine: Min assist   General bed mobility comments: ModA and Mod VC for sequencing to get out of bed, MInA to get back in bed due to improved  motivation to return to bed   Transfers Overall transfer level: Needs assistance Equipment used: Rolling walker (2 wheeled) Transfers: Sit to/from Stand Sit to Stand: Min assist         General transfer comment: MinA for balance and steadying, quickly sits back down stating "let me go back to bed"   Ambulation/Gait             General Gait Details: patient declined   Stairs            Wheelchair Mobility    Modified Rankin (Stroke Patients Only)       Balance Overall balance assessment: Needs assistance Sitting-balance support: Bilateral upper extremity supported;Feet supported Sitting balance-Leahy Scale: Fair     Standing balance support: Bilateral upper extremity supported;During functional activity Standing balance-Leahy Scale: Poor                               Pertinent Vitals/Pain Pain Assessment: Faces Pain Score: 0-No pain Faces Pain Scale: No hurt Pain Intervention(s): Limited activity within patient's tolerance;Monitored during session    [place] expects to be discharged to:: Private residence Living Arrangements: Children Available Help at Discharge: Family;Available 24 hours/day Type of Home: House Home Access: Stairs to enter Entrance Stairs-Rails: Can reach both Entrance Stairs-Number of Steps: 3 Home Layout: One level Home Equipment: Walker - 2 wheels;Shower seat Additional Comments: lives with one of her sons, per his report at baseline is very independent  with RW    Prior Function Level of Independence: Independent with assistive device(s)         Comments: RW     Hand Dominance        Extremity/Trunk Assessment   Upper Extremity Assessment Upper Extremity Assessment: Defer to OT evaluation    Lower Extremity Assessment Lower Extremity Assessment: Generalized weakness    Cervical / Trunk Assessment Cervical / Trunk Assessment: Kyphotic  Communication   Communication: No  difficulties  Cognition Arousal/Alertness: Awake/alert Behavior During Therapy: Flat affect;Impulsive Overall Cognitive Status: Impaired/Different from baseline Area of Impairment: Orientation;Attention;Memory;Following commands;Safety/judgement;Awareness;Problem solving                 Orientation Level: Disoriented to;Place;Time;Situation Current Attention Level: Sustained Memory: Decreased short-term memory Following Commands: Follows one step commands inconsistently;Follows one step commands with increased time Safety/Judgement: Decreased awareness of safety;Decreased awareness of deficits Awareness: Intellectual Problem Solving: Slow processing;Decreased initiation;Difficulty sequencing;Requires verbal cues General Comments: history of dementia at baseline, son reports that current cognition is not at her baseline level however       General Comments General comments (skin integrity, edema, etc.): SpO2 89-92% on room air     Exercises     Assessment/Plan    PT Assessment Patient needs continued PT services  PT Problem List Decreased strength;Decreased cognition;Decreased knowledge of use of DME;Decreased activity tolerance;Decreased safety awareness;Decreased balance;Decreased mobility;Decreased coordination       PT Treatment Interventions DME instruction;Balance training;Gait training;Neuromuscular re-education;Stair training;Functional mobility training;Patient/family education;Therapeutic activities;Therapeutic exercise    PT Goals (Current goals can be found in the Care Plan section)  Acute Rehab PT Goals Patient Stated Goal: get stronger, return home  PT Goal Formulation: With family Time For Goal Achievement: 12/26/17 Potential to Achieve Goals: Fair    Frequency Min 2X/week   Barriers to discharge        Co-evaluation               AM-PAC PT "6 Clicks" Daily Activity  Outcome Measure Difficulty turning over in bed (including adjusting  bedclothes, sheets and blankets)?: Unable Difficulty moving from lying on back to sitting on the side of the bed? : Unable Difficulty sitting down on and standing up from a chair with arms (e.g., wheelchair, bedside commode, etc,.)?: Unable Help needed moving to and from a bed to chair (including a wheelchair)?: A Little Help needed walking in hospital room?: A Little Help needed climbing 3-5 steps with a railing? : A Lot 6 Click Score: 11    End of Session Equipment Utilized During Treatment: Gait belt Activity Tolerance: Patient tolerated treatment well Patient left: in bed;with call bell/phone within reach;with family/visitor present;Other (comment)(speech therapist present and attending )   PT Visit Diagnosis: Unsteadiness on feet (R26.81);Difficulty in walking, not elsewhere classified (R26.2);Muscle weakness (generalized) (M62.81)    Time: 8264-1583 PT Time Calculation (min) (ACUTE ONLY): 27 min   Charges:   PT Evaluation $PT Eval Moderate Complexity: 1 Mod PT Treatments $Therapeutic Activity: 8-22 mins        Deniece Ree PT, DPT, CBIS  Supplemental Physical Therapist [place]    Pager [phone] Acute Rehab Office [phone]

## 2017-12-12 NOTE — Progress Notes (Signed)
[facility] Progress Note Patient Name: [name] DOB: 31-Aug-1931 MRN: [id number]   Date of Service  12/12/2017  HPI/Events of Note  Labs reviewed. Discussed with son risk/benefit of anticoagulation and he expressed understanding.  eICU Interventions  Initiate heparin drip, no bolus given due to increased risk for bleed. Aptt q 4 target 60-80     Intervention Category Evaluation Type: Kreg Shropshire 12/12/2017, 11:32 PM

## 2017-12-12 NOTE — Anesthesia Procedure Notes (Addendum)
Procedure Name: Awake intubation Date/Time: 12/12/2017 11:38 AM Performed by: Duane Boston, MD Pre-anesthesia Checklist: Patient identified, Emergency Drugs available, Suction available and Patient being monitored Patient Re-evaluated:Patient Re-evaluated prior to induction Oxygen Delivery Method: Ambu bag Preoxygenation: Pre-oxygenation with 100% oxygen Laryngoscope Size: Miller and 2 Grade View: Grade II Tube type: Oral Tube size: 7.5 mm Number of attempts: 1 Placement Confirmation: ETT inserted through vocal cords under direct vision,  CO2 detector and breath sounds checked- equal and bilateral Secured at: 24 cm Tube secured with: Tape

## 2017-12-12 NOTE — Consult Note (Signed)
84 year old with dementia emergently transferred to the ICU after being intubated for respiratory arrest.  She was admitted on 10/28 for decreased cognition and generalized weakness, head CT was negative, MRI cannot be obtained due to implantable loop recorder, CT abdomen/pelvis suggested left lower lobe pneumonia and she was being treated as community-acquired pneumonia with ceftriaxone and azithromycin. She has a history of atrial fibrillation status post ablation and "COPD", never smoker attributed to secondhand smoke  History obtained from son who was present during the event-she had a good morning and suddenly became unresponsive with agonal respirations and required emergent intubation, never lost her pulse. Chest x-ray personally reviewed which shows cardiomegaly with bilateral effusions and appearance of a lobulated shadow in the right upper zone  On exam-elderly, now sedated with Versed and fentanyl since she was moving around after arrival to the ICU, pupils 3 mm bilaterally equally reactive to light, clear breath sounds bilateral, no rhonchi, S1-S2 irregular, no edema no JVD.  Labs reviewed which show drop in hemoglobin from 13-9, mild hyperkalemia, good renal function will do BUN elevated.  Impression/plan  Acute respiratory failure/arrest-cause unclear, will have to rule out neurological event, would be atypical for pulmonary embolism but will obtain CT angiogram for completion Ventilator settings were reviewed and adjusted, chest x-ray shows ET tube in position, ABG will be checked  Hypotension -likely related to meds, fluid bolus given  Acute encephalopathy-we will proceed with head CT  Left lower lobe community acquired pneumonia/rule out aspiration-continue ceftriaxone and azithromycin.  Hemoglobin drop-no evidence of blood loss, NG aspirate appears clear, monitor hemoglobin and transfuse if less than 8  Detailed discussion with son at the bedside, DNR issued, he would like [place]  to do reasonable things and if poor prognosis and he would be agreeable to withdrawal of life support.  Suggested that we continue current measures for 24 to 48 hours to see if we can pinpoint the problem  My critical care time x 60 minutes  Kara Mead MD. [facility]. [place] [facility] Pager [phone] If no response call [phone]   12/12/2017

## 2017-12-12 NOTE — Progress Notes (Signed)
Son called RN to room stating that pt needed to void. When Rn entered room pt was laying in bed, Pt son stated to RN "she looks different."when RN assesed pt, the pt was unresponsive with blank stare on face, pts face was grey in color With pulse but agonally breathing. A code was called and pt was bagged, code team arrived. Pt transferred to 38M and report given to  Nurse on 38M.

## 2017-12-12 NOTE — Progress Notes (Addendum)
Informed by RN-patient apparently started having sudden onset of agonal breathing after she was moved to the bedside commode.  This MD had seen the patient x2 earlier this morning-she although was slow-was able to answer a lot of my questions appropriately.  Had been evaluated by speech therapy earlier this morning as well.  CODE BLUE was called by the nursing staff-apparently patient never lost pulse.  Telemetry showed rate controlled A. fib.  When I arrived-code team was at bedside-respiratory therapist was bagging the patient.  She was completely unresponsive.  Anesthesia subsequently arrived and intubated the patient.  Etiology of sudden respiratory arrest remains unknown-not sure if she had mucous plugging or this was a neurological event.  Have ordered stat labs/EKG/chest x-ray to check ET tube placement.  Orders to transfer the patient to the ICU have been placed.  Further work-up will be done by the ICU team.  I subsequently had a long discussion with the patient's son-he states that the patient did not want long-term ventilator support/aggressive care if there was no reasonable chance of any semblance of quality of life,which is already poor to begin with due to dementia.  He did agree for a DNR order in case patient deteriorates and worsens further.He is of the opinion that if over the next few hours/days-it is apparent that she will have a poor outcome/poor quality of life-he is open to withdrawing life support.  Spoke with PCCM MD Dr. Elsworth Soho and also with PCCM NP Richardson Landry Minor-regarding above.  Total critical care time spent equals 45 minutes.

## 2017-12-12 NOTE — Consult Note (Signed)
PULMONARY / CRITICAL CARE MEDICINE   NAME:  [name], MRN:  [id number], DOB:  [birth year], LOS: 0 ADMISSION DATE:  12/10/2017, CONSULTATION DATE: 12/12/2017 REFERRING MD: [facility], CHIEF COMPLAINT: Post respiratory arrest  BRIEF HISTORY:    Status post respiratory arrest 12/12/2017 HISTORY OF PRESENT ILLNESS   84 year old female with history of dementia plus multiple other health issues is being ambulated to the bathroom by family member and aide when she quit breathing and slumped to the floor.  She never lost her pulse she is in atrial fibrillation ventricular response.  Code was called anesthesia.  And intubate her for respiratory insufficiency.  She was transferred to the intensive care unit and was made a DNR by her son during transition.  Down the intensive care unit she is a DNR.  CT scan of the head is been in check for completeness to rule out a neurological event and her respiratory distress.  Son expressed a low threshold to transition to full comfort care should she not improve. SIGNIFICANT PAST MEDICAL HISTORY   Documented well  SIGNIFICANT EVENTS:  12/12/2017 code called for respiratory distress and intubated STUDIES:   12/12/2017 CT of the head CULTURES:  12/12/2017 sputum culture>>  ANTIBIOTICS:   LINES/TUBES:   12/12/2017 endotracheal tube>> CONSULTANTS:  12/12/2017 pulmonary critical care SUBJECTIVE:  84 year old female with a plethora of health issues including dementia failure to thrive along with syncope and collapse he was in her usual poor state of health while walking to the bathroom became unresponsive and stopped breathing and required intubation per anesthesia  CONSTITUTIONAL: BP (!) 131/97   Pulse (!) 108   Temp 97.6 F (36.4 C) (Oral)   Resp 17   Ht 5\' 2"  (1.575 m)   Wt 49.9 kg   SpO2 100%   BMI 20.12 kg/m   I/O last 3 completed shifts: In: 2094.9 [I.V.:844.9; IV Piggyback:1250] Out: 100 [Urine:100]     Vent Mode: PRVC FiO2 (%):  [100 %]  100 % Set Rate:  [18 bmp] 18 bmp Vt Set:  [400 mL] 400 mL PEEP:  [5 cmH20] 5 cmH20 Plateau Pressure:  [15 cmH20] 15 cmH20  PHYSICAL EXAM: General: Frail elderly female currently on ventilator Neuro: Moves all his extremities spontaneously HEENT: Endotracheal tube to ventilator Cardiovascular: Heart sounds are regular Lungs: Diminished in the bases Abdomen: Soft nontender Musculoskeletal: Wasted Skin: Dry  RESOLVED PROBLEM LIST   ASSESSMENT AND PLAN   Ventilator dependent respiratory failure secondary to hypoxic event.  Questionable causative agent may be neurological never lost a pulse COPD Transfer to the intensive care unit Full ventilatory support for now CT of the head to rule out neurological event She is now a DNR per family request Low threshold to make full comfort care Continue full support for now   Suspected community-acquired pneumonia  placed on ceftriaxone and Zithromax 12/11/2017  Mental status altered Dementia Currently thrashing on the ventilator Sedated with fentanyl Versed CT of the head rule out neurological event   Chronic atrial fibrillation Beta-blockers Use Neo-Synephrine his blood pressure support as needed  Diabetes Sliding scale insulin  SUMMARY OF TODAY'S PLAN:  84 year old patient with dementia transferred to the intensive care unit after having respiratory events but never lost her pulse required intubation by anesthesia.  Best Practice / Goals of Care / Disposition.   DVT PROPHYLAXIS: Lovenox weight heparin SUP: PPI NUTRITION: Nothing by mouth MOBILITY: Bedrest GOALS OF CARE: Resuscitate FAMILY DISCUSSIONS: Son was spoken to by [facility] hospitalist patient is a DNR  and he has a low threshold to go to full comfort care for DISPOSITION transfer to intensive care unit  LABS  Glucose Recent Labs  Lab 12/12/17 1205  GLUCAP 278*    BMET Recent Labs  Lab 12/10/17 2216 12/12/17 0353  NA 133* 138  K 5.2* 5.0  CL 101 104  CO2  22 23  BUN 39* 42*  CREATININE 0.96 1.02*  GLUCOSE 238* 184*    Liver Enzymes Recent Labs  Lab 12/10/17 2216  AST 28  ALT 26  ALKPHOS 69  BILITOT 1.0  ALBUMIN 3.4*    Electrolytes Recent Labs  Lab 12/10/17 2216 12/12/17 0353  CALCIUM 9.0 8.6*    CBC Recent Labs  Lab 12/10/17 2216 12/12/17 0353  WBC 7.6 6.6  HGB 13.5 13.0  HCT 43.5 41.3  PLT 189 172    ABG No results for input(s): PHART, PCO2ART, PO2ART in the last 168 hours.  Coag's No results for input(s): APTT, INR in the last 168 hours.  Sepsis Markers No results for input(s): LATICACIDVEN, PROCALCITON, O2SATVEN in the last 168 hours.  Cardiac Enzymes No results for input(s): TROPONINI, PROBNP in the last 168 hours.  PAST MEDICAL HISTORY :   She  has a past medical history of A-fib ([place]), Allergic rhinitis, Anemia, Angina, Anxiety, Arthritis, Asthma, Blood transfusion (?1970s), Chronic cough, COPD (chronic obstructive pulmonary disease) ([place]), Daily headache, Depression, DVT (deep venous thrombosis) ([place]), H/O hiatal hernia, High cholesterol, Hypertension, LV dysfunction, On home O2, Sinusitis, Skin melanoma ([place]), Stroke ([place]), Supraventricular tachycardia ([place]), Syncope, Type II diabetes mellitus ([place]), and Uses nebulizer and inhaler at home.  PAST SURGICAL HISTORY:  She  has a past surgical history that includes Thoracotomy (1993); Vesicovaginal fistula closure w/ TAH (age 62); Lumbar spine surgery; Palate surgery; Knee cartilage surgery; transthoracic echocardiogram (03/09/2009); US ECHOCARDIOGRAPHY (04/2008); Esophagogastroduodenoscopy (02/22/2011); Tonsillectomy and adenoidectomy; Appendectomy; Abdominal hysterectomy; Loop recorder implant (04/09/2009); Rotator cuff repair (Right); Cardiac electrophysiology study and ablation (2001); Skin cancer excision (03/03/11); laparotomy (08/30/2011); Small intestine surgery; left heart catheterization with coronary angiogram (N/A, 02/20/2011); Cardiac catheterization; Back  surgery; Breast biopsy (08/2000); and Lung biopsy.  Allergies  Allergen Reactions  . Epinephrine Other (See Comments)    Patient thinks her heart stopped and the MD said never to take this again.  Clementeen Hoof [Iodinated Diagnostic Agents] Other (See Comments)    Unknown-patient said she thinks her heart stopped, but she really can't remember  . Asa [Aspirin] Other (See Comments)    Heart doctor told her to take advil instead    No current facility-administered medications on file prior to encounter.    Current Outpatient Medications on File Prior to Encounter  Medication Sig  . metoprolol tartrate (LOPRESSOR) 25 MG tablet Take 12.5 mg by mouth 2 (two) times daily.  . mirtazapine (REMERON) 15 MG tablet Take 15 mg by mouth at bedtime.  . Multiple Vitamin (MULTIVITAMIN WITH MINERALS) TABS tablet Take 1 tablet by mouth daily.    FAMILY HISTORY:   Her family history includes Cancer in her unknown relative; Diabetes in her unknown relative; Heart disease in her unknown relative. There is no history of Anesthesia problems, Hypotension, Malignant hyperthermia, or Pseudochol deficiency.  SOCIAL HISTORY:  She  reports that she quit smoking about 58 years ago. Her smoking use included cigarettes. She has never used smokeless tobacco. She reports that she does not drink alcohol or use drugs.  REVIEW OF SYSTEMS:    Unavailable   App CCT45 min  Richardson Landry Kazuma Elena  ACNP Maryanna Shape PCCM Pager [phone] till 1 pm If no answer page [phone] 12/12/2017, 12:24 PM

## 2017-12-12 NOTE — Progress Notes (Signed)
*  Preliminary Results* Bilateral lower extremity venous duplex completed.  Right: There is no evidence of deep vein thrombosis in the lower extremity. No cystic structure found in the popliteal fossa.  Left: Findings consistent with age indeterminate deep vein thrombosis involving the left femoral vein, left popliteal vein, left posterior tibial vein, and left peroneal vein. No cystic structure found in the popliteal fossa.   [name] 12/12/2017 4:27 PM

## 2017-12-12 NOTE — Progress Notes (Signed)
Notified [name], NP that pt has multiple hard white bumps on vagina. Pt unable to tell nursing staff what they are.  [name], NP stated that she will let dayshift MD know. Will continue to monitor pt. [name]. RN

## 2017-12-12 NOTE — Progress Notes (Signed)
OT Cancellation Note  Patient Details Name: DENYM CHRISTENBERRY MRN: [id number] DOB: [birth year]   Cancelled Treatment:    Reason Eval/Treat Not Completed: Medical issues which prohibited therapy(Code blue today, intubated.) Will follow.  Malka So 12/12/2017, 1:48 PM  Nestor Lewandowsky, OTR/L Acute Rehabilitation Services Pager: [phone] Office: [phone]
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
## 2017-12-12 NOTE — Progress Notes (Signed)
   12/12/17 1751  Clinical Encounter Type  Visited With Patient and family together  Visit Type Initial  Referral From Nurse  Consult/Referral To Chaplain  Chaplain responded to RN spiritual care request for Pt. son. The chaplain was given permission to enter the Pt. room by the son after an introduction.  The son shared his anxiety about his mother's care with the chaplain.The Pt. son decided to sit down while talking with the chaplain. The chaplain noticed the Pt. son relaxed when he was encouraged to engage in conversation with the healthcare team about the Pt. care.  The chaplain exited for another call before the Pt. left for a CT scan. The Pt. was informed of the presence of spiritual care throughout the evening.

## 2017-12-12 NOTE — Progress Notes (Signed)
CRITICAL VALUE ALERT  Critical Value:  Troponin .05  Date & Time Notied:  12/12/17 @ 1530  Provider Notified: Dr. Elsworth Soho
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
## 2017-12-12 NOTE — Progress Notes (Signed)
RT Note-Code blue response. On arrival patient being bagged with 100% BMV. Moved to head of the bed and continued ventilating until intubation supplies given for intubation. One attempt made with cords visualized ETT passed and patient then bite down and ETT became dislodged. MD intubated 7.5 and secured at 24. End tidal co2 positive with BBS. Transferred to ICU 2M05.

## 2017-12-13 ENCOUNTER — Inpatient Hospital Stay (HOSPITAL_COMMUNITY): Payer: Medicare Other

## 2017-12-13 LAB — TROPONIN I: Troponin I: 0.14 ng/mL (ref <0.03)

## 2017-12-13 LAB — CBC
HCT: 36.2 % (ref 36.0–46.0)
Hemoglobin: 11.7 g/dL — ABNORMAL LOW (ref 12.0–15.0)
MCH: 30.7 pg (ref 26.0–34.0)
MCHC: 32.3 g/dL (ref 30.0–36.0)
MCV: 95 fL (ref 80.0–100.0)
Platelets: 144 10*3/uL — ABNORMAL LOW (ref 150–400)
RBC: 3.81 MIL/uL — ABNORMAL LOW (ref 3.87–5.11)
RDW: 14.6 % (ref 11.5–15.5)
WBC: 7.6 10*3/uL (ref 4.0–10.5)
nRBC: 0 % (ref 0.0–0.2)

## 2017-12-13 LAB — HEPARIN LEVEL (UNFRACTIONATED)
HEPARIN UNFRACTIONATED: 0.24 [IU]/mL — AB (ref 0.30–0.70)
Heparin Unfractionated: 0.39 IU/mL (ref 0.30–0.70)

## 2017-12-13 LAB — BASIC METABOLIC PANEL
Anion gap: 8 (ref 5–15)
BUN: 48 mg/dL — ABNORMAL HIGH (ref 8–23)
CALCIUM: 8.1 mg/dL — AB (ref 8.9–10.3)
CHLORIDE: 113 mmol/L — AB (ref 98–111)
CO2: 18 mmol/L — AB (ref 22–32)
CREATININE: 1.07 mg/dL — AB (ref 0.44–1.00)
GFR calc Af Amer: 54 mL/min — ABNORMAL LOW (ref >60)
GFR calc non Af Amer: 46 mL/min — ABNORMAL LOW (ref >60)
GLUCOSE: 156 mg/dL — AB (ref 70–99)
Potassium: 4.2 mmol/L (ref 3.5–5.1)
Sodium: 139 mmol/L (ref 135–145)

## 2017-12-13 LAB — PROTIME-INR
INR: 1.49
INR: 1.52
INR: 1.53
INR: 1.55
INR: 1.67
PROTHROMBIN TIME: 18.2 s — AB (ref 11.4–15.2)
Prothrombin Time: 19.5 seconds — ABNORMAL HIGH (ref 11.4–15.2)
Prothrombin Time: 18.4 seconds — ABNORMAL HIGH (ref 11.4–15.2)
Prothrombin Time: 17.9 seconds — ABNORMAL HIGH (ref 11.4–15.2)
Prothrombin Time: 18.1 seconds — ABNORMAL HIGH (ref 11.4–15.2)

## 2017-12-13 LAB — GLUCOSE, CAPILLARY
GLUCOSE-CAPILLARY: 136 mg/dL — AB (ref 70–99)
GLUCOSE-CAPILLARY: 118 mg/dL — AB (ref 70–99)
Glucose-Capillary: 108 mg/dL — ABNORMAL HIGH (ref 70–99)
Glucose-Capillary: 130 mg/dL — ABNORMAL HIGH (ref 70–99)
Glucose-Capillary: 126 mg/dL — ABNORMAL HIGH (ref 70–99)

## 2017-12-13 LAB — PHOSPHORUS: PHOSPHORUS: 4.3 mg/dL (ref 2.5–4.6)

## 2017-12-13 LAB — MAGNESIUM: Magnesium: 2.1 mg/dL (ref 1.7–2.4)

## 2017-12-13 MED ORDER — ALBUTEROL SULFATE (2.5 MG/3ML) 0.083% IN NEBU: 2.5000 mg | INHALATION_SOLUTION | RESPIRATORY_TRACT | Status: DC | PRN

## 2017-12-13 MED ORDER — QUETIAPINE FUMARATE 25 MG PO TABS
25.0000 mg | ORAL_TABLET | Freq: Every day | ORAL | Status: DC
  Administered 2017-12-13 – 2017-12-15 (×2): 25 mg via ORAL
  Filled 2017-12-13 (×2): qty 1

## 2017-12-13 MED ORDER — FUROSEMIDE 10 MG/ML IJ SOLN
20.0000 mg | Freq: Once | INTRAMUSCULAR | Status: AC
  Administered 2017-12-13: 20 mg via INTRAVENOUS
  Filled 2017-12-13: qty 2

## 2017-12-13 MED ORDER — SODIUM CHLORIDE 0.9 % IV BOLUS
250.0000 mL | Freq: Once | INTRAVENOUS | Status: AC
  Administered 2017-12-13: 250 mL via INTRAVENOUS

## 2017-12-13 MED ORDER — HEPARIN (PORCINE) IN NACL 100-0.45 UNIT/ML-% IJ SOLN
850.0000 [IU]/h | INTRAMUSCULAR | Status: DC
  Administered 2017-12-13: 800 [IU]/h via INTRAVENOUS
  Administered 2017-12-15: 850 [IU]/h via INTRAVENOUS
  Administered 2017-12-16: 800 [IU]/h via INTRAVENOUS
  Filled 2017-12-13 (×3): qty 250

## 2017-12-13 NOTE — Progress Notes (Signed)
[facility] Progress Note Patient Name: [name] DOB: 10-Oct-1931 MRN: [id number]   Date of Service  12/13/2017  HPI/Events of Note  Notified of hypotension and relative bradycardia.  Patient was on 0.5 of Precedex now decreased to 0.2  eICU Interventions  Instructed bedside nurse to hold Precedex for now, patient had received Fentanyl earlier. Ordered a 250 NS bolus     Intervention Category Major Interventions: Hypotension - evaluation and management  Judd Lien 12/13/2017, 3:37 AM

## 2017-12-13 NOTE — Progress Notes (Signed)
SLP Cancellation Note  Patient Details Name: NATHALEE SMARR MRN: [id number] DOB: [birth year]   Cancelled treatment:       Reason Eval/Treat Not Completed: Fatigue/lethargy limiting ability to participate- discussed with RN.  Will f/u next date.   Juan Quam Laurice 12/13/2017, 2:30 PM

## 2017-12-13 NOTE — Progress Notes (Signed)
   NAME:  [name], MRN:  [id number], DOB:  10-Nov-1931, LOS: 1 ADMISSION DATE:  12/10/2017, CONSULTATION DATE:  12/12/17 REFERRING MD:  TRH, CHIEF COMPLAINT:  Respiratory failure   Brief History   84 year old with dementia.  Admitted to hospital for acute encephalopathy secondary to community-acquired pneumonia. Respiratory arrest yesterday, emergently intubated.  Consults: date of consult/date signed off & final recs:  PCCM 10/30  Procedures (surgical and bedside):  Intubated 10/30  Subjective:  Self-extubated this morning.  Denies chest, abdominal pain or dyspnea.  Objective    Examination: Vitals: normotensive, mildly tachycardic in Afib on no vasopressors. General: frail, calm. HENT: No ulceration for ETT, dry mucus membranes Lungs: no distress, mild central wheezing. Bronchial breath sounds at L base. Cardiovascular: JVP elevated, no edema. HS normal. Abdomen: soft and non-tender. Extremities: xerodermia.  Neuro: Calm and cooperative, moves all limbs. GU: Clear urine in Foley  I performed a bedside ultrasound study that revealed a severely dilated LV with severely reduced systolic, elevated E/e' suggests elevated LVEDP. RV normal size with moderately reduced function. Mild TR/MR.   Resolved Hospital Problem list    Assessment & Plan:  Acute hypoxic respiratory failure - appears to be tolerating self extubation. Community acquired pneumonia suspected clinically, but no leukocytosis, and CT not convincing for infiltrate. Pulmonary embolism of unclear etiology. HFREF with advanced LV dysfunction. Encephalopathy on background of dementia - former appears resolved. Atrial fibrillation.  Plan  High flow oxygen via nasal cannulae - wean to keep saturations > 88% Bronchodilators for associated reactive airways. Furosemide 20mg  iv now but may require more. Once oxygenation improved will introduce GDHFT to improve symptom control. Progressive ambulation. Minimize  sedative and optimize day night cycle. At risk for sundowning. AF is currently spontanously well rate controlled.  Disposition / Summary of Today's Plan 12/13/17   Progress care. Clarify goals of care with son. Anticipate transfer to floor tomorrow.  Best practice (discussed on multidisciplinary rounds  Nutritional status and diet: SLP and advance to oral diet Pain/Anxiety/Delirium reduction: No sedatives. VAP protocol (if indicated) Not required. DVT prophylaxis: on systemic anticoagulation GI prophylaxis: not indicated. Hyperglycemia protocol:  Normoglycemic on no coverage. Mobility: progressive ambulation. Antibiotic de-escalation: Step-down to ceftriaxone alone, stop if cultures negative. Code Status: DNR. Family Communication: Son updated at bedside.  Labs and Ancillary Testing (personally reviewed)  CBC: no leukocytosis.  Chemistry: hyperchloremic acidosis.  ABG N/A  Coagulation Profile: heparin subtherapeutic at 0.24  Cardiac Enzymes: mild troponin elevation consistent with demand/strain.  CBG:  Microbiology: Respiratory cultures are pending. Blood cultures are negative.  CT head 10/30: small vessel disease. Nil acute.  CTA chest 10/30: shows bilateral effusions and small subsegmental emboli. Atelectasis but no clear infiltrate.    Kipp Brood, MD [facility] ICU Physician [place]  Pager: [phone] Mobile: [phone] After hours: [phone].

## 2017-12-13 NOTE — Progress Notes (Signed)
PMT RN Note: Consult order noted. PMT is experiencing very high consult volume and will be staffed with emergency coverage only on 11/1 due to the team being out of the office at the [facility].   If you need interim recommendations, please call [phone] to leave a message for the nurse. All medical emergencies should be directed to the attending physician.   Once there is an available provider (likely 11/2 or 11/3 due to weekend staffing), this patient will be evaluated and seen by our team.  Marjie Skiff. Porcia Morganti, RN, BSN, [facility] Palliative Medicine Team 12/13/2017 1:41 PM Office [phone]

## 2017-12-13 NOTE — Progress Notes (Signed)
Pt self-extubated.  Pt placed on [place] HFNC @ 10 LPM.  Pt not in distress at this time.  Will continue to monitor.  Pt states "Please take me home".  RN @ bedside.

## 2017-12-13 NOTE — Progress Notes (Signed)
Initial Nutrition Assessment  DOCUMENTATION CODES:   Not applicable  INTERVENTION:    Monitor for ability to advance PO diet and add PO supplements as appropriate  NUTRITION DIAGNOSIS:   Inadequate oral intake related to inability to eat as evidenced by NPO status.  GOAL:   Patient will meet greater than or equal to 90% of their needs  MONITOR:   Diet advancement, PO intake, Labs  REASON FOR ASSESSMENT:   Consult, Malnutrition Screening Tool Assessment of nutrition requirement/status  ASSESSMENT:   84 y.o. female with medical history significant of DM; systolic CHF; dementia; HTN; HLD; COPD on home O2; and afib presenting with AMS and fatigue.    Patient required intubation and was moved to the ICU yesterday afternoon. She self extubated this morning and is now DNR. Unable to complete NFPE or obtain any nutrition history at this time.   Per discussion with RN, patient is not alert enough to take PO's at this time.  Labs reviewed. CBG's: [phone] Medications reviewed and include Novolog, Remeron.  Per review of weight encounters, no recent weight available; weight has increased by 6.3 kg over the past 2.5 years.   NUTRITION - FOCUSED PHYSICAL EXAM:  unable to complete NFPE at this time  Diet Order:   Diet Order            Diet NPO time specified  Diet effective now              EDUCATION NEEDS:   No education needs have been identified at this time  Skin:  Skin Assessment: Reviewed RN Assessment  Last BM:  12/11/17  Height:   Ht Readings from Last 1 Encounters:  12/12/17 5\' 2"  (1.575 m)    Weight:   Wt Readings from Last 1 Encounters:  12/10/17 49.9 kg    Ideal Body Weight:  56.8 kg  BMI:  Body mass index is 20.12 kg/m.  Estimated Nutritional Needs:   Kcal:  1300-1500  Protein:  60-70 gm  Fluid:  1.5 L    Molli Barrows, RD, LDN, CNSC Pager [phone] After Hours Pager [phone]

## 2017-12-13 NOTE — Progress Notes (Signed)
ANTICOAGULATION CONSULT NOTE  Pharmacy Consult for heparin Indication: pulmonary embolus   Patient Measurements: Height: 5\' 2"  (157.5 cm) Weight: 110 lb (49.9 kg) IBW/kg (Calculated) : 50.1 Heparin Dosing Weight: 49.9 kg  Vital Signs: Temp: 98.6 F (37 C) (10/31 1924) Temp Source: Oral (10/31 1924) BP: 103/62 (10/31 1800) Pulse Rate: 99 (10/31 1800)  Labs: Recent Labs    12/12/17 0353 12/12/17 1151 12/12/17 1356 12/12/17 2119  12/13/17 0344 12/13/17 0756 12/13/17 1447 12/13/17 1817  HGB 13.0 9.3*  --  12.8  --  11.7*  --   --   --   HCT 41.3 31.7*  --  41.0  --  36.2  --   --   --   PLT 172 124*  --  127*  --  144*  --   --   --   LABPROT  --   --   --  18.8*   < > 19.5* 18.4* 17.9*  --   INR  --   --   --  1.59   < > 1.67 1.55 1.49  --   HEPARINUNFRC  --   --   --   --   --   --  0.24*  --  0.39  CREATININE 1.02*  --  1.33*  --   --  1.07*  --   --   --   TROPONINI  --   --  0.05* 0.10*  --  0.14*  --   --   --    < > = values in this interval not displayed.     Assessment: 84 year old female s/p respiratory arrest 10/30. Pharmacy has been consulted for heparin dosing in PE. Hgb decreased to 11.7, PTLc remains low-stable at 144K. Heparin level is therapeutic.    Goal of Therapy:  Heparin level 0.3-0.5 units/ml  Monitor platelets by anticoagulation protocol: Yes    Plan:  Continue heparin at 800 units/hr Daily heparin level, CBC   [name], [name] 12/13/2017,7:45 PM
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
## 2017-12-14 ENCOUNTER — Inpatient Hospital Stay (HOSPITAL_COMMUNITY): Payer: Medicare Other

## 2017-12-14 DIAGNOSIS — I361 Nonrheumatic tricuspid (valve) insufficiency: Secondary | ICD-10-CM

## 2017-12-14 LAB — ECHOCARDIOGRAM COMPLETE
HEIGHTINCHES: 62 in
Weight: 1760 oz

## 2017-12-14 LAB — CBC
HCT: 36.7 % (ref 36.0–46.0)
Hemoglobin: 11.4 g/dL — ABNORMAL LOW (ref 12.0–15.0)
MCH: 29.7 pg (ref 26.0–34.0)
MCHC: 31.1 g/dL (ref 30.0–36.0)
MCV: 95.6 fL (ref 80.0–100.0)
Platelets: 193 10*3/uL (ref 150–400)
RBC: 3.84 MIL/uL — AB (ref 3.87–5.11)
RDW: 14.9 % (ref 11.5–15.5)
WBC: 9.3 10*3/uL (ref 4.0–10.5)
nRBC: 0.3 % — ABNORMAL HIGH (ref 0.0–0.2)

## 2017-12-14 LAB — HEPARIN LEVEL (UNFRACTIONATED)
Heparin Unfractionated: 0.25 IU/mL — ABNORMAL LOW (ref 0.30–0.70)
Heparin Unfractionated: 0.5 IU/mL (ref 0.30–0.70)

## 2017-12-14 LAB — GLUCOSE, CAPILLARY
GLUCOSE-CAPILLARY: 94 mg/dL (ref 70–99)
GLUCOSE-CAPILLARY: 127 mg/dL — AB (ref 70–99)
Glucose-Capillary: 109 mg/dL — ABNORMAL HIGH (ref 70–99)
Glucose-Capillary: 124 mg/dL — ABNORMAL HIGH (ref 70–99)
Glucose-Capillary: 85 mg/dL (ref 70–99)
Glucose-Capillary: 89 mg/dL (ref 70–99)

## 2017-12-14 MED ORDER — LABETALOL HCL 5 MG/ML IV SOLN: 10.0000 mg | INTRAVENOUS | Status: DC | PRN

## 2017-12-14 MED ORDER — METOPROLOL TARTRATE 12.5 MG HALF TABLET
12.5000 mg | ORAL_TABLET | Freq: Two times a day (BID) | ORAL | Status: DC
  Administered 2017-12-14 (×2): 12.5 mg via ORAL
  Filled 2017-12-14 (×2): qty 1

## 2017-12-14 MED ORDER — FUROSEMIDE 40 MG PO TABS
40.0000 mg | ORAL_TABLET | Freq: Every day | ORAL | Status: DC
  Filled 2017-12-14: qty 1

## 2017-12-14 MED ORDER — CEFDINIR 300 MG PO CAPS
300.0000 mg | ORAL_CAPSULE | Freq: Two times a day (BID) | ORAL | Status: DC
  Filled 2017-12-14 (×2): qty 1

## 2017-12-14 MED ORDER — LISINOPRIL 5 MG PO TABS
2.5000 mg | ORAL_TABLET | Freq: Every day | ORAL | Status: DC
  Administered 2017-12-16 – 2017-12-17 (×2): 2.5 mg via ORAL
  Filled 2017-12-14 (×3): qty 1

## 2017-12-14 MED ORDER — ORAL CARE MOUTH RINSE
15.0000 mL | Freq: Two times a day (BID) | OROMUCOSAL | Status: DC
  Administered 2017-12-15 – 2017-12-17 (×4): 15 mL via OROMUCOSAL

## 2017-12-14 MED ORDER — FUROSEMIDE 10 MG/ML IJ SOLN
40.0000 mg | Freq: Once | INTRAMUSCULAR | Status: DC
  Filled 2017-12-14: qty 4

## 2017-12-14 NOTE — Progress Notes (Signed)
ANTICOAGULATION CONSULT NOTE  Pharmacy Consult for heparin Indication: pulmonary embolus   Patient Measurements: Height: 5\' 2"  (157.5 cm) Weight: 110 lb (49.9 kg) IBW/kg (Calculated) : 50.1 Heparin Dosing Weight: 49.9 kg  Vital Signs: Temp: 98.1 F (36.7 C) (11/01 1118) Temp Source: Oral (11/01 1118) BP: 118/89 (11/01 1200) Pulse Rate: 95 (11/01 1400)  Labs: Recent Labs    12/12/17 0353  12/12/17 1356 12/12/17 2119  12/13/17 0344  12/13/17 0756 12/13/17 1447 12/13/17 1817 12/14/17 0325 12/14/17 1258  HGB 13.0   < >  --  12.8  --  11.7*  --   --   --   --  11.4*  --   HCT 41.3   < >  --  41.0  --  36.2  --   --   --   --  36.7  --   PLT 172   < >  --  127*  --  144*  --   --   --   --  193  --   LABPROT  --   --   --  18.8*   < > 19.5*  --  18.4* 17.9* 18.1*  --   --   INR  --   --   --  1.59   < > 1.67  --  1.55 1.49 1.52  --   --   HEPARINUNFRC  --   --   --   --   --   --    < > 0.24*  --  0.39 0.25* 0.50  CREATININE 1.02*  --  1.33*  --   --  1.07*  --   --   --   --   --   --   TROPONINI  --   --  0.05* 0.10*  --  0.14*  --   --   --   --   --   --    < > = values in this interval not displayed.     Assessment: 84 year old female s/p respiratory arrest 10/30. Pharmacy has been consulted for heparin dosing in PE. Heparin level is therapeutic.  Goal of Therapy:  Heparin level 0.3-0.5 units/ml  Monitor platelets by anticoagulation protocol: Yes   Plan:  Continue heparin at 900 units/hr Daily heparin level, CBC F/u plan for long term anticoagulation   Harvel Quale  12/14/2017,3:12 PM

## 2017-12-14 NOTE — Care Management (Addendum)
Update:  Per pharmacy pt will likely not need lovenox at discharge - if it is needed another CM consult will be placed  CM received consult regarding pt discharging on lovenox.  CM contacted pharmacy for dose, frequency and duration so benefit check could be sent.  Pharmacy to follow up with CM post rounds - as pt may not need lovenox at discharge.

## 2017-12-14 NOTE — Progress Notes (Signed)
Patient transferred from 2M05 to room 5M20. Patient is alert self and place. No signs and symptoms of distress. Will continue to monitor. Dorthey Sawyer, RN

## 2017-12-14 NOTE — Progress Notes (Signed)
  Echocardiogram 2D Echocardiogram has been performed.  [name] 12/14/2017, 2:27 PM

## 2017-12-14 NOTE — Progress Notes (Signed)
ANTICOAGULATION CONSULT NOTE  Pharmacy Consult for heparin Indication: pulmonary embolus   Patient Measurements: Height: 5\' 2"  (157.5 cm) Weight: 110 lb (49.9 kg) IBW/kg (Calculated) : 50.1 Heparin Dosing Weight: 49.9 kg  Vital Signs: Temp: 98.4 F (36.9 C) (11/01 0337) Temp Source: Oral (11/01 0337) BP: 118/88 (11/01 0600) Pulse Rate: 94 (11/01 0600)  Labs: Recent Labs    12/12/17 0353  12/12/17 1356 12/12/17 2119  12/13/17 0344 12/13/17 0756 12/13/17 1447 12/13/17 1817 12/14/17 0325  HGB 13.0   < >  --  12.8  --  11.7*  --   --   --  11.4*  HCT 41.3   < >  --  41.0  --  36.2  --   --   --  36.7  PLT 172   < >  --  127*  --  144*  --   --   --  193  LABPROT  --   --   --  18.8*   < > 19.5* 18.4* 17.9* 18.1*  --   INR  --   --   --  1.59   < > 1.67 1.55 1.49 1.52  --   HEPARINUNFRC  --   --   --   --   --   --  0.24*  --  0.39 0.25*  CREATININE 1.02*  --  1.33*  --   --  1.07*  --   --   --   --   TROPONINI  --   --  0.05* 0.10*  --  0.14*  --   --   --   --    < > = values in this interval not displayed.     Assessment: 84 year old female s/p respiratory arrest 10/30. Pharmacy has been consulted for heparin dosing in PE. Heparin level this am 0.25 units/ml  Goal of Therapy:  Heparin level 0.3-0.5 units/ml  Monitor platelets by anticoagulation protocol: Yes   Plan:  Increase heparin to 900 units/hr Check heparin level in 6 hours Daily heparin level, CBC  Nikea Settle [place]et 12/14/2017,6:30 AM

## 2017-12-14 NOTE — Progress Notes (Signed)
  Speech Language Pathology Treatment: Dysphagia  Patient Details Name: [name] MRN: [id number] DOB: Jan 18, 1932 Today's Date: 12/14/2017 Time: 5366-4403 SLP Time Calculation (min) (ACUTE ONLY): 27 min  Assessment / Plan / Recommendation Clinical Impression  Pt looks clinically quite different from initial evaluation on 10/30. She is very drowsy, and per RN was up a lot of last night due to sundowning. Mod cues were given for arousal and sustained attention as well as awareness for bolus acceptance, which was not needed during initial evaluation. When pt does consume thin liquids, she has multiple subswallows and immediate coughing that follows larger, consecutive straw sips. No overt signs of aspiration are noted with purees, although what she will take off the spoon at a time is minimal. Discussed changes in function with pt and her son, explaining rationale for recommendation to remain NPO except for essential meds crushed in puree and ice chips after oral care (when fully alert). Intubation was brief, but self-extubation is more traumatic. Her age, further deconditioning, and cognitive status could all further exacerbate a potential post-extubation dysphagia. Will plan to f/u with pt when more alert to better assess need for MBS vs potential to restart PO diet.   HPI HPI: Pt is an 84 y.o. female with medical history significant for DM; systolic CHF; dementia; HTN; HLD; COPD on home O2; chronic cough; and afib presenting with AMS, fatigue, mild dehydration. CT Head negative (unable to have MRI); CT abdomen showed lower lobe infiltrates L>R. Pt had code blue called on 10/30. CTA Chest revealed bilateral PE. She was intubated 10/30 but self-extubated 10/31.      SLP Plan  Goals updated       Recommendations  Diet recommendations: NPO;Other(comment)(few ice chips after oral care if alert) Medication Administration: Crushed with puree                Oral Care Recommendations: Oral  care QID Follow up Recommendations: (tba) SLP Visit Diagnosis: Dysphagia, unspecified (R13.10) Plan: Goals updated       GO                Germain Osgood 12/14/2017, 11:10 AM  Germain Osgood, M.A. [place] Acute Environmental education officer [phone] Office [phone]

## 2017-12-14 NOTE — Progress Notes (Addendum)
NAME:  [name], MRN:  [id number], DOB:  1931/04/24, LOS: 2 ADMISSION DATE:  12/10/2017, CONSULTATION DATE:  12/12/17 REFERRING MD:  TRH, CHIEF COMPLAINT:  Respiratory failure   Brief History   84 year old F admitted on 10/28 with acute encephalopathy secondary to community acquired pneumonia superimposed on baseline dementia. She suffered a respiratory arrest and required intubation. CTA chest positive for bilateral PE without CT evidence of R heart strain.  Self extubated 10/30 am.    Consults: date of consult/date signed off & final recs:  PCCM 10/30  Procedures (surgical and bedside):  Intubated 10/30 >> 10/30  Studies:  CT Head 10/30 >> negative CTA Chest 10/30 >> bilateral pulmonary emboli, greatest in RML branches, no CT evidence of right heart strain, large pleural effusions with associated atelectasis, right effusion is likely loculated  Cultures:  BCx2 10/29 >>  UC 10/29 >> negative Sputum 10/30 >>   Antibiotics:  Azithro 10/29 >> 10/31  Ceftriaxone 10/30 >> 11/1 Cefdinir 11/1 >> 11/5  Subjective:  RN reports pt had significant sundowning overnight. Off O2.  Pt remains extubated. I/O - net 4.5L positive, ~ 750 ml UOP with lasix.  Afebrile.   Objective    Examination: General: frail, elderly female lying in bed in NAD HEENT: MM pink/dry Neuro: drowsy, awakens to voice, answers clearly  CV: s1s2 rrr, no m/r/g PULM: even/non-labored, lungs bilaterally with bibasilar crackles  CZ:YSAY, non-tender, bsx4 active  Extremities: warm/dry, 1+ BLE pitting edema & changes consistent with chronic venous stasis   Skin: no rashes or lesions  Resolved Hospital Problem list     Assessment & Plan:   Acute Hypoxemic Respiratory Failure  - in setting of PE, CHF, effusions and possible CAP  P: Continue O2 for saturations > 90% Pulmonary hygiene  D4/7 abx, stop date added  Follow intermittent CXR   Pulmonary Embolism  - unclear etiology  P: O2 as above  Change  to eliquis per pharmacy   HFrEF - advanced LV dysfunction  P: Monitor I/O's  Await ECHO  Add lisinopril  Atrial Fibrillation  P: Tele monitoring, rate controlled Resume home lopressor 12.5mg  BID   Acute Metabolic Encephalopathy superimposed on Dementia  P: PT efforts  Promote sleep wake cycle Continue home remeron  On seroquel, added for sun downing, reassess closer to discharge Transfer to tele  At Risk Dysphagia  P: SLP evaluation pending  Disposition / Summary of Today's Plan 12/14/17   Transfer to telemetry.  To [facility] as of 11/2.  May need SNF placement.   Best practice (discussed on multidisciplinary rounds  Nutritional status and diet: SLP and advance to oral diet VAP protocol (if indicated) n/a DVT prophylaxis: on systemic anticoagulation GI prophylaxis: n/a Hyperglycemia protocol:  n/a Mobility: progressive ambulation. Antibiotic de-escalation: Rocephin, stop date in place Code Status: DNR. Family Communication: No family at bedside.   Labs and Ancillary Testing    Recent Labs  Lab 12/12/17 2119 12/13/17 0344 12/14/17 0325  HGB 12.8 11.7* 11.4*  HCT 41.0 36.2 36.7  WBC 8.1 7.6 9.3  PLT 127* 144* 193   Recent Labs  Lab 12/10/17 2216 12/12/17 0353 12/12/17 1356 12/13/17 0344  NA 133* 138 138 139  K 5.2* 5.0 5.1 4.2  CL 101 104 111 113*  CO2 22 23 16* 18*  GLUCOSE 238* 184* 286* 156*  BUN 39* 42* 45* 48*  CREATININE 0.96 1.02* 1.33* 1.07*  CALCIUM 9.0 8.6* 7.3* 8.1*  MG  --   --   --  2.1  PHOS  --   --   --  4.3    PCXR - no new film 11/1.  10/31 CXR notable for small bilateral pleural effusions with compressive atelectasis  Noe Gens, NP-C [facility] Pgr: [phone] or if no answer [phone] 12/14/2017, 9:13 AM

## 2017-12-15 ENCOUNTER — Inpatient Hospital Stay (HOSPITAL_COMMUNITY): Payer: Medicare Other

## 2017-12-15 DIAGNOSIS — J9600 Acute respiratory failure, unspecified whether with hypoxia or hypercapnia: Secondary | ICD-10-CM

## 2017-12-15 DIAGNOSIS — I2699 Other pulmonary embolism without acute cor pulmonale: Secondary | ICD-10-CM

## 2017-12-15 DIAGNOSIS — F0390 Unspecified dementia without behavioral disturbance: Secondary | ICD-10-CM

## 2017-12-15 DIAGNOSIS — I10 Essential (primary) hypertension: Secondary | ICD-10-CM

## 2017-12-15 DIAGNOSIS — Z515 Encounter for palliative care: Secondary | ICD-10-CM

## 2017-12-15 DIAGNOSIS — I5021 Acute systolic (congestive) heart failure: Secondary | ICD-10-CM

## 2017-12-15 DIAGNOSIS — I4891 Unspecified atrial fibrillation: Secondary | ICD-10-CM

## 2017-12-15 DIAGNOSIS — I509 Heart failure, unspecified: Secondary | ICD-10-CM

## 2017-12-15 LAB — CBC
HCT: 37.8 % (ref 36.0–46.0)
HEMOGLOBIN: 11.8 g/dL — AB (ref 12.0–15.0)
MCH: 30.2 pg (ref 26.0–34.0)
MCHC: 31.2 g/dL (ref 30.0–36.0)
MCV: 96.7 fL (ref 80.0–100.0)
NRBC: 0.5 % — AB (ref 0.0–0.2)
PLATELETS: 180 10*3/uL (ref 150–400)
RBC: 3.91 MIL/uL (ref 3.87–5.11)
RDW: 15.2 % (ref 11.5–15.5)
WBC: 6.6 10*3/uL (ref 4.0–10.5)

## 2017-12-15 LAB — BASIC METABOLIC PANEL
ANION GAP: 12 (ref 5–15)
BUN: 40 mg/dL — ABNORMAL HIGH (ref 8–23)
CALCIUM: 8.4 mg/dL — AB (ref 8.9–10.3)
CO2: 19 mmol/L — AB (ref 22–32)
Chloride: 109 mmol/L (ref 98–111)
Creatinine, Ser: 0.87 mg/dL (ref 0.44–1.00)
GFR, EST NON AFRICAN AMERICAN: 59 mL/min — AB (ref >60)
GLUCOSE: 117 mg/dL — AB (ref 70–99)
POTASSIUM: 4.1 mmol/L (ref 3.5–5.1)
SODIUM: 140 mmol/L (ref 135–145)

## 2017-12-15 LAB — GLUCOSE, CAPILLARY
GLUCOSE-CAPILLARY: 106 mg/dL — AB (ref 70–99)
GLUCOSE-CAPILLARY: 107 mg/dL — AB (ref 70–99)
GLUCOSE-CAPILLARY: 111 mg/dL — AB (ref 70–99)
GLUCOSE-CAPILLARY: 130 mg/dL — AB (ref 70–99)
GLUCOSE-CAPILLARY: 147 mg/dL — AB (ref 70–99)
Glucose-Capillary: 174 mg/dL — ABNORMAL HIGH (ref 70–99)
Glucose-Capillary: 144 mg/dL — ABNORMAL HIGH (ref 70–99)

## 2017-12-15 LAB — HEPARIN LEVEL (UNFRACTIONATED)
HEPARIN UNFRACTIONATED: 0.56 [IU]/mL (ref 0.30–0.70)
Heparin Unfractionated: 0.6 IU/mL (ref 0.30–0.70)

## 2017-12-15 MED ORDER — QUETIAPINE FUMARATE 25 MG PO TABS: 25.0000 mg | ORAL_TABLET | Freq: Every evening | ORAL | Status: DC | PRN

## 2017-12-15 MED ORDER — FUROSEMIDE 10 MG/ML IJ SOLN: 40.0000 mg | Freq: Once | INTRAMUSCULAR | Status: DC

## 2017-12-15 MED ORDER — FUROSEMIDE 10 MG/ML IJ SOLN
20.0000 mg | Freq: Every day | INTRAMUSCULAR | Status: DC
  Administered 2017-12-15 – 2017-12-17 (×3): 20 mg via INTRAVENOUS
  Filled 2017-12-15 (×3): qty 2

## 2017-12-15 MED ORDER — METOPROLOL TARTRATE 5 MG/5ML IV SOLN
2.5000 mg | Freq: Four times a day (QID) | INTRAVENOUS | Status: DC
  Administered 2017-12-15 – 2017-12-17 (×6): 2.5 mg via INTRAVENOUS
  Filled 2017-12-15 (×6): qty 5

## 2017-12-15 MED ORDER — SODIUM CHLORIDE 0.9 % IV SOLN
1.0000 g | INTRAVENOUS | Status: DC
  Administered 2017-12-15 – 2017-12-16 (×2): 1 g via INTRAVENOUS
  Filled 2017-12-15 (×3): qty 10

## 2017-12-15 NOTE — Progress Notes (Signed)
OT Cancellation Note  Patient Details Name: [name] MRN: [id number] DOB: 1931-08-12   Cancelled Treatment:    Reason Eval/Treat Not Completed: Medical issues which prohibited therapy.  Spoke with RN who requested OT hold off.  Per RN, pt not following commands, not interacting with environment.  She reports palliative is to meet with son today.  Will check back to determine if OT appropriate.  Lucille Passy, OTR/L Acute Rehabilitation Services Pager [phone] Office [phone]   Lucille Passy M 12/15/2017, 9:45 AM

## 2017-12-15 NOTE — Progress Notes (Signed)
Paient son has some concerns and questions about paient's nutrition. RN and Agricultural consultant explained the reasons why pt is currently NPO. Son would like to speak with MD. RN paged MD, advised MD of the situation and allow paient son to speak with MD.

## 2017-12-15 NOTE — Progress Notes (Signed)
ANTICOAGULATION CONSULT NOTE  Pharmacy Consult for heparin Indication: pulmonary embolus  Patient Measurements: Height: 5\' 2"  (157.5 cm) Weight: 110 lb (49.9 kg) IBW/kg (Calculated) : 50.1 Heparin Dosing Weight: 49.9 kg  Vital Signs: Temp: 97.5 F (36.4 C) (11/02 1650) Temp Source: Oral (11/02 1650) BP: 139/93 (11/02 1650) Pulse Rate: 108 (11/02 1650)  Labs: Recent Labs    12/12/17 2119  12/13/17 0344  12/13/17 0756 12/13/17 1447 12/13/17 1817 12/14/17 0325 12/14/17 1258 12/15/17 0651 12/15/17 1656  HGB 12.8  --  11.7*  --   --   --   --  11.4*  --  11.8*  --   HCT 41.0  --  36.2  --   --   --   --  36.7  --  37.8  --   PLT 127*  --  144*  --   --   --   --  193  --  180  --   LABPROT 18.8*   < > 19.5*  --  18.4* 17.9* 18.1*  --   --   --   --   INR 1.59   < > 1.67  --  1.55 1.49 1.52  --   --   --   --   HEPARINUNFRC  --   --   --    < > 0.24*  --  0.39 0.25* 0.50 0.56 0.60  CREATININE  --   --  1.07*  --   --   --   --   --   --  0.87  --   TROPONINI 0.10*  --  0.14*  --   --   --   --   --   --   --   --    < > = values in this interval not displayed.   Assessment: [name] s/p respiratory arrest 10/30. Pharmacy has been consulted for heparin dosing for PE. Heparin level remains above goal at 0.60 despite rate decrease this AM.  Hgb 11.8, pltc WNL stable. No bleeding noted.  Goal of Therapy:  Heparin level 0.3-0.5 units/ml  Monitor platelets by anticoagulation protocol: Yes   Plan:  Decrease heparin gtt to 800 units/hr Recheck heparin level with AM labs Daily heparin level and CBC Monitor s/sx of bleeding F/u long term AC plan  Erin N. Gerarda Fraction, PharmD PGY2 Infectious Diseases Pharmacy Resident Phone: [phone] 12/15/2017       5:38 PM

## 2017-12-15 NOTE — Progress Notes (Signed)
PROGRESS NOTE    [name]  JQB:[id number] DOB: 1931/04/03 DOA: 12/10/2017 PCP: Aura Dials, MD   Brief Narrative:  HPI On 12/11/2017 by Dr. Karmen Bongo [name] is a 84 y.o. female with medical history significant of DM; systolic CHF; dementia; HTN; HLD; COPD on home O2; and afib presenting with AMS and fatigue.  Her son was not present at the time of my evaluation and the patient was not able to provide an effective history.  She reported not feeling well but really was unable to be more specific than that.  Interim history Admitted for acute encephalopathy secondary to community-acquired pneumonia with superimposed on baseline dementia.  Patient suffered respiratory arrest and required intubation.  CTA showed positive bilateral PEs without CT evidence of right heart strain.  Patient was extubated on 12/12/2017.  Patient transferred out of ICU and to [facility] service on 12/15/2017.  Of note patient was found to have presumptive diagnosis of heart failure and was diuresed.  She currently remains on room air.  Telemetry of care has also been consulted.  Assessment & Plan   Acute hypoxemic respiratory failure -Improving, patient did require intubation and self extubated on 12/12/2017 -Suspect hypoxia and respiratory failure pulmonary embolism, CHF and possible community-acquired pneumonia (continue Omnicef) -Treat underlying causes  Bilateral pulmonary embolism -CTA chest showed bilateral pulmonary emboli, greatest in the right middle lobe branches.  Large pleural effusions with associated atelectasis, right effusion is likely loculated. -Lower extremity Doppler: Right, no DVT.  Left, intermediate DVT involving left femoral, popliteal, posterior tibial, left peroneal veins. -Patient currently on heparin, plan is to possibly transition to Eliquis-however given patient's dementia and baseline, wonder if this is an appropriate treatment  Acute systolic CHF  exacerbation -Echocardiogram EF of 10 to 15%, diffuse hypokinesis.  PA peak pressure 49 mmHg.  Large left pleural effusion.  Moderate TR -Continue Lasix, lisinopril, metoprolol -Monitor daily weights, intake and output  Atrial fibrillation -Appears to be rate controlled -Continue metoprolol and currently on heparin  Acute metabolic encephalopathy superimposed on dementia -Suspect secondary to hypoxia and respiratory failure -CT head showed no acute intracranial finding x2 -Continue Remeron, Seroquel added for sundowning  Dysphagia? -Patient appears to be at risk for dysphasia, pending speech evaluation  Goals of care and CODE STATUS -Patient currently DNR -Palliative care consulted and appreciated, pending evaluation  DVT Prophylaxis brain  Code Status: DNR  Family Communication: None at bedside  Disposition Plan: Admitted.  Pending palliative care consultation.  Consultants PCCM Palliative care  Procedures  Echocardiogram Lower extremity Doppler  Antibiotics   Anti-infectives (From admission, onward)   Start     Dose/Rate Route Frequency Ordered Stop   12/15/17 0900  cefdinir (OMNICEF) capsule 300 mg     300 mg Oral Every 12 hours 12/14/17 1331 12/19/17 0959   12/11/17 1000  cefTRIAXone (ROCEPHIN) 1 g in sodium chloride 0.9 % 100 mL IVPB  Status:  Discontinued     1 g 200 mL/hr over 30 Minutes Intravenous Every 24 hours 12/11/17 0936 12/14/17 1331   12/11/17 1000  azithromycin (ZITHROMAX) 500 mg in sodium chloride 0.9 % 250 mL IVPB  Status:  Discontinued     500 mg 250 mL/hr over 60 Minutes Intravenous Every 24 hours 12/11/17 0936 12/13/17 0940      Subjective:   [name] seen and examined today.  Not interactive this morning.  Objective:   Vitals:   12/14/17 1536 12/14/17 2025 12/15/17 0619 12/15/17 0901  BP: (!) 139/97 Marland Kitchen)  142/94 (!) 138/94 (!) 127/96  Pulse: 82 (!) 104 83 (!) 58  Resp: 16   18  Temp: 97.6 F (36.4 C) (!) 97.4 F (36.3 C)  98 F  (36.7 C)  TempSrc: Oral Oral  Oral  SpO2: 95% 95% 94% 93%  Weight:      Height:        Intake/Output Summary (Last 24 hours) at 12/15/2017 1226 Last data filed at 12/15/2017 5643 Gross per 24 hour  Intake 0 ml  Output 200 ml  Net -200 ml   Filed Weights   12/10/17 2207  Weight: 49.9 kg    Exam  General: Well developed, frail, chronically ill-appearing, NAD  HEENT: NCAT, mucous membranes dry  Neck: Supple  Cardiovascular: S1 S2 auscultated, RRR  Respiratory: Clear to auscultation bilaterally with equal chest rise  Abdomen: Soft, nontender, nondistended, + bowel sounds  Extremities: warm dry without cyanosis clubbing.  Lower extremity edema, B/L.  Chronic venous stasis, dry flaky skin.  Neuro: Cannot assess.  Patient with dementia at baseline.  Psych: cannot assess   Data Reviewed: I have personally reviewed following labs and imaging studies  CBC: Recent Labs  Lab 12/10/17 2216 12/12/17 0353 12/12/17 1151 12/12/17 2119 12/13/17 0344 12/14/17 0325 12/15/17 0651  WBC 7.6 6.6 5.0 8.1 7.6 9.3 6.6  NEUTROABS 6.4 5.0  --   --   --   --   --   HGB 13.5 13.0 9.3* 12.8 11.7* 11.4* 11.8*  HCT 43.5 41.3 31.7* 41.0 36.2 36.7 37.8  MCV 96.2 95.4 101.3* 97.6 95.0 95.6 96.7  PLT 189 172 124* 127* 144* 193 329   Basic Metabolic Panel: Recent Labs  Lab 12/10/17 2216 12/12/17 0353 12/12/17 1356 12/13/17 0344 12/15/17 0651  NA 133* 138 138 139 140  K 5.2* 5.0 5.1 4.2 4.1  CL 101 104 111 113* 109  CO2 22 23 16* 18* 19*  GLUCOSE 238* 184* 286* 156* 117*  BUN 39* 42* 45* 48* 40*  CREATININE 0.96 1.02* 1.33* 1.07* 0.87  CALCIUM 9.0 8.6* 7.3* 8.1* 8.4*  MG  --   --   --  2.1  --   PHOS  --   --   --  4.3  --    GFR: Estimated Creatinine Clearance: 37.9 mL/min (by C-G formula based on SCr of 0.87 mg/dL). Liver Function Tests: Recent Labs  Lab 12/10/17 2216 12/12/17 1356  AST 28 333*  ALT 26 162*  ALKPHOS 69 145*  BILITOT 1.0 1.0  PROT 5.9* 4.4*  ALBUMIN  3.4* 2.5*   No results for input(s): LIPASE, AMYLASE in the last 168 hours. No results for input(s): AMMONIA in the last 168 hours. Coagulation Profile: Recent Labs  Lab 12/12/17 2353 12/13/17 0344 12/13/17 0756 12/13/17 1447 12/13/17 1817  INR 1.53 1.67 1.55 1.49 1.52   Cardiac Enzymes: Recent Labs  Lab 12/12/17 1356 12/12/17 2119 12/13/17 0344  TROPONINI 0.05* 0.10* 0.14*   BNP (last 3 results) No results for input(s): PROBNP in the last 8760 hours. HbA1C: No results for input(s): HGBA1C in the last 72 hours. CBG: Recent Labs  Lab 12/14/17 2014 12/14/17 2359 12/15/17 0622 12/15/17 0735 12/15/17 1132  GLUCAP 127* 147* 107* 111* 106*   Lipid Profile: No results for input(s): CHOL, HDL, LDLCALC, TRIG, CHOLHDL, LDLDIRECT in the last 72 hours. Thyroid Function Tests: No results for input(s): TSH, T4TOTAL, FREET4, T3FREE, THYROIDAB in the last 72 hours. Anemia Panel: No results for input(s): VITAMINB12, FOLATE, FERRITIN, TIBC, IRON, RETICCTPCT  in the last 72 hours. Urine analysis:    Component Value Date/Time   COLORURINE AMBER (A) 12/11/2017 0110   APPEARANCEUR HAZY (A) 12/11/2017 0110   LABSPEC 1.026 12/11/2017 0110   PHURINE 5.0 12/11/2017 0110   GLUCOSEU 50 (A) 12/11/2017 0110   HGBUR NEGATIVE 12/11/2017 0110   BILIRUBINUR NEGATIVE 12/11/2017 0110   KETONESUR 5 (A) 12/11/2017 0110   PROTEINUR 100 (A) 12/11/2017 0110   UROBILINOGEN 0.2 12/30/2012 2159   NITRITE NEGATIVE 12/11/2017 0110   LEUKOCYTESUR NEGATIVE 12/11/2017 0110   Sepsis Labs: @LABRCNTIP (procalcitonin:4,lacticidven:4)  ) Recent Results (from the past 240 hour(s))  Urine culture     Status: None   Collection Time: 12/11/17  1:10 AM  Result Value Ref Range Status   Specimen Description URINE, CATHETERIZED  Final   Special Requests NONE  Final   Culture   Final    NO GROWTH Performed at [facility], [place] [address] [place], [place] [address]    Report Status 12/12/2017 FINAL   Final  Culture, blood (routine x 2) Call MD if unable to obtain prior to antibiotics being given     Status: None (Preliminary result)   Collection Time: 12/11/17 11:00 AM  Result Value Ref Range Status   Specimen Description BLOOD LEFT ANTECUBITAL  Final   Special Requests   Final    BOTTLES DRAWN AEROBIC AND ANAEROBIC Blood Culture adequate volume   Culture   Final    NO GROWTH 4 DAYS Performed at [facility], [place] [address], [place], [place] [address]    Report Status PENDING  Incomplete  Culture, blood (routine x 2) Call MD if unable to obtain prior to antibiotics being given     Status: None (Preliminary result)   Collection Time: 12/11/17 11:08 AM  Result Value Ref Range Status   Specimen Description BLOOD LEFT WRIST  Final   Special Requests   Final    BOTTLES DRAWN AEROBIC AND ANAEROBIC Blood Culture results may not be optimal due to an inadequate volume of blood received in culture bottles   Culture   Final    NO GROWTH 4 DAYS Performed at [facility], [place] [address], [place], [place] [address]    Report Status PENDING  Incomplete  MRSA PCR Screening     Status: None   Collection Time: 12/11/17 11:31 PM  Result Value Ref Range Status   MRSA by PCR NEGATIVE NEGATIVE Final    Comment:        The GeneXpert MRSA Assay (FDA approved for NASAL specimens only), is one component of a comprehensive MRSA colonization surveillance program. It is not intended to diagnose MRSA infection nor to guide or monitor treatment for MRSA infections. Performed at [facility], [place] [address], [place], [place] [address]       Radiology Studies: Dg Chest Port 1 View  Result Date: 12/15/2017 CLINICAL DATA:  Acute respiratory failure with hypoxia. EXAM: PORTABLE CHEST 1 VIEW COMPARISON:  One-view chest x-ray 12/13/2017.  CT chest 12/12/2017 FINDINGS: Heart is enlarged. Patient has been extubated. NG tube was removed. Aortic atherosclerosis is again seen. Bilateral  pleural effusions and associated airspace disease is stable. Loculated right upper lobe effusion is again noted. IMPRESSION: 1. Interval extubation and removal of NG tube. 2. Stable cardiomegaly and bilateral effusions. A loculated component in the right hemithorax is noted. 3. Bibasilar airspace disease likely reflects atelectasis. Electronically Signed   By: San Morelle M.D.   On:  12/15/2017 07:32     Scheduled Meds: . cefdinir  300 mg Oral Q12H  . furosemide  40 mg Intravenous Once  . furosemide  40 mg Oral Daily  . insulin aspart  0-9 Units Subcutaneous Q4H  . lisinopril  2.5 mg Oral Daily  . mouth rinse  15 mL Mouth Rinse BID  . metoprolol tartrate  12.5 mg Oral BID  . mirtazapine  15 mg Oral QHS  . pantoprazole sodium  40 mg Per Tube Q1200  . QUEtiapine  25 mg Oral QHS   Continuous Infusions: . sodium chloride 10 mL/hr at 12/14/17 1200  . heparin 850 Units/hr (12/15/17 0922)     LOS: 3 days   Time Spent in minutes   30 minutes  Choua Chalker D.O. on 12/15/2017 at 12:26 PM  Between 7am to 7pm - Please see pager noted on [email or web address]  After 7pm go to [email or web address]  And look for the night coverage person covering for me after hours  [facility] Office  [phone]
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
## 2017-12-15 NOTE — Progress Notes (Signed)
ANTICOAGULATION CONSULT NOTE  Pharmacy Consult for heparin Indication: pulmonary embolus   Patient Measurements: Height: 5\' 2"  (157.5 cm) Weight: 110 lb (49.9 kg) IBW/kg (Calculated) : 50.1 Heparin Dosing Weight: 49.9 kg  Vital Signs: BP: 138/94 (11/02 0619) Pulse Rate: 83 (11/02 0619)  Labs: Recent Labs    12/12/17 1356 12/12/17 2119  12/13/17 0344  12/13/17 0756 12/13/17 1447 12/13/17 1817 12/14/17 0325 12/14/17 1258 12/15/17 0651  HGB  --  12.8  --  11.7*  --   --   --   --  11.4*  --  11.8*  HCT  --  41.0  --  36.2  --   --   --   --  36.7  --  37.8  PLT  --  127*  --  144*  --   --   --   --  193  --  180  LABPROT  --  18.8*   < > 19.5*  --  18.4* 17.9* 18.1*  --   --   --   INR  --  1.59   < > 1.67  --  1.55 1.49 1.52  --   --   --   HEPARINUNFRC  --   --   --   --    < > 0.24*  --  0.39 0.25* 0.50 0.56  CREATININE 1.33*  --   --  1.07*  --   --   --   --   --   --   --   TROPONINI 0.05* 0.10*  --  0.14*  --   --   --   --   --   --   --    < > = values in this interval not displayed.    Assessment: 84 year old female s/p respiratory arrest 10/30. Pharmacy has been consulted for heparin dosing in PE. Heparin level is slightly supratherapeutic at 0.56.  Goal of Therapy:  Heparin level 0.3-0.5 units/ml  Monitor platelets by anticoagulation protocol: Yes   Plan:  Decrease heparin IV to 850 units/hr Check 8hr heparin level Daily heparin level, CBC F/u plan for long term anticoagulation   Vertis Kelch, PharmD PGY1 Pharmacy Resident Phone [phone] 12/15/2017       8:45 AM

## 2017-12-15 NOTE — Progress Notes (Signed)
eLink Physician-Brief Progress Note Patient Name: [name] DOB: July 16, 1931 MRN: [id number]   Date of Service  12/15/2017  HPI/Events of Note  Greater than 30 minute discussion with [name], son of patient addressing his concerns regarding lack of nutrition and confusion in his mother 74 with dementia.  I reviewed the results of the swallowing evaluation with Mr. [name] showing that the patient is actively aspirating.  He agreed that this is a safety issue.  He expressed concern about her confusion.  I actively listened and answered questions.  I also pointed out that there is documentation of at least 2 PCCM MDs that discussed her mothers medical condition with him on 10/30 and 10/31.  He did recall these visits.  eICU Interventions  Per our discussion Mr. [name] understands his mother will remain NPO tonight with ice chips.  We will have a safety sitter at the bedside.  Mr [name] is to return to the hospital in the AM to meet with the rounding team.     Intervention Category Major Interventions: Other:  [name] 12/15/2017, 2:08 AM

## 2017-12-15 NOTE — Progress Notes (Signed)
  Speech Language Pathology Treatment: Dysphagia  Patient Details Name: [name] MRN: [id number] DOB: 01/29/1932 Today's Date: 12/15/2017 Time: 8891-6945 SLP Time Calculation (min) (ACUTE ONLY): 25 min  Assessment / Plan / Recommendation Clinical Impression  Patient seen to address dysphagia goals and determine readiness for PO.'s Patient was very alert and able to consume 4 ounces of thin liquids and 3-4 teaspoon bites of puree solids before she started to become fatigued and started to close eyes. Patient did not exhibit any overt s/s of aspiration or penetration, but did exhibit swallow initiation delays as well as cognitive delays in initiating swallow. Based on patient's performance yesterday with SLP, recommend another skilled assessment of diet toleration prior to initiating a diet.    HPI HPI: Pt is an 84 y.o. female with medical history significant for DM; systolic CHF; dementia; HTN; HLD; COPD on home O2; chronic cough; and afib presenting with AMS, fatigue, mild dehydration. CT Head negative (unable to have MRI); CT abdomen showed lower lobe infiltrates L>R. Pt had code blue called on 10/30. CTA Chest revealed bilateral PE. She was intubated 10/30 but self-extubated 10/31.      SLP Plan  Continue with current plan of care       Recommendations  Diet recommendations: NPO(NPO except for meds) Medication Administration: Crushed with puree Compensations: Slow rate;Small sips/bites;Minimize environmental distractions                Oral Care Recommendations: Oral care QID Follow up Recommendations: Other (comment)(TBD) SLP Visit Diagnosis: Dysphagia, unspecified (R13.10) Plan: Continue with current plan of care       [place], MA, CCC-SLP 12/15/17 5:42 PM

## 2017-12-16 DIAGNOSIS — Z515 Encounter for palliative care: Secondary | ICD-10-CM

## 2017-12-16 LAB — CULTURE, BLOOD (ROUTINE X 2)
Culture: NO GROWTH
Culture: NO GROWTH
Special Requests: ADEQUATE

## 2017-12-16 LAB — CBC
HCT: 42.1 % (ref 36.0–46.0)
Hemoglobin: 12.8 g/dL (ref 12.0–15.0)
MCH: 29.4 pg (ref 26.0–34.0)
MCHC: 30.4 g/dL (ref 30.0–36.0)
MCV: 96.8 fL (ref 80.0–100.0)
NRBC: 0.6 % — AB (ref 0.0–0.2)
PLATELETS: 266 10*3/uL (ref 150–400)
RBC: 4.35 MIL/uL (ref 3.87–5.11)
RDW: 15.2 % (ref 11.5–15.5)
WBC: 6.3 10*3/uL (ref 4.0–10.5)

## 2017-12-16 LAB — GLUCOSE, CAPILLARY
GLUCOSE-CAPILLARY: 154 mg/dL — AB (ref 70–99)
GLUCOSE-CAPILLARY: 149 mg/dL — AB (ref 70–99)
GLUCOSE-CAPILLARY: 140 mg/dL — AB (ref 70–99)
GLUCOSE-CAPILLARY: 149 mg/dL — AB (ref 70–99)
Glucose-Capillary: 137 mg/dL — ABNORMAL HIGH (ref 70–99)

## 2017-12-16 LAB — HEPARIN LEVEL (UNFRACTIONATED)
HEPARIN UNFRACTIONATED: 0.36 [IU]/mL (ref 0.30–0.70)
Heparin Unfractionated: 0.16 IU/mL — ABNORMAL LOW (ref 0.30–0.70)

## 2017-12-16 NOTE — Progress Notes (Signed)
ANTICOAGULATION CONSULT NOTE  Pharmacy Consult for heparin Indication: pulmonary embolus  Patient Measurements: Height: 5\' 2"  (157.5 cm) Weight: 110 lb (49.9 kg) IBW/kg (Calculated) : 50.1 Heparin Dosing Weight: 49.9 kg  Vital Signs: Temp: 97.8 F (36.6 C) (11/03 0944) Temp Source: Oral (11/03 0944) BP: 135/99 (11/03 0944) Pulse Rate: 101 (11/03 0944)  Labs: Recent Labs    12/13/17 1447  12/13/17 1817  12/14/17 0325  12/15/17 0651 12/15/17 1656 12/16/17 0539 12/16/17 0851  HGB  --   --   --    < > 11.4*  --  11.8*  --  12.8  --   HCT  --   --   --   --  36.7  --  37.8  --  42.1  --   PLT  --   --   --   --  193  --  180  --  266  --   LABPROT 17.9*  --  18.1*  --   --   --   --   --   --   --   INR 1.49  --  1.52  --   --   --   --   --   --   --   HEPARINUNFRC  --    < > 0.39  --  0.25*   < > 0.56 0.60 0.16* 0.36  CREATININE  --   --   --   --   --   --  0.87  --   --   --    < > = values in this interval not displayed.   Assessment: 51 yoF s/p respiratory arrest 10/30. Pharmacy has been consulted for heparin dosing for PE. Heparin level is therapeutic at 0.36 on heparin 800 units/hr.   Hgb  Improved /wnl., pltc WNL stable. No bleeding noted.  Goal of Therapy:  Heparin level 0.3-0.5 units/ml (for goal aPTT 60-80 sec per MD) Monitor platelets by anticoagulation protocol: Yes   Plan:  Continue heparin gtt to 800 units/hr Daily heparin level and CBC Monitor s/sx of bleeding F/u long term AC plan  Nicole Cella, [place] Clinical Pharmacist Phone: [phone] Please check AMION for all [place] phone numbers After 10:00 PM, call [place] [phone] 12/16/2017       11:15 AM

## 2017-12-16 NOTE — Progress Notes (Signed)
SLP Cancellation Note  Patient Details Name: [name] MRN: [id number] DOB: October 03, 1931   Cancelled treatment:       Reason Eval/Treat Not Completed: Patient declined, no reason specified  Gabriel Rainwater MA, CCC-SLP   [name] 12/16/2017, 1:41 PM

## 2017-12-16 NOTE — Progress Notes (Signed)
Daily Progress Note   Patient Name: [name]       Date: 12/16/2017 DOB: [birth year]  Age: 84 y.o. MRN#: [id number] Attending Physician: Cristal Ford, DO Primary Care Physician: Aura Dials, MD Admit Date: 12/10/2017  Reason for Consultation/Follow-up: Establishing goals of care  Subjective: Patient awake and pleasantly confused.  She carries on a short conversations of pleasantries with me.  She is NPO per speech.  I attempted to call her son on the phone.  He did not answer and his voice mail is full.  I spoke to Dr. Ree Kida.  Patient has received maximal therapy without significant improvement due to her frailty, advanced heart failure (EF 10 - 15%), and advanced dementia.   Currently patient is NPO per speech, but no decision has been made regarding feeding.  Speech is going to revisit today.   Assessment: Patient is Hospice eligible (EF 10%).  Son apparently is difficult to communicate with - he wants to meet in person with providers.   Patient Profile/HPI:  84 y.o. female  with past medical history of dementia, COPD, CHF, afib, CVA, HLD, anxiety, loop recorder admitted on 12/10/2017 with altered mental status and fatigue. Admitted for acute encephalopathy secondary to CAP with underlying dementia. Patient suffered respiratory arrest requiring intubation. Self-extubated. CTA positive for bilateral PE's without right heart strain, on heparin gtt. ECHO revealed severe left ventricular dysfunction with EF 10-15%. Patient is not a candidate for aggressive cardiac interventions and will be medically managed. Declining functional, cognitive, and nutritional status.    Length of Stay: 4  Current Medications: Scheduled Meds:  . furosemide  20 mg Intravenous Daily  . insulin  aspart  0-9 Units Subcutaneous Q4H  . lisinopril  2.5 mg Oral Daily  . mouth rinse  15 mL Mouth Rinse BID  . metoprolol tartrate  2.5 mg Intravenous Q6H  . mirtazapine  15 mg Oral QHS  . pantoprazole sodium  40 mg Per Tube Q1200    Continuous Infusions: . sodium chloride 10 mL/hr at 12/14/17 1200  . cefTRIAXone (ROCEPHIN)  IV 1 g (12/15/17 1805)  . heparin 800 Units/hr (12/15/17 1801)    PRN Meds: acetaminophen, albuterol, labetalol, ondansetron (ZOFRAN) IV, polyethylene glycol, QUEtiapine  Physical Exam       Elderly very frail pleasantly demented female.  Lying comfortably in  bed. CV tachy resp no distress - on 10 L? Abdomen thin soft, nt  Vital Signs: BP (!) 135/99   Pulse (!) 101   Temp 97.8 F (36.6 C) (Oral)   Resp 18   Ht 5\' 2"  (1.575 m)   Wt 49.9 kg   SpO2 96%   BMI 20.12 kg/m  SpO2: SpO2: 96 % O2 Device: O2 Device: Room Air O2 Flow Rate: O2 Flow Rate (L/min): 10 L/min  Intake/output summary:   Intake/Output Summary (Last 24 hours) at 12/16/2017 1311 Last data filed at 12/16/2017 8099 Gross per 24 hour  Intake 861.22 ml  Output 1675 ml  Net -813.78 ml   LBM: Last BM Date: 12/12/17 Baseline Weight: Weight: 49.9 kg Most recent weight: Weight: 49.9 kg       Palliative Assessment/Data:  20%    Flowsheet Rows     Most Recent Value  Intake Tab  Referral Department  Hospitalist  Unit at Time of Referral  Med/Surg Unit  Palliative Care Primary Diagnosis  -- Lewanda Rife, CHF]  Palliative Care Type  New Palliative care  Reason for referral  Clarify Goals of Care  Clinical Assessment  Palliative Performance Scale Score  20%  Psychosocial & Spiritual Assessment  Palliative Care Outcomes  Patient/Family meeting held?  Yes  Who was at the meeting?  son  Palliative Care Outcomes  Clarified goals of care, Provided psychosocial or spiritual support, ACP counseling assistance, Provided end of life care assistance      Patient Active Problem List   Diagnosis  Date Noted  . Palliative care by specialist   . Acute respiratory failure ([place])   . Decompensated heart failure ([place])   . Acute encephalopathy 12/11/2017  . Dehydration 12/11/2017  . Abdominal pain 12/11/2017  . Dementia without behavioral disturbance ([place]) 12/11/2017  . CAP (community acquired pneumonia) 12/11/2017  . Pain in the chest   . Orthostatic hypotension 03/04/2014  . Chest pressure 12/30/2013  . Goals of care, counseling/discussion 12/30/2013  . Nasal polyp 05/15/2013  . Shortness of breath 12/30/2012  . Anxiety 08/28/2011  . Atrial fibrillation with RVR ([place]) 08/28/2011  . Prerenal acute renal failure ([place]) 08/28/2011  . Nausea and vomiting 08/28/2011  . COPD exacerbation ([place]) 03/03/2011  . Hyponatremia 03/03/2011  . Hypotension 03/03/2011  . Lung nodule 03/03/2011  . Confusion 01/01/2011  . Diabetes mellitus ([place]) 01/01/2011  . LV dysfunction 12/27/2010  . Neuralgia 12/24/2010  . SVT (supraventricular tachycardia) ([place]) 12/24/2010  . HTN (hypertension) 12/24/2010  . CHEST PAIN 12/30/2008  . SINUSITIS, CHRONIC 04/02/2007  . AODM 03/24/2007  . COPD mixed type ([place]) 03/20/2007  . SYNCOPE AND COLLAPSE 03/20/2007  . Seasonal and perennial allergic rhinitis 03/19/2007    Palliative Care Plan    Recommendations/Plan:  Patient is hospice eligible for services at home.  May be eligible for [facility] I am uncertain with out more information.  Appreciate speech re-evaluation  PMT will continue to follow reassessing patient and attempting to communicate with family.  Goals of Care and Additional Recommendations:  Limitations on Scope of Treatment: Full Scope Treatment  Code Status:  DNR  Prognosis:   at high risk for acute decline or death.  EF 10-15%, eating / drinking minimal amounts, unable to stand walk  Discharge Planning:  To Be Determined  Recommend home with hospice   Care plan was discussed with Dr. Ree Kida.  Thank you for allowing the  Palliative Medicine Team to assist in the care of this patient.  Total  time spent:  35 min.     Greater than 50%  of this time was spent counseling and coordinating care related to the above assessment and plan.  Florentina Jenny, PA-C Palliative Medicine  Please contact Palliative MedicineTeam phone at [phone] for questions and concerns between 7 am - 7 pm.   Please see AMION for individual provider pager numbers.

## 2017-12-16 NOTE — Progress Notes (Addendum)
PROGRESS NOTE    [name]  SEG:[id number] DOB: 08-02-1931 DOA: 12/10/2017 PCP: Aura Dials, MD   Brief Narrative:  HPI On 12/11/2017 by Dr. Karmen Bongo [name] is a 84 y.o. female with medical history significant of DM; systolic CHF; dementia; HTN; HLD; COPD on home O2; and afib presenting with AMS and fatigue.  Her son was not present at the time of my evaluation and the patient was not able to provide an effective history.  She reported not feeling well but really was unable to be more specific than that.  Interim history Admitted for acute encephalopathy secondary to community-acquired pneumonia with superimposed on baseline dementia.  Patient suffered respiratory arrest and required intubation.  CTA showed positive bilateral PEs without CT evidence of right heart strain.  Patient was extubated on 12/12/2017.  Patient transferred out of ICU and to [facility] service on 12/15/2017.  Of note patient was found to have presumptive diagnosis of heart failure and was diuresed.  She currently remains on room air.  Palliative care has also been consulted.  Assessment & Plan   Acute hypoxemic respiratory failure -Improving, patient did require intubation and self extubated on 12/12/2017 -Suspect hypoxia and respiratory failure pulmonary embolism, CHF and possible community-acquired pneumonia (continue Omnicef) -Treat underlying causes  Bilateral pulmonary embolism -CTA chest showed bilateral pulmonary emboli, greatest in the right middle lobe branches.  Large pleural effusions with associated atelectasis, right effusion is likely loculated. -Lower extremity Doppler: Right, no DVT.  Left, intermediate DVT involving left femoral, popliteal, posterior tibial, left peroneal veins. -Patient currently on heparin, plan is to possibly transition to Eliquis-however given patient's dementia and baseline, wonder if this is an appropriate treatment  Acute systolic CHF  exacerbation -Echocardiogram EF of 10 to 15%, diffuse hypokinesis.  PA peak pressure 49 mmHg.  Large left pleural effusion.  Moderate TR -Continue Lasix (placed on IV form), lisinopril, metoprolol -Monitor daily weights, intake and output  Atrial fibrillation -Appears to be rate controlled -Continue metoprolol and currently on heparin  Acute metabolic encephalopathy superimposed on dementia -Suspect secondary to hypoxia and respiratory failure -CT head showed no acute intracranial finding x2 -Continue Remeron, Seroquel added for sundowning  Dysphagia? -Patient appears to be at risk for dysphasia, pending speech evaluation  Goals of care and CODE STATUS -Patient currently DNR -Palliative care consulted and appreciated, pending further conversations  DVT Prophylaxis brain  Code Status: DNR  Family Communication: None at bedside. Son via phone. Son is very upset at the care that she has received thus particularly in the ICU. Had long discussion with son for about 30 minutes on the phone at which point he continued to bash the hospital, certain personnel, and the care she has been receiving.   Disposition Plan: Admitted.  Pending further palliative care discussions.  Consultants PCCM Palliative care  Procedures  Echocardiogram Lower extremity Doppler  Antibiotics   Anti-infectives (From admission, onward)   Start     Dose/Rate Route Frequency Ordered Stop   12/15/17 1800  cefTRIAXone (ROCEPHIN) 1 g in sodium chloride 0.9 % 100 mL IVPB     1 g 200 mL/hr over 30 Minutes Intravenous Every 24 hours 12/15/17 1727     12/15/17 0900  cefdinir (OMNICEF) capsule 300 mg  Status:  Discontinued     300 mg Oral Every 12 hours 12/14/17 1331 12/15/17 1727   12/11/17 1000  cefTRIAXone (ROCEPHIN) 1 g in sodium chloride 0.9 % 100 mL IVPB  Status:  Discontinued  1 g 200 mL/hr over 30 Minutes Intravenous Every 24 hours 12/11/17 0936 12/14/17 1331   12/11/17 1000  azithromycin (ZITHROMAX)  500 mg in sodium chloride 0.9 % 250 mL IVPB  Status:  Discontinued     500 mg 250 mL/hr over 60 Minutes Intravenous Every 24 hours 12/11/17 0936 12/13/17 0940      Subjective:   [name] seen and examined today.  Has no complaints this morning. Denies current chest pain, shortness of breath.   Objective:   Vitals:   12/15/17 2021 12/16/17 0055 12/16/17 0427 12/16/17 0944  BP: (!) 150/98 (!) 140/108 127/90 (!) 135/99  Pulse: (!) 116 93 (!) 117 (!) 101  Resp: 18  15 18   Temp: 98.2 F (36.8 C)  97.7 F (36.5 C) 97.8 F (36.6 C)  TempSrc: Oral  Oral Oral  SpO2: 96% 94% 94% 96%  Weight:      Height:        Intake/Output Summary (Last 24 hours) at 12/16/2017 1131 Last data filed at 12/16/2017 0616 Gross per 24 hour  Intake 861.22 ml  Output 1675 ml  Net -813.78 ml   Filed Weights   12/10/17 2207  Weight: 49.9 kg   Exam  General: Well developed, chronically ill appearing, NAD  HEENT: NCAT, mucous membranes dry  Neck: Supple  Cardiovascular: S1 S2 auscultated, RRR  Respiratory: Clear to auscultation bilaterally with equal chest rise  Abdomen: Soft, nontender, nondistended, + bowel sounds  Extremities: warm dry without cyanosis clubbing. LE B/L edema  Neuro: AAOx1 (self only), dementia at baseline  Psych: Appropriate mood and affect, pleasant   Data Reviewed: I have personally reviewed following labs and imaging studies  CBC: Recent Labs  Lab 12/10/17 2216 12/12/17 0353  12/12/17 2119 12/13/17 0344 12/14/17 0325 12/15/17 0651 12/16/17 0539  WBC 7.6 6.6   < > 8.1 7.6 9.3 6.6 6.3  NEUTROABS 6.4 5.0  --   --   --   --   --   --   HGB 13.5 13.0   < > 12.8 11.7* 11.4* 11.8* 12.8  HCT 43.5 41.3   < > 41.0 36.2 36.7 37.8 42.1  MCV 96.2 95.4   < > 97.6 95.0 95.6 96.7 96.8  PLT 189 172   < > 127* 144* 193 180 266   < > = values in this interval not displayed.   Basic Metabolic Panel: Recent Labs  Lab 12/10/17 2216 12/12/17 0353 12/12/17 1356  12/13/17 0344 12/15/17 0651  NA 133* 138 138 139 140  K 5.2* 5.0 5.1 4.2 4.1  CL 101 104 111 113* 109  CO2 22 23 16* 18* 19*  GLUCOSE 238* 184* 286* 156* 117*  BUN 39* 42* 45* 48* 40*  CREATININE 0.96 1.02* 1.33* 1.07* 0.87  CALCIUM 9.0 8.6* 7.3* 8.1* 8.4*  MG  --   --   --  2.1  --   PHOS  --   --   --  4.3  --    GFR: Estimated Creatinine Clearance: 37.9 mL/min (by C-G formula based on SCr of 0.87 mg/dL). Liver Function Tests: Recent Labs  Lab 12/10/17 2216 12/12/17 1356  AST 28 333*  ALT 26 162*  ALKPHOS 69 145*  BILITOT 1.0 1.0  PROT 5.9* 4.4*  ALBUMIN 3.4* 2.5*   No results for input(s): LIPASE, AMYLASE in the last 168 hours. No results for input(s): AMMONIA in the last 168 hours. Coagulation Profile: Recent Labs  Lab 12/12/17 2353 12/13/17 0344 12/13/17  5638 12/13/17 1447 12/13/17 1817  INR 1.53 1.67 1.55 1.49 1.52   Cardiac Enzymes: Recent Labs  Lab 12/12/17 1356 12/12/17 2119 12/13/17 0344  TROPONINI 0.05* 0.10* 0.14*   BNP (last 3 results) No results for input(s): PROBNP in the last 8760 hours. HbA1C: No results for input(s): HGBA1C in the last 72 hours. CBG: Recent Labs  Lab 12/15/17 1623 12/15/17 2022 12/15/17 2329 12/16/17 0421 12/16/17 0711  GLUCAP 130* 174* 144* 154* 149*   Lipid Profile: No results for input(s): CHOL, HDL, LDLCALC, TRIG, CHOLHDL, LDLDIRECT in the last 72 hours. Thyroid Function Tests: No results for input(s): TSH, T4TOTAL, FREET4, T3FREE, THYROIDAB in the last 72 hours. Anemia Panel: No results for input(s): VITAMINB12, FOLATE, FERRITIN, TIBC, IRON, RETICCTPCT in the last 72 hours. Urine analysis:    Component Value Date/Time   COLORURINE AMBER (A) 12/11/2017 0110   APPEARANCEUR HAZY (A) 12/11/2017 0110   LABSPEC 1.026 12/11/2017 0110   PHURINE 5.0 12/11/2017 0110   GLUCOSEU 50 (A) 12/11/2017 0110   HGBUR NEGATIVE 12/11/2017 0110   BILIRUBINUR NEGATIVE 12/11/2017 0110   KETONESUR 5 (A) 12/11/2017 0110    PROTEINUR 100 (A) 12/11/2017 0110   UROBILINOGEN 0.2 12/30/2012 2159   NITRITE NEGATIVE 12/11/2017 0110   LEUKOCYTESUR NEGATIVE 12/11/2017 0110   Sepsis Labs: @LABRCNTIP (procalcitonin:4,lacticidven:4)  ) Recent Results (from the past 240 hour(s))  Urine culture     Status: None   Collection Time: 12/11/17  1:10 AM  Result Value Ref Range Status   Specimen Description URINE, CATHETERIZED  Final   Special Requests NONE  Final   Culture   Final    NO GROWTH Performed at [facility], [place] [address], [place], [place] [address]    Report Status 12/12/2017 FINAL  Final  Culture, blood (routine x 2) Call MD if unable to obtain prior to antibiotics being given     Status: None (Preliminary result)   Collection Time: 12/11/17 11:00 AM  Result Value Ref Range Status   Specimen Description BLOOD LEFT ANTECUBITAL  Final   Special Requests   Final    BOTTLES DRAWN AEROBIC AND ANAEROBIC Blood Culture adequate volume   Culture   Final    NO GROWTH 4 DAYS Performed at [facility], [place] [address], [place], [place] [address]    Report Status PENDING  Incomplete  Culture, blood (routine x 2) Call MD if unable to obtain prior to antibiotics being given     Status: None (Preliminary result)   Collection Time: 12/11/17 11:08 AM  Result Value Ref Range Status   Specimen Description BLOOD LEFT WRIST  Final   Special Requests   Final    BOTTLES DRAWN AEROBIC AND ANAEROBIC Blood Culture results may not be optimal due to an inadequate volume of blood received in culture bottles   Culture   Final    NO GROWTH 4 DAYS Performed at [facility], [place] [address], [place], [place] [address]    Report Status PENDING  Incomplete  MRSA PCR Screening     Status: None   Collection Time: 12/11/17 11:31 PM  Result Value Ref Range Status   MRSA by PCR NEGATIVE NEGATIVE Final    Comment:        The GeneXpert MRSA Assay (FDA approved for NASAL specimens only), is one component of  a comprehensive MRSA colonization surveillance program. It is not intended to diagnose MRSA infection nor to guide or monitor treatment for MRSA infections. Performed at  [facility], [place] [address] [place],  [address]       Radiology Studies: Dg Chest Port 1 View  Result Date: 12/15/2017 CLINICAL DATA:  Acute respiratory failure with hypoxia. EXAM: PORTABLE CHEST 1 VIEW COMPARISON:  One-view chest x-ray 12/13/2017.  CT chest 12/12/2017 FINDINGS: Heart is enlarged. Patient has been extubated. NG tube was removed. Aortic atherosclerosis is again seen. Bilateral pleural effusions and associated airspace disease is stable. Loculated right upper lobe effusion is again noted. IMPRESSION: 1. Interval extubation and removal of NG tube. 2. Stable cardiomegaly and bilateral effusions. A loculated component in the right hemithorax is noted. 3. Bibasilar airspace disease likely reflects atelectasis. Electronically Signed   By: San Morelle M.D.   On: 12/15/2017 07:32     Scheduled Meds: . furosemide  20 mg Intravenous Daily  . insulin aspart  0-9 Units Subcutaneous Q4H  . lisinopril  2.5 mg Oral Daily  . mouth rinse  15 mL Mouth Rinse BID  . metoprolol tartrate  2.5 mg Intravenous Q6H  . mirtazapine  15 mg Oral QHS  . pantoprazole sodium  40 mg Per Tube Q1200   Continuous Infusions: . sodium chloride 10 mL/hr at 12/14/17 1200  . cefTRIAXone (ROCEPHIN)  IV 1 g (12/15/17 1805)  . heparin 800 Units/hr (12/15/17 1801)     LOS: 4 days   Time Spent in minutes   30 minutes  [name] D.O. on 12/16/2017 at 11:31 AM  Between 7am to 7pm - Please see pager noted on [email or web address]  After 7pm go to [email or web address]  And look for the night coverage person covering for me after hours  [facility] Office  [phone]

## 2017-12-17 DIAGNOSIS — E860 Dehydration: Secondary | ICD-10-CM

## 2017-12-17 DIAGNOSIS — J189 Pneumonia, unspecified organism: Secondary | ICD-10-CM

## 2017-12-17 DIAGNOSIS — Z7189 Other specified counseling: Secondary | ICD-10-CM

## 2017-12-17 DIAGNOSIS — I509 Heart failure, unspecified: Secondary | ICD-10-CM

## 2017-12-17 DIAGNOSIS — J9601 Acute respiratory failure with hypoxia: Secondary | ICD-10-CM

## 2017-12-17 DIAGNOSIS — F0150 Vascular dementia without behavioral disturbance: Secondary | ICD-10-CM

## 2017-12-17 DIAGNOSIS — Z515 Encounter for palliative care: Secondary | ICD-10-CM

## 2017-12-17 LAB — CBC
HCT: 45.1 % (ref 36.0–46.0)
Hemoglobin: 13.8 g/dL (ref 12.0–15.0)
MCH: 29.8 pg (ref 26.0–34.0)
MCHC: 30.6 g/dL (ref 30.0–36.0)
MCV: 97.4 fL (ref 80.0–100.0)
NRBC: 0.4 % — AB (ref 0.0–0.2)
PLATELETS: 249 10*3/uL (ref 150–400)
RBC: 4.63 MIL/uL (ref 3.87–5.11)
RDW: 15.3 % (ref 11.5–15.5)
WBC: 7.2 10*3/uL (ref 4.0–10.5)

## 2017-12-17 LAB — GLUCOSE, CAPILLARY
GLUCOSE-CAPILLARY: 145 mg/dL — AB (ref 70–99)
GLUCOSE-CAPILLARY: 146 mg/dL — AB (ref 70–99)
GLUCOSE-CAPILLARY: 128 mg/dL — AB (ref 70–99)
Glucose-Capillary: 167 mg/dL — ABNORMAL HIGH (ref 70–99)
Glucose-Capillary: 159 mg/dL — ABNORMAL HIGH (ref 70–99)
Glucose-Capillary: 123 mg/dL — ABNORMAL HIGH (ref 70–99)

## 2017-12-17 LAB — HEPARIN LEVEL (UNFRACTIONATED): Heparin Unfractionated: 0.23 IU/mL — ABNORMAL LOW (ref 0.30–0.70)

## 2017-12-17 MED ORDER — GLYCOPYRROLATE 0.2 MG/ML IJ SOLN: 0.2000 mg | INTRAMUSCULAR | Status: DC | PRN

## 2017-12-17 MED ORDER — HALOPERIDOL 0.5 MG PO TABS
0.5000 mg | ORAL_TABLET | ORAL | Status: DC | PRN
  Filled 2017-12-17: qty 1

## 2017-12-17 MED ORDER — SODIUM CHLORIDE 0.9% FLUSH: 3.0000 mL | Freq: Two times a day (BID) | INTRAVENOUS | Status: DC

## 2017-12-17 MED ORDER — SODIUM CHLORIDE 0.9 % IV SOLN: 250.0000 mL | INTRAVENOUS | Status: DC | PRN

## 2017-12-17 MED ORDER — MORPHINE SULFATE (PF) 2 MG/ML IV SOLN: 1.0000 mg | INTRAVENOUS | Status: DC | PRN

## 2017-12-17 MED ORDER — MORPHINE SULFATE (PF) 2 MG/ML IV SOLN
1.0000 mg | INTRAVENOUS | Status: DC | PRN
  Administered 2017-12-17: 2 mg via INTRAVENOUS
  Filled 2017-12-17: qty 1

## 2017-12-17 MED ORDER — POLYVINYL ALCOHOL 1.4 % OP SOLN
1.0000 [drp] | Freq: Four times a day (QID) | OPHTHALMIC | Status: DC | PRN
  Filled 2017-12-17: qty 15

## 2017-12-17 MED ORDER — HALOPERIDOL LACTATE 2 MG/ML PO CONC
0.5000 mg | ORAL | Status: DC | PRN
  Filled 2017-12-17: qty 0.3

## 2017-12-17 MED ORDER — GLYCOPYRROLATE 0.2 MG/ML IJ SOLN
0.2000 mg | INTRAMUSCULAR | Status: DC | PRN
  Administered 2017-12-17: 0.2 mg via INTRAVENOUS
  Filled 2017-12-17: qty 1

## 2017-12-17 MED ORDER — SODIUM CHLORIDE 0.9% FLUSH: 3.0000 mL | INTRAVENOUS | Status: DC | PRN

## 2017-12-17 MED ORDER — GLYCOPYRROLATE 1 MG PO TABS: 1.0000 mg | ORAL_TABLET | ORAL | Status: DC | PRN

## 2017-12-17 MED ORDER — BIOTENE DRY MOUTH MT LIQD: 15.0000 mL | OROMUCOSAL | Status: DC | PRN

## 2017-12-17 MED ORDER — HALOPERIDOL LACTATE 5 MG/ML IJ SOLN: 0.5000 mg | INTRAMUSCULAR | Status: DC | PRN

## 2017-12-17 NOTE — Clinical Social Work Note (Addendum)
CSW received consult from Palliative PA Haynes Dage for a hospice facility, as patient is now comfort care. Per Haynes Dage, family wants [facility]. Call made to SW liaison Erling Conte and CSW advised that no beds at this time and they have a waiting list. CSW visited with patient's family at the bedside and updated them regarding no bed availability at [facility] at this time. Son talked with patient's nieces who were also at the bedside and [facility] chosen. Referral made to [facility] and requested clinicals transmitted to [place]. CSW will continue to follow and facilitate discharge to a Hospice facility once a bed is available.  Patient discharging to [facility] this evening, transported by ambulance. Son and patient's niece at the bedside. Discharge clinicals transmitted to facility and ambulance transport arranged. CSW signing off as no other SW intervention services needed.  Nolin Grell Givens, MSW, LCSW Licensed Clinical Social Worker [place] [phone]

## 2017-12-17 NOTE — Evaluation (Signed)
Occupational Therapy Evaluation Patient Details Name: [name] MRN: [id number] DOB: Oct 07, 1931 Today's Date: 12/17/2017    History of Present Illness 84yo female with baseline dementia who presented to the ED with AMS and fatigue, unable to provide significant history other than "not feeling good". PMH A-fib, chronic chest pain, COPD, hx DVT, HTN, hx of home O2 use (not currently on O2 per son), syncope, DM, cardiac cath, knee surgery, lumbar surgery, RCR    Clinical Impression   Per chart review, pt was living with her son and was independent with ADLs and using RW for functional mobility PTA. Pt currently requiring Mod A for UB ADLs, Mod-Max A for LB ADLs, and Mod A +2 for functional transfers with RW. Pt requiring Max cues for ADLs and participation. Pt reporting "I want to go home" throughout session. Pt would benefit from further acute OT to facilitate safe dc. Recommend dc to SNF for further OT to optimize safety, independence with ADLs, and return to PLOF.       Follow Up Recommendations  SNF;Supervision/Assistance - 24 hour    Equipment Recommendations  Other (comment)(Defer to next venue)    Recommendations for Other Services PT consult     Precautions / Restrictions Precautions Precautions: Fall Restrictions Weight Bearing Restrictions: No      Mobility Bed Mobility Overal bed mobility: Needs Assistance Bed Mobility: Supine to Sit     Supine to sit: Max assist;+2 for safety/equipment;HOB elevated     General bed mobility comments: Max A to bring BLEs toward EOB and then elevate trunk. Using [facility] elevated to support. Pt requiring Max cues and encouragement to participate  Transfers Overall transfer level: Needs assistance Equipment used: Rolling walker (2 wheeled) Transfers: Sit to/from [facility] Sit to Stand: Mod assist;+2 safety/equipment Stand pivot transfers: Min assist;+2 safety/equipment       General transfer comment: Mod A to  power up and then gain balance. Using RW. Min A for balance during pivot    Balance Overall balance assessment: Needs assistance Sitting-balance support: Bilateral upper extremity supported;Feet supported Sitting balance-Leahy Scale: Fair     Standing balance support: Bilateral upper extremity supported;During functional activity Standing balance-Leahy Scale: Poor                             ADL either performed or assessed with clinical judgement   ADL Overall ADL's : Needs assistance/impaired Eating/Feeding: Minimal assistance;Sitting;Cueing for sequencing;Cueing for safety   Grooming: Minimal assistance;Sitting;Cueing for sequencing;Cueing for safety   Upper Body Bathing: Moderate assistance;Sitting   Lower Body Bathing: Moderate assistance;Sit to/from stand   Upper Body Dressing : Moderate assistance;Sitting   Lower Body Dressing: Maximal assistance;Sit to/from stand Lower Body Dressing Details (indicate cue type and reason): donned socks at bed level Toilet Transfer: Moderate assistance;+2 for safety/equipment;Stand-pivot;RW(Simulated to recliner)           Functional mobility during ADLs: Moderate assistance;+2 for safety/equipment;Rolling walker;Cueing for sequencing;Cueing for safety General ADL Comments: Requring Max cues throughout for ADL performance     Vision         Perception     Praxis      Pertinent Vitals/Pain Pain Assessment: Faces Faces Pain Scale: Hurts little more Pain Location: BLEs Pain Descriptors / Indicators: Grimacing Pain Intervention(s): Monitored during session;Limited activity within patient's tolerance;Repositioned     Hand Dominance Right   Extremity/Trunk Assessment Upper Extremity Assessment Upper Extremity Assessment: Generalized weakness   Lower Extremity  Assessment Lower Extremity Assessment: Generalized weakness   Cervical / Trunk Assessment Cervical / Trunk Assessment: Kyphotic   Communication  Communication Communication: No difficulties   Cognition Arousal/Alertness: Awake/alert Behavior During Therapy: Impulsive;Agitated Overall Cognitive Status: Impaired/Different from baseline Area of Impairment: Orientation;Attention;Memory;Following commands;Safety/judgement;Awareness;Problem solving                 Orientation Level: Disoriented to;Place;Time;Situation Current Attention Level: Sustained Memory: Decreased short-term memory Following Commands: Follows one step commands inconsistently;Follows one step commands with increased time Safety/Judgement: Decreased awareness of safety;Decreased awareness of deficits Awareness: Intellectual Problem Solving: Slow processing;Decreased initiation;Difficulty sequencing;Requires verbal cues General Comments: history of dementia at baseline. Requiring increased time and cues. Perseverating on "I want to go home"   General Comments  RN present throughout session    Exercises     Shoulder Instructions      Home Living Family/patient expects to be discharged to:: Private residence Living Arrangements: Children Available Help at Discharge: Family;Available 24 hours/day(lives with one of her sons) Type of Home: House Home Access: Stairs to enter [employer] of Steps: 3 Entrance Stairs-Rails: Can reach both Home Layout: One level     Bathroom Shower/Tub: Tub/shower unit;Curtain   Biochemist, clinical: Standard     Home Equipment: Environmental consultant - 2 wheels;Shower seat   Additional Comments: Home information collected from chart review of PT evaluation. No family present to confirm at OT eval      Prior Functioning/Environment Level of Independence: Independent with assistive device(s)        Comments: Information collected form chart review. per son report at baseline is very independent with RW        OT Problem List: Decreased strength;Decreased range of motion;Decreased activity tolerance;Impaired balance  (sitting and/or standing);Decreased cognition;Decreased safety awareness;Decreased knowledge of use of DME or AE;Decreased knowledge of precautions;Pain      OT Treatment/Interventions: Self-care/ADL training;Therapeutic exercise;Energy conservation;DME and/or AE instruction;Therapeutic activities;Patient/family education    OT Goals(Current goals can be found in the care plan section) Acute Rehab OT Goals Patient Stated Goal: "I want to go home" OT Goal Formulation: With patient Time For Goal Achievement: 12/31/17 Potential to Achieve Goals: Good  OT Frequency: Min 2X/week   Barriers to D/C:            Co-evaluation              AM-PAC PT "6 Clicks" Daily Activity     Outcome Measure Help from another person eating meals?: A Little Help from another person taking care of personal grooming?: A Little Help from another person toileting, which includes using toliet, bedpan, or urinal?: A Lot Help from another person bathing (including washing, rinsing, drying)?: A Lot Help from another person to put on and taking off regular upper body clothing?: A Lot Help from another person to put on and taking off regular lower body clothing?: A Lot 6 Click Score: 14   End of Session Equipment Utilized During Treatment: Surveyor, mining Communication: Mobility status  Activity Tolerance: Patient limited by fatigue Patient left: in chair;with call bell/phone within reach;with chair alarm set  OT Visit Diagnosis: Unsteadiness on feet (R26.81);Other abnormalities of gait and mobility (R26.89);Muscle weakness (generalized) (M62.81);Other symptoms and signs involving cognitive function;Pain Pain - part of body: Leg                Time: 1000-1020 OT Time Calculation (min): 20 min Charges:  OT General Charges $OT Visit: 1 Visit OT Evaluation $OT Eval Moderate Complexity: 1 Mod  [place], OTR/L Acute Rehab Pager: [phone] Office: [place] 12/17/2017, 11:13 AM

## 2017-12-17 NOTE — Progress Notes (Signed)
Daily Progress Note   Patient Name: [name]       Date: 12/17/2017 DOB: 04/02/1931  Age: 84 y.o. MRN#: [id number] Attending Physician: Cristal Ford, DO Primary Care Physician: Aura Dials, MD Admit Date: 12/10/2017  Reason for Consultation/Follow-up: Establishing goals of care and Psychosocial/spiritual support  Subjective: Met with patient, son, and his two cousins Jana Half and [place].  Had a long conversation about patient's life and sons involvement.  Discussed current health status.  Son is beginning to accept that his mother is in the process of dying.  She sleeps 90% of the time.  She has stopped eating.  During this hospitalization she has not been out of bed or walked.  Discussed taking his mother home to die vs going to [facility].  Son becomes emotional and breaks down into tears.  I can't handle her dying at home.  We talked some more about [facility].  He cried multiple times and asked "Am I doing the right thing?"   I attempted to provide re-assurance and encouragement.   Assessment: 86 patient bed bound, refusing to eat, EF 10%, COPD, now with bilateral PEs and extensive DVT.  Has large pleural effusions and is requiring a significant amount of oxygen.   Patient Profile/HPI:  84 y.o.femalewith past medical history of dementia, COPD, CHF, afib, CVA, HLD, anxiety, loop recorderadmitted on10/28/2019with altered mental status and fatigue.Admitted for acute encephalopathy secondary to CAP with underlying dementia. Patient suffered respiratory arrest requiring intubation. Self-extubated. CTA positive for bilateral PE's without right heart strain, on heparin gtt. ECHO revealed severe left ventricular dysfunction with EF 10-15%. Patient is not a candidate for  aggressive cardiac interventions and will be medically managed. Declining functional, cognitive, and nutritional status.  Length of Stay: 5  Current Medications: Scheduled Meds:  . furosemide  20 mg Intravenous Daily  . lisinopril  2.5 mg Oral Daily  . mouth rinse  15 mL Mouth Rinse BID  . mirtazapine  15 mg Oral QHS  . pantoprazole sodium  40 mg Per Tube Q1200  . sodium chloride flush  3 mL Intravenous Q12H    Continuous Infusions: . sodium chloride 10 mL/hr at 12/14/17 1200  . sodium chloride      PRN Meds: sodium chloride, acetaminophen, albuterol, antiseptic oral rinse, glycopyrrolate **OR** glycopyrrolate **OR** glycopyrrolate, haloperidol **OR**  haloperidol **OR** haloperidol lactate, morphine injection, ondansetron (ZOFRAN) IV, polyethylene glycol, polyvinyl alcohol, QUEtiapine, sodium chloride flush  Physical Exam       Frail, elderly female, confused. CV irreg irreg with sys murmur Resp no distress on 10L (?) Abdomen soft, thin, NT  Vital Signs: BP (!) 151/106 (BP Location: Left Arm)   Pulse 81   Temp (!) 97.5 F (36.4 C) (Oral)   Resp 18   Ht 5' 2" (1.575 m)   Wt 49.9 kg   SpO2 95%   BMI 20.12 kg/m  SpO2: SpO2: 95 % O2 Device: O2 Device: Room Air O2 Flow Rate: O2 Flow Rate (L/min): 10 L/min  Intake/output summary:   Intake/Output Summary (Last 24 hours) at 12/17/2017 1113 Last data filed at 12/17/2017 0402 Gross per 24 hour  Intake 0 ml  Output 900 ml  Net -900 ml   LBM: Last BM Date: 12/12/17 Baseline Weight: Weight: 49.9 kg Most recent weight: Weight: 49.9 kg       Palliative Assessment/Data: 20%    Flowsheet Rows     Most Recent Value  Intake Tab  Referral Department  Hospitalist  Unit at Time of Referral  Med/Surg Unit  Palliative Care Primary Diagnosis  -- [dementia, CHF]  Palliative Care Type  New Palliative care  Reason for referral  Clarify Goals of Care  Clinical Assessment  Palliative Performance Scale Score  20%  Psychosocial &  Spiritual Assessment  Palliative Care Outcomes  Patient/Family meeting held?  Yes  Who was at the meeting?  son  Palliative Care Outcomes  Clarified goals of care, Provided psychosocial or spiritual support, ACP counseling assistance, Provided end of life care assistance      Patient Active Problem List   Diagnosis Date Noted  . Palliative care encounter   . Palliative care by specialist   . Acute respiratory failure (HCC)   . Decompensated heart failure (HCC)   . Acute encephalopathy 12/11/2017  . Dehydration 12/11/2017  . Abdominal pain 12/11/2017  . Dementia without behavioral disturbance (HCC) 12/11/2017  . CAP (community acquired pneumonia) 12/11/2017  . Pain in the chest   . Orthostatic hypotension 03/04/2014  . Chest pressure 12/30/2013  . Goals of care, counseling/discussion 12/30/2013  . Nasal polyp 05/15/2013  . Shortness of breath 12/30/2012  . Anxiety 08/28/2011  . Atrial fibrillation with RVR (HCC) 08/28/2011  . Prerenal acute renal failure (HCC) 08/28/2011  . Nausea and vomiting 08/28/2011  . COPD exacerbation (HCC) 03/03/2011  . Hyponatremia 03/03/2011  . Hypotension 03/03/2011  . Lung nodule 03/03/2011  . Confusion 01/01/2011  . Diabetes mellitus (HCC) 01/01/2011  . LV dysfunction 12/27/2010  . Neuralgia 12/24/2010  . SVT (supraventricular tachycardia) (HCC) 12/24/2010  . HTN (hypertension) 12/24/2010  . CHEST PAIN 12/30/2008  . SINUSITIS, CHRONIC 04/02/2007  . AODM 03/24/2007  . COPD mixed type (HCC) 03/20/2007  . SYNCOPE AND COLLAPSE 03/20/2007  . Seasonal and perennial allergic rhinitis 03/19/2007    Palliative Care Plan    Recommendations/Plan:  Shift to total comfort.  Family requests patient d/c to [facility] as soon is bed is available.  PMT will continue to follow while she is in the hospital.  Goals of Care and Additional Recommendations:  Limitations on Scope of Treatment: Full Comfort Care  Code Status:   DNR  Prognosis:  Less than 2 weeks.  Bilateral PEs and DVT with large pleural effusions and EF of 10%.  No longer eating/drinking or sitting up in bed.  Now full comfort   measures.  Discharge Planning:  Hospice facility  Care plan was discussed with MD, RN, Family.  Thank you for allowing the Palliative Medicine Team to assist in the care of this patient.  Total time spent:  60 min.     Greater than 50%  of this time was spent counseling and coordinating care related to the above assessment and plan.  Florentina Jenny, PA-C Palliative Medicine  Please contact Palliative MedicineTeam phone at [phone] for questions and concerns between 7 am - 7 pm.   Please see AMION for individual provider pager numbers.     ]

## 2017-12-17 NOTE — Care Management Important Message (Signed)
Important Message  Patient Details  Name: [name] MRN: [id number] Date of Birth: Jun 16, 1931   Medicare Important Message Given:  Yes    [name] [name] 12/17/2017, 3:48 PM

## 2017-12-17 NOTE — Progress Notes (Signed)
Patient discharged to [facility] via [facility]. Patient's belongings with son. Son aware that he has to sign paper works at the facility. Report already given by day RN. Jarvis Knodel, Wonda Cheng, Therapist, sports

## 2017-12-17 NOTE — Progress Notes (Signed)
ANTICOAGULATION CONSULT NOTE  Pharmacy Consult for heparin Indication: pulmonary embolus  Patient Measurements: Height: 5\' 2"  (157.5 cm) Weight: 110 lb (49.9 kg) IBW/kg (Calculated) : 50.1 Heparin Dosing Weight: 49.9 kg  Vital Signs: Temp: 97.5 F (36.4 C) (11/04 0755) Temp Source: Oral (11/04 0755) BP: 151/106 (11/04 0755) Pulse Rate: 81 (11/04 0755)  Labs: Recent Labs    12/15/17 0651  12/16/17 0539 12/16/17 0851 12/17/17 0529 12/17/17 0532  HGB 11.8*  --  12.8  --   --  13.8  HCT 37.8  --  42.1  --   --  45.1  PLT 180  --  266  --   --  249  HEPARINUNFRC 0.56   < > 0.16* 0.36 0.23*  --   CREATININE 0.87  --   --   --   --   --    < > = values in this interval not displayed.   Assessment: 66 yoF s/p respiratory arrest 10/30. Pharmacy has been consulted for heparin dosing for PE.   Heparin level this morning is slightly SUBtherapeutic (HL 0.23 << 0.36, goal of 0.3-0.7). CBC wnl - no bleeding noted. No issues with the line reported per RN - will increase slightly.  Goal of Therapy:  Heparin level 0.3-0.5 units/ml (for goal aPTT 60-80 sec per MD) Monitor platelets by anticoagulation protocol: Yes   Plan:  Increase Heparin slightly to 850 units/hr (8.5 ml/hr) Daily heparin level and CBC Monitor s/sx of bleeding F/u long term AC plan  Thank you for allowing pharmacy to be a part of this patient's care.  Alycia Rossetti, PharmD, BCPS Clinical Pharmacist Pager: [phone] Clinical phone for 12/17/2017 from 7a-3:30p: [phone] If after 3:30p, please call main pharmacy at: [phone] Please check AMION for all [place] numbers 12/17/2017 10:04 AM

## 2017-12-17 NOTE — Progress Notes (Signed)
  Speech Language Pathology Treatment: Dysphagia  Patient Details Name: [name] MRN: [id number] DOB: [birth year] Today's Date: 12/17/2017 Time: 4287-6811 SLP Time Calculation (min) (ACUTE ONLY): 17 min  Assessment / Plan / Recommendation Clinical Impression  Pt was awake and alert however confused throughout dysphagia treatment session. Assessment was limited due to pt's reluctance to consume POs, however given max verbal and tactile cues she sustained attention to trials of thin and puree. Due to level of cueing and encouragement required to consume puree, regular not attempted. No overt s/s aspiration were observed, however pt's son reported overall decreased appetite. SLP educated pt and family regarding aspiration precautions and recommendations to order foods that can be easily managed and are most appealing to pt to encourage greatest intake. In order to provide pt/family with greater variety of choices for meals, ST reommends regular diet, thin liquids, minimize environmental distractions, take small bites and sips. ST will follow up to provide treatment with diet safety and efficiency as well as further pt/family education.     HPI HPI: Pt is an 84 y.o. female with medical history significant for DM; systolic CHF; dementia; HTN; HLD; COPD on home O2; chronic cough; and afib presenting with AMS, fatigue, mild dehydration. CT Head negative (unable to have MRI); CT abdomen showed lower lobe infiltrates L>R. Pt had code blue called on 10/30. CTA Chest revealed bilateral PE. She was intubated 10/30 but self-extubated 10/31.      SLP Plan  Continue with current plan of care       Recommendations  Diet recommendations: Regular;Thin liquid Liquids provided via: Cup;Straw Medication Administration: Whole meds with puree Supervision: Staff to assist with self feeding Compensations: Slow rate;Small sips/bites;Minimize environmental distractions Postural Changes and/or Swallow Maneuvers:  Seated upright 90 degrees                Oral Care Recommendations: Oral care BID Follow up Recommendations: None SLP Visit Diagnosis: Dysphagia, unspecified (R13.10) Plan: Continue with current plan of care       Jettie Booze, Student SLP                 Jettie Booze 12/17/2017, 10:16 AM

## 2017-12-17 NOTE — Progress Notes (Signed)
70M [facility] - [facility]  - RN Note  Received patient referral for [facility]. Spoke with Lorriane Shire (LCSW) who was made aware that unfortunately, [facility] has no beds to offer at this time. Did not initiate contact with patient or family as Lorriane Shire (LCSW) will now make referral to another [facility] per patient's family wishes.   Thank you for referral,  Gar Ponto, [facility] Liaison [place] found on AMION

## 2017-12-17 NOTE — Progress Notes (Signed)
PROGRESS NOTE    [name]  HYW:[id number] DOB: [birth year] DOA: 12/10/2017 PCP: Aura Dials, MD   Brief Narrative:  HPI On 12/11/2017 by Dr. Karmen Bongo [name] is a 84 y.o. female with medical history significant of DM; systolic CHF; dementia; HTN; HLD; COPD on home O2; and afib presenting with AMS and fatigue.  Her son was not present at the time of my evaluation and the patient was not able to provide an effective history.  She reported not feeling well but really was unable to be more specific than that.  Interim history Admitted for acute encephalopathy secondary to community-acquired pneumonia with superimposed on baseline dementia.  Patient suffered respiratory arrest and required intubation.  CTA showed positive bilateral PEs without CT evidence of right heart strain.  Patient was extubated on 12/12/2017.  Patient transferred out of ICU and to [facility] service on 12/15/2017.  Of note patient was found to have presumptive diagnosis of heart failure and was diuresed.  She currently remains on room air.  Palliative care has also been consulted, plan for residential hospice.   Assessment & Plan   Acute hypoxemic respiratory failure -Improving, patient did require intubation and self extubated on 12/12/2017 -Suspect hypoxia and respiratory failure pulmonary embolism, CHF and possible community-acquired pneumonia (continue Omnicef) -Treat underlying causes  Bilateral pulmonary embolism -CTA chest showed bilateral pulmonary emboli, greatest in the right middle lobe branches.  Large pleural effusions with associated atelectasis, right effusion is likely loculated. -Lower extremity Doppler: Right, no DVT.  Left, intermediate DVT involving left femoral, popliteal, posterior tibial, left peroneal veins. -Patient currently on heparin, plan is to possibly transition to Eliquis-however given patient's dementia and baseline, wonder if this is an appropriate treatment  Acute systolic  CHF exacerbation -Echocardiogram EF of 10 to 15%, diffuse hypokinesis.  PA peak pressure 49 mmHg.  Large left pleural effusion.  Moderate TR -Continue Lasix (placed on IV form), lisinopril, metoprolol -Monitor daily weights, intake and output  Atrial fibrillation -Appears to be rate controlled -Continue metoprolol and currently on heparin  Acute metabolic encephalopathy superimposed on dementia -Suspect secondary to hypoxia and respiratory failure -CT head showed no acute intracranial finding x2 -Continue Remeron, Seroquel added for sundowning  Dysphagia? -Patient appears to be at risk for dysphasia, pending speech evaluation  Goals of care and CODE STATUS -Patient currently DNR -Palliative care consulted and appreciated, plan for residential hospice  DVT Prophylaxis brain  Code Status: DNR  Family Communication: None at bedside. Son via phone on 12/16/2017. Son is very upset at the care that she has received thus particularly in the ICU. Had long discussion with son for about 30 minutes on the phone at which point he continued to bash the hospital, certain personnel, and the care she has been receiving.   Disposition Plan: Admitted.  Pending further palliative care discussions.  Consultants PCCM Palliative care  Procedures  Echocardiogram Lower extremity Doppler  Antibiotics   Anti-infectives (From admission, onward)   Start     Dose/Rate Route Frequency Ordered Stop   12/15/17 1800  cefTRIAXone (ROCEPHIN) 1 g in sodium chloride 0.9 % 100 mL IVPB  Status:  Discontinued     1 g 200 mL/hr over 30 Minutes Intravenous Every 24 hours 12/15/17 1727 12/17/17 1102   12/15/17 0900  cefdinir (OMNICEF) capsule 300 mg  Status:  Discontinued     300 mg Oral Every 12 hours 12/14/17 1331 12/15/17 1727   12/11/17 1000  cefTRIAXone (ROCEPHIN) 1 g in sodium  chloride 0.9 % 100 mL IVPB  Status:  Discontinued     1 g 200 mL/hr over 30 Minutes Intravenous Every 24 hours 12/11/17 0936  12/14/17 1331   12/11/17 1000  azithromycin (ZITHROMAX) 500 mg in sodium chloride 0.9 % 250 mL IVPB  Status:  Discontinued     500 mg 250 mL/hr over 60 Minutes Intravenous Every 24 hours 12/11/17 0936 12/13/17 0940      Subjective:   Shylah Engebretsen seen and examined today.  Patient has no complaints.  Patient with dementia.  Very pleasant.  Denies current chest pain, shortness of breath, abdominal pain, nausea or vomiting. Would like to drink something.  Objective:   Vitals:   12/16/17 1700 12/16/17 2034 12/17/17 0409 12/17/17 0755  BP: 128/90 (!) 141/108 129/89 (!) 151/106  Pulse: (!) 102 (!) 126 92 81  Resp: 18 18  18   Temp: 98 F (36.7 C) 97.8 F (36.6 C) 98.4 F (36.9 C) (!) 97.5 F (36.4 C)  TempSrc: Oral Oral Oral Oral  SpO2: 98% 93% 97% 95%  Weight:      Height:        Intake/Output Summary (Last 24 hours) at 12/17/2017 1345 Last data filed at 12/17/2017 1100 Gross per 24 hour  Intake 120 ml  Output 1400 ml  Net -1280 ml   Filed Weights   12/10/17 2207  Weight: 49.9 kg   Exam  General: Well developed, chronically ill appearing, NAD  HEENT: NCAT, mucous membranes dry  Neck: Supple  Cardiovascular: S1 S2 auscultated, RRR  Respiratory: Clear to auscultation bilaterally with equal chest rise  Abdomen: Soft, nontender, nondistended, + bowel sounds  Extremities: warm dry without cyanosis clubbing. LE edema B/L  Neuro: AAOx1 (self only), dementia at baselin  Psych: Appropriate mood and affect, pleasant   Data Reviewed: I have personally reviewed following labs and imaging studies  CBC: Recent Labs  Lab 12/10/17 2216 12/12/17 0353  12/13/17 0344 12/14/17 0325 12/15/17 0651 12/16/17 0539 12/17/17 0532  WBC 7.6 6.6   < > 7.6 9.3 6.6 6.3 7.2  NEUTROABS 6.4 5.0  --   --   --   --   --   --   HGB 13.5 13.0   < > 11.7* 11.4* 11.8* 12.8 13.8  HCT 43.5 41.3   < > 36.2 36.7 37.8 42.1 45.1  MCV 96.2 95.4   < > 95.0 95.6 96.7 96.8 97.4  PLT 189 172   < >  144* 193 180 266 249   < > = values in this interval not displayed.   Basic Metabolic Panel: Recent Labs  Lab 12/10/17 2216 12/12/17 0353 12/12/17 1356 12/13/17 0344 12/15/17 0651  NA 133* 138 138 139 140  K 5.2* 5.0 5.1 4.2 4.1  CL 101 104 111 113* 109  CO2 22 23 16* 18* 19*  GLUCOSE 238* 184* 286* 156* 117*  BUN 39* 42* 45* 48* 40*  CREATININE 0.96 1.02* 1.33* 1.07* 0.87  CALCIUM 9.0 8.6* 7.3* 8.1* 8.4*  MG  --   --   --  2.1  --   PHOS  --   --   --  4.3  --    GFR: Estimated Creatinine Clearance: 37.9 mL/min (by C-G formula based on SCr of 0.87 mg/dL). Liver Function Tests: Recent Labs  Lab 12/10/17 2216 12/12/17 1356  AST 28 333*  ALT 26 162*  ALKPHOS 69 145*  BILITOT 1.0 1.0  PROT 5.9* 4.4*  ALBUMIN 3.4* 2.5*  No results for input(s): LIPASE, AMYLASE in the last 168 hours. No results for input(s): AMMONIA in the last 168 hours. Coagulation Profile: Recent Labs  Lab 12/12/17 2353 12/13/17 0344 12/13/17 0756 12/13/17 1447 12/13/17 1817  INR 1.53 1.67 1.55 1.49 1.52   Cardiac Enzymes: Recent Labs  Lab 12/12/17 1356 12/12/17 2119 12/13/17 0344  TROPONINI 0.05* 0.10* 0.14*   BNP (last 3 results) No results for input(s): PROBNP in the last 8760 hours. HbA1C: No results for input(s): HGBA1C in the last 72 hours. CBG: Recent Labs  Lab 12/16/17 1131 12/16/17 1604 12/16/17 2033 12/17/17 0756 12/17/17 1157  GLUCAP 140* 137* 149* 145* 146*   Lipid Profile: No results for input(s): CHOL, HDL, LDLCALC, TRIG, CHOLHDL, LDLDIRECT in the last 72 hours. Thyroid Function Tests: No results for input(s): TSH, T4TOTAL, FREET4, T3FREE, THYROIDAB in the last 72 hours. Anemia Panel: No results for input(s): VITAMINB12, FOLATE, FERRITIN, TIBC, IRON, RETICCTPCT in the last 72 hours. Urine analysis:    Component Value Date/Time   COLORURINE AMBER (A) 12/11/2017 0110   APPEARANCEUR HAZY (A) 12/11/2017 0110   LABSPEC 1.026 12/11/2017 0110   PHURINE 5.0  12/11/2017 0110   GLUCOSEU 50 (A) 12/11/2017 0110   HGBUR NEGATIVE 12/11/2017 0110   BILIRUBINUR NEGATIVE 12/11/2017 0110   KETONESUR 5 (A) 12/11/2017 0110   PROTEINUR 100 (A) 12/11/2017 0110   UROBILINOGEN 0.2 12/30/2012 2159   NITRITE NEGATIVE 12/11/2017 0110   LEUKOCYTESUR NEGATIVE 12/11/2017 0110   Sepsis Labs: @LABRCNTIP (procalcitonin:4,lacticidven:4)  ) Recent Results (from the past 240 hour(s))  Urine culture     Status: None   Collection Time: 12/11/17  1:10 AM  Result Value Ref Range Status   Specimen Description URINE, CATHETERIZED  Final   Special Requests NONE  Final   Culture   Final    NO GROWTH Performed at [facility], [place] [address], [place], [place] [address]    Report Status 12/12/2017 FINAL  Final  Culture, blood (routine x 2) Call MD if unable to obtain prior to antibiotics being given     Status: None   Collection Time: 12/11/17 11:00 AM  Result Value Ref Range Status   Specimen Description BLOOD LEFT ANTECUBITAL  Final   Special Requests   Final    BOTTLES DRAWN AEROBIC AND ANAEROBIC Blood Culture adequate volume   Culture   Final    NO GROWTH 5 DAYS Performed at [facility], [place] [address], [place], [place] [address]    Report Status 12/16/2017 FINAL  Final  Culture, blood (routine x 2) Call MD if unable to obtain prior to antibiotics being given     Status: None   Collection Time: 12/11/17 11:08 AM  Result Value Ref Range Status   Specimen Description BLOOD LEFT WRIST  Final   Special Requests   Final    BOTTLES DRAWN AEROBIC AND ANAEROBIC Blood Culture results may not be optimal due to an inadequate volume of blood received in culture bottles   Culture   Final    NO GROWTH 5 DAYS Performed at [facility], [place] [address], [place], [place] [address]    Report Status 12/16/2017 FINAL  Final  MRSA PCR Screening     Status: None   Collection Time: 12/11/17 11:31 PM  Result Value Ref Range Status   MRSA by PCR NEGATIVE  NEGATIVE Final    Comment:        The GeneXpert MRSA Assay (FDA approved for NASAL specimens  only), is one component of a comprehensive MRSA colonization surveillance program. It is not intended to diagnose MRSA infection nor to guide or monitor treatment for MRSA infections. Performed at [facility], [place] [address] [place], [place] [address]       Radiology Studies: No results found.   Scheduled Meds: . furosemide  20 mg Intravenous Daily  . lisinopril  2.5 mg Oral Daily  . mouth rinse  15 mL Mouth Rinse BID  . mirtazapine  15 mg Oral QHS  . pantoprazole sodium  40 mg Per Tube Q1200  . sodium chloride flush  3 mL Intravenous Q12H   Continuous Infusions: . sodium chloride 10 mL/hr at 12/14/17 1200  . sodium chloride       LOS: 5 days   Time Spent in minutes   25 minutes  Shikira Folino D.O. on 12/17/2017 at 1:45 PM  Between 7am to 7pm - Please see pager noted on [email or web address]  After 7pm go to [email or web address]  And look for the night coverage person covering for me after hours  [facility] Office  [phone]

## 2017-12-17 NOTE — Discharge Summary (Addendum)
Physician Discharge Summary  [name] WRU:[id number] DOB: June 05, 1931 DOA: 12/10/2017  PCP: Aura Dials, MD  Admit date: 12/10/2017 Discharge date: 12/17/2017  Time spent: 45 minutes  Recommendations for Outpatient Follow-up:  Patient will be discharged to [facility].    Discharge Diagnoses:  Acute hypoxemic respiratory failure Bilateral pulmonary embolism Acute systolic CHF exacerbation Atrial fibrillation Acute metabolic encephalopathy superimposed on dementia Dysphagia? Goals of care and CODE STATUS  Discharge Condition: Stable  Diet recommendation: comfort  Filed Weights   12/10/17 2207  Weight: 49.9 kg    History of present illness:  On 12/11/2017 by Dr. Karmen Bongo [name] a 84 y.o.femalewith medical history significant ofDM; systolic CHF; dementia; HTN; HLD; COPD on home O2; and afib presenting with AMS and fatigue.Her son was not present at the time of my evaluation and the patient was not able to provide an effective history. She reported not feeling well but really was unable to be more specific than that.  Hospital Course:  Interim history Admitted for acute encephalopathy secondary to community-acquired pneumonia with superimposed on baseline dementia.  Patient suffered respiratory arrest and required intubation.  CTA showed positive bilateral PEs without CT evidence of right heart strain.  Patient was extubated on 12/12/2017.  Patient transferred out of ICU and to [facility] service on 12/15/2017.  Of note patient was found to have presumptive diagnosis of heart failure and was diuresed.  She currently remains on room air.  Palliative care has also been consulted, plan for residential hospice.   Acute hypoxemic respiratory failure -Improving, patient did require intubation and self extubated on 12/12/2017 -Suspect hypoxia and respiratory failure pulmonary embolism, CHF and possible community-acquired pneumonia -completed antibiotics  for possible pneumonia  -Treated underlying causes  Bilateral pulmonary embolism -CTA chest showed bilateral pulmonary emboli, greatest in the right middle lobe branches.  Large pleural effusions with associated atelectasis, right effusion is likely loculated. -Lower extremity Doppler: Right, no DVT.  Left, intermediate DVT involving left femoral, popliteal, posterior tibial, left peroneal veins. -was placed on heparin- made more comfort measures and will be discharged to hospice facility- no further anticoagulation  Acute systolic CHF exacerbation -Echocardiogram EF of 10 to 15%, diffuse hypokinesis.  PA peak pressure 49 mmHg.  Large left pleural effusion.  Moderate TR -Continue Lasix (placed on IV form), IV metoprolol -unable to take lisinopril -Monitor daily weights, intake and output  Atrial fibrillation -Appears to be rate controlled -Continue metoprolol and currently on heparin  Acute metabolic encephalopathy superimposed on dementia -Suspect secondary to hypoxia and respiratory failure -CT head showed no acute intracranial finding x2 -Continue Remeron, Seroquel added for sundowning  Dysphagia? -Patient appears to be at risk for dysphasia, pending speech evaluation  Goals of care and CODE STATUS -Patient currently DNR -Palliative care consulted and appreciated, plan for residential hospice -was placed on comfort meds:  Haldol, robinul, morphine   Code status: DNR  Consultants PCCM Palliative care  Procedures  Echocardiogram Lower extremity Doppler   Discharge Exam: Vitals:   12/17/17 0409 12/17/17 0755  BP: 129/89 (!) 151/106  Pulse: 92 81  Resp:  18  Temp: 98.4 F (36.9 C) (!) 97.5 F (36.4 C)  SpO2: 97% 95%     General: Well developed, chronically ill appearing, NAD  HEENT: NCAT, mucous membranes dry  Cardiovascular: S1 S2 auscultated, RRR  Respiratory: Clear to auscultation bilaterally with equal chest rise  Abdomen: Soft, nontender,  nondistended, + bowel sounds  Extremities: warm dry without cyanosis clubbing. LE edema  B/L   Neuro: AAOx31 (self only), dementia, nonfocal  Psych: appropriate mood and affect, pleasant  Discharge Instructions  Allergies as of 12/17/2017      Reactions   Epinephrine Other (See Comments)   Patient thinks her heart stopped and the MD said never to take this again.   Ivp Dye [iodinated Diagnostic Agents] Other (See Comments)   Unknown-patient said she thinks her heart stopped, but she really can't remember   Asa [aspirin] Other (See Comments)   Heart doctor told her to take advil instead      Medication List    STOP taking these medications   metoprolol tartrate 25 MG tablet Commonly known as:  LOPRESSOR   mirtazapine 15 MG tablet Commonly known as:  REMERON   multivitamin with minerals Tabs tablet      Allergies  Allergen Reactions  . Epinephrine Other (See Comments)    Patient thinks her heart stopped and the MD said never to take this again.  Clementeen Hoof [Iodinated Diagnostic Agents] Other (See Comments)    Unknown-patient said she thinks her heart stopped, but she really can't remember  . Asa [Aspirin] Other (See Comments)    Heart doctor told her to take advil instead      The results of significant diagnostics from this hospitalization (including imaging, microbiology, ancillary and laboratory) are listed below for reference.    Significant Diagnostic Studies: Ct Abdomen Pelvis Wo Contrast  Result Date: 12/11/2017 CLINICAL DATA:  Altered mental status and fatigue EXAM: CT ABDOMEN AND PELVIS WITHOUT CONTRAST TECHNIQUE: Multidetector CT imaging of the abdomen and pelvis was performed following the standard protocol without IV contrast. COMPARISON:  05/04/2015 FINDINGS: Lower chest: Lung bases demonstrate evidence of bilateral pleural effusions right greater than left. Associated right lower lobe infiltrate is noted. Some patchy infiltrative changes are noted in the  right lower lobe as well. Cardiomegaly is noted with coronary calcifications. Hepatobiliary: Some dependent density is noted within the gallbladder likely representing sludge. No definitive stones are seen. The liver is within normal limits. Pancreas: Unremarkable. No pancreatic ductal dilatation or surrounding inflammatory changes. Spleen: Normal in size without focal abnormality. Adrenals/Urinary Tract: Adrenal glands are within normal limits. No renal calculi or obstructive changes are seen. The ureters are within normal limits. The bladder is partially distended. Stomach/Bowel: Scattered diverticular changes noted within the colon. No evidence of diverticulitis is seen. The appendix has been surgically removed no other obstructive or inflammatory changes are seen. Vascular/Lymphatic: Aortic atherosclerosis. No enlarged abdominal or pelvic lymph nodes. Reproductive: Status post hysterectomy. No adnexal masses. Other: Minimal free fluid is noted within the pelvis of uncertain significance. Mild changes suggesting anasarca are noted as well. Musculoskeletal: Degenerative changes of the lumbar spine are noted. No compression deformities are seen. IMPRESSION: Bilateral pleural effusions and lower lobe infiltrative changes left worse than right. Minimal ascites and mild changes of anasarca. Diverticulosis without diverticulitis. Electronically Signed   By: Inez Catalina M.D.   On: 12/11/2017 08:57   Ct Head Wo Contrast  Result Date: 12/12/2017 CLINICAL DATA:  Altered level of consciousness. Respiratory arrest. Assess for stroke. History of atrial fibrillation, hypertension, hypercholesterolemia, diabetes. EXAM: CT HEAD WITHOUT CONTRAST TECHNIQUE: Contiguous axial images were obtained from the base of the skull through the vertex without intravenous contrast. COMPARISON:  CT HEAD December 11, 2017 and CT HEAD Jun 16, 2017. FINDINGS: BRAIN: No intraparenchymal hemorrhage, mass effect nor midline shift. The ventricles  and sulci are normal for age. Patchy supratentorial white matter  hypodensities. Old basal ganglia lacunar infarcts. No acute large vascular territory infarcts. No abnormal extra-axial fluid collections. Similar prominent RIGHT anterior cranial fossa extra-axial space. Basal cisterns are patent. VASCULAR: Mild calcific atherosclerosis of the carotid siphons. SKULL: No skull fracture.  Small RIGHT parietal scalp hematoma. SINUSES/ORBITS: Lobulated paranasal sinus mucosal thickening with air-fluid levels. Mastoid air cells are well aerated.The included ocular globes and orbital contents are non-suspicious. OTHER: None. IMPRESSION: 1. No acute intracranial process. 2. Stable examination including moderate chronic small vessel ischemic changes and old lacunar infarcts. Electronically Signed   By: Elon Alas M.D.   On: 12/12/2017 20:26   Ct Head Wo Contrast  Result Date: 12/11/2017 CLINICAL DATA:  84 year old female with altered mental status. EXAM: CT HEAD WITHOUT CONTRAST TECHNIQUE: Contiguous axial images were obtained from the base of the skull through the vertex without intravenous contrast. COMPARISON:  Head CT dated 06/16/2016 FINDINGS: Brain: There is moderate atrophy and chronic microvascular ischemic changes. There is no acute intracranial hemorrhage. No mass effect or midline shift. An 8 mm CSF density fluid in the subdural spaces over the right frontal lobe likely related to an old hematoma or chronic trauma. Vascular: No hyperdense vessel or unexpected calcification. Skull: Normal. Negative for fracture or focal lesion. Sinuses/Orbits: Mild mucoperiosteal thickening of paranasal sinuses. Bilateral maxillary sinus air-fluid levels as well as air-fluid level within the right sphenoid sinus. The mastoid air cells are clear. Other: None IMPRESSION: 1. No acute intracranial hemorrhage. 2. Moderate atrophy and chronic microvascular ischemic changes. 3. Paranasal sinus disease. Electronically Signed    By: Anner Crete M.D.   On: 12/11/2017 04:43   Ct Angio Chest Pe W Or Wo Contrast  Result Date: 12/12/2017 CLINICAL DATA:  Respiratory failure.  Negative DVT study. EXAM: CT ANGIOGRAPHY CHEST WITH CONTRAST TECHNIQUE: Multidetector CT imaging of the chest was performed using the standard protocol during bolus administration of intravenous contrast. Multiplanar CT image reconstructions and MIPs were obtained to evaluate the vascular anatomy. CONTRAST:  64mL ISOVUE-370 IOPAMIDOL (ISOVUE-370) INJECTION 76% COMPARISON:  Chest CT 03/03/2011 and chest radiograph 12/12/2017 FINDINGS: Cardiovascular: --Pulmonary arteries: Contrast injection is sufficient to demonstrate satisfactory opacification of the pulmonary arteries to the segmental level.There are bilateral pulmonary emboli. The greatest burden is in the proximal right middle lobar branches. There are smaller embolic burns in the right upper and lower lobe segmental branches. On the left, there is a small filling defect within the artery of the lingula. The main pulmonary artery is within normal limits for size. --Aorta: Limited opacification of the aorta due to bolus timing optimization for the pulmonary arteries. The aortic course and caliber are normal. There is mild aortic atherosclerosis. --Heart: Normal size. No pericardial effusion. Mediastinum/Nodes: No mediastinal, hilar or axillary lymphadenopathy. The visualized thyroid and thoracic esophageal course are unremarkable. Lungs/Pleura: Endotracheal tube tip is 1.8 cm above the inferior margin of the carina. There are large pleural effusions with associated atelectasis. There is a right lateral loculated component. Upper Abdomen: Contrast bolus timing is not optimized for evaluation of the abdominal organs. Within this limitation, the visualized organs of the upper abdomen are normal. Musculoskeletal: No chest wall abnormality. No acute or significant osseous findings. Review of the MIP images confirms  the above findings. IMPRESSION: 1. Bilateral pulmonary emboli, greatest in the right middle lobe branches, but also affecting the right upper and lower lobes and the lingula. 2. No CT evidence of right heart strain. 3. Large pleural effusions with associated atelectasis. The right effusion is likely  loculated. 4.  Aortic Atherosclerosis (ICD10-I70.0). Critical Value/emergent results were called by telephone at the time of interpretation on 12/12/2017 at 9:06 pm to Dr. Genevive Bi , who verbally acknowledged these results. Electronically Signed   By: Ulyses Jarred M.D.   On: 12/12/2017 21:08   Dg Chest Port 1 View  Result Date: 12/15/2017 CLINICAL DATA:  Acute respiratory failure with hypoxia. EXAM: PORTABLE CHEST 1 VIEW COMPARISON:  One-view chest x-ray 12/13/2017.  CT chest 12/12/2017 FINDINGS: Heart is enlarged. Patient has been extubated. NG tube was removed. Aortic atherosclerosis is again seen. Bilateral pleural effusions and associated airspace disease is stable. Loculated right upper lobe effusion is again noted. IMPRESSION: 1. Interval extubation and removal of NG tube. 2. Stable cardiomegaly and bilateral effusions. A loculated component in the right hemithorax is noted. 3. Bibasilar airspace disease likely reflects atelectasis. Electronically Signed   By: San Morelle M.D.   On: 12/15/2017 07:32   Portable Chest Xray  Result Date: 12/13/2017 CLINICAL DATA:  84 year old female with respiratory failure. EXAM: PORTABLE CHEST 1 VIEW COMPARISON:  Chest CT dated 12/12/2017 FINDINGS: Endotracheal tube approximately 2 cm above the carina. Recommend retraction by 2-3 cm for optimal positioning. Enteric tube extends below the diaphragm with tip beyond the inferior margin of the image. Bilateral pleural effusions with loculated component in the upper lobes as seen on the chest radiograph. Associated atelectatic changes of the adjacent lungs. No pneumothorax. There is cardiomegaly. No acute osseous  pathology. IMPRESSION: 1. Endotracheal tube approximately 2 cm above the carina. Recommend retraction by 2-3 cm for optimal positioning. 2. Bilateral pleural effusions and associated compressive atelectasis of the adjacent lungs similar to prior CT. Electronically Signed   By: Anner Crete M.D.   On: 12/13/2017 05:27   Dg Chest Port 1v Same Day  Result Date: 12/12/2017 CLINICAL DATA:  Intubated. Shortness of breath. EXAM: PORTABLE CHEST 1 VIEW COMPARISON:  03/04/2014. FINDINGS: Endotracheal tube tip 2.3 cm above the carina. Nasogastric tube extending into the stomach. Interval enlarged cardiac silhouette, small to moderate-sized bilateral pleural effusions and bibasilar airspace opacity, greater on the left. Interval minimal prominence of the interstitial markings. Interval mild lobulated soft tissue density overlying the right upper lung zone laterally. Thoracic spine degenerative changes. Old, healed left clavicle fracture. IMPRESSION: 1. Interval changes of congestive heart failure with cardiomegaly, bilateral pleural effusions, bibasilar atelectasis and minimal interstitial pulmonary edema. 2. Dense left lower lobe atelectasis or pneumonia. 3. Lobulated soft tissue density overlying the right upper lung zone laterally. This could represent overlying soft tissues or partially loculated pleural fluid. 4. The endotracheal tube tip is located 2.3 cm above the carina. This could be retracted 2 cm. Electronically Signed   By: Claudie Revering M.D.   On: 12/12/2017 12:41   Vas [place] Lower Extremity Venous (dvt)  Result Date: 12/13/2017  Lower Venous Study Indications: Edema, and Swelling.  Performing Technologist: Abram Sander  Examination Guidelines: A complete evaluation includes B-mode imaging, spectral Doppler, color Doppler, and power Doppler as needed of all accessible portions of each vessel. Bilateral testing is considered an integral part of a complete examination. Limited examinations for reoccurring  indications may be performed as noted.  Right Venous Findings: +---------+---------------+---------+-----------+----------+-------+          CompressibilityPhasicitySpontaneityPropertiesSummary +---------+---------------+---------+-----------+----------+-------+ CFV      Full           Yes      Yes                          +---------+---------------+---------+-----------+----------+-------+  SFJ      Full                                                 +---------+---------------+---------+-----------+----------+-------+ FV Prox  Full                                                 +---------+---------------+---------+-----------+----------+-------+ FV Mid   Full                                                 +---------+---------------+---------+-----------+----------+-------+ FV DistalFull                                                 +---------+---------------+---------+-----------+----------+-------+ PFV      Full                                                 +---------+---------------+---------+-----------+----------+-------+ POP      Full           Yes      Yes                          +---------+---------------+---------+-----------+----------+-------+ PTV      Full                                                 +---------+---------------+---------+-----------+----------+-------+ PERO     Full                                                 +---------+---------------+---------+-----------+----------+-------+  Left Venous Findings: +---------+---------------+---------+-----------+----------+-----------------+          CompressibilityPhasicitySpontaneityPropertiesSummary           +---------+---------------+---------+-----------+----------+-----------------+ CFV      Full           Yes      Yes                                    +---------+---------------+---------+-----------+----------+-----------------+ SFJ      Full                                                            +---------+---------------+---------+-----------+----------+-----------------+ FV Prox  Partial                                                        +---------+---------------+---------+-----------+----------+-----------------+  FV Mid   None                                         Age Indeterminate +---------+---------------+---------+-----------+----------+-----------------+ FV DistalNone                                         Age Indeterminate +---------+---------------+---------+-----------+----------+-----------------+ PFV      Full                                         Age Indeterminate +---------+---------------+---------+-----------+----------+-----------------+ POP      None           No       No                   Age Indeterminate +---------+---------------+---------+-----------+----------+-----------------+ PTV      None                                                           +---------+---------------+---------+-----------+----------+-----------------+ PERO     None                                         Age Indeterminate +---------+---------------+---------+-----------+----------+-----------------+ Soleal                                                Age Indeterminate +---------+---------------+---------+-----------+----------+-----------------+    Summary: Right: There is no evidence of deep vein thrombosis in the lower extremity. No cystic structure found in the popliteal fossa. Left: Findings consistent with age indeterminate deep vein thrombosis involving the left femoral vein, left popliteal vein, left posterior tibial vein, and left peroneal vein. No cystic structure found in the popliteal fossa.  *See table(s) above for measurements and observations. Electronically signed by Monica Martinez MD on 12/13/2017 at 6:08:36 PM.    Final     Microbiology: Recent Results (from  the past 240 hour(s))  Urine culture     Status: None   Collection Time: 12/11/17  1:10 AM  Result Value Ref Range Status   Specimen Description URINE, CATHETERIZED  Final   Special Requests NONE  Final   Culture   Final    NO GROWTH Performed at [facility], [address], [place], [place] [address]    Report Status 12/12/2017 FINAL  Final  Culture, blood (routine x 2) Call MD if unable to obtain prior to antibiotics being given     Status: None   Collection Time: 12/11/17 11:00 AM  Result Value Ref Range Status   Specimen Description BLOOD LEFT ANTECUBITAL  Final   Special Requests   Final    BOTTLES DRAWN AEROBIC AND ANAEROBIC Blood Culture adequate volume   Culture   Final    NO GROWTH 5 DAYS Performed at [facility]  [facility], [place] [address], [place], [place] [address]    Report Status 12/16/2017 FINAL  Final  Culture, blood (routine x 2) Call MD if unable to obtain prior to antibiotics being given     Status: None   Collection Time: 12/11/17 11:08 AM  Result Value Ref Range Status   Specimen Description BLOOD LEFT WRIST  Final   Special Requests   Final    BOTTLES DRAWN AEROBIC AND ANAEROBIC Blood Culture results may not be optimal due to an inadequate volume of blood received in culture bottles   Culture   Final    NO GROWTH 5 DAYS Performed at [facility], [place] [address] [place], [place] [address]    Report Status 12/16/2017 FINAL  Final  MRSA PCR Screening     Status: None   Collection Time: 12/11/17 11:31 PM  Result Value Ref Range Status   MRSA by PCR NEGATIVE NEGATIVE Final    Comment:        The GeneXpert MRSA Assay (FDA approved for NASAL specimens only), is one component of a comprehensive MRSA colonization surveillance program. It is not intended to diagnose MRSA infection nor to guide or monitor treatment for MRSA infections. Performed at [facility], [place] [address], [place],  [address]      Labs: Basic Metabolic  Panel: Recent Labs  Lab 12/10/17 2216 12/12/17 0353 12/12/17 1356 12/13/17 0344 12/15/17 0651  NA 133* 138 138 139 140  K 5.2* 5.0 5.1 4.2 4.1  CL 101 104 111 113* 109  CO2 22 23 16* 18* 19*  GLUCOSE 238* 184* 286* 156* 117*  BUN 39* 42* 45* 48* 40*  CREATININE 0.96 1.02* 1.33* 1.07* 0.87  CALCIUM 9.0 8.6* 7.3* 8.1* 8.4*  MG  --   --   --  2.1  --   PHOS  --   --   --  4.3  --    Liver Function Tests: Recent Labs  Lab 12/10/17 2216 12/12/17 1356  AST 28 333*  ALT 26 162*  ALKPHOS 69 145*  BILITOT 1.0 1.0  PROT 5.9* 4.4*  ALBUMIN 3.4* 2.5*   No results for input(s): LIPASE, AMYLASE in the last 168 hours. No results for input(s): AMMONIA in the last 168 hours. CBC: Recent Labs  Lab 12/10/17 2216 12/12/17 0353  12/13/17 0344 12/14/17 0325 12/15/17 0651 12/16/17 0539 12/17/17 0532  WBC 7.6 6.6   < > 7.6 9.3 6.6 6.3 7.2  NEUTROABS 6.4 5.0  --   --   --   --   --   --   HGB 13.5 13.0   < > 11.7* 11.4* 11.8* 12.8 13.8  HCT 43.5 41.3   < > 36.2 36.7 37.8 42.1 45.1  MCV 96.2 95.4   < > 95.0 95.6 96.7 96.8 97.4  PLT 189 172   < > 144* 193 180 266 249   < > = values in this interval not displayed.   Cardiac Enzymes: Recent Labs  Lab 12/12/17 1356 12/12/17 2119 12/13/17 0344  TROPONINI 0.05* 0.10* 0.14*   BNP: BNP (last 3 results) No results for input(s): BNP in the last 8760 hours.  ProBNP (last 3 results) No results for input(s): PROBNP in the last 8760 hours.  CBG: Recent Labs  Lab 12/16/17 1131 12/16/17 1604 12/16/17 2033 12/17/17 0756 12/17/17 1157  GLUCAP 140* 137* 149* 145* 146*       Signed:  Wilkin Lippy  [facility] 12/17/2017,  4:09 PM

## 2017-12-17 NOTE — Progress Notes (Signed)
   12/17/17 1300  Clinical Encounter Type  Visited With Patient and family together  Visit Type Follow-up  Responded to [facility] consult for spiritual support. Patient actively dying and son and niece is at bedside. Provided scripture, prayer and song. Will follow-up as needed

## 2017-12-17 NOTE — Progress Notes (Signed)
Patient will be discharged to [facility] and report has being called and given to Hugo. [facility] has being called. Patient is 25th in line to be transported.[place] [name]

## 2017-12-18 NOTE — Consult Note (Signed)
            [facility] CM Primary Care Navigator  12/18/2017  [name] [birth year] [id number]   [name] patient at the bedside to identify possible discharge needs but she was already discharge to a hospice facility.   Per MD note, patient presented with altered mental status and fatigue. Patient was admitted for acute encephalopathy secondary to community-acquired pneumonia with superimposed on baseline dementia. Patient suffered respiratory arrest and required intubation. Palliative care consulted withplan for residential hospice, patient was placed on comfort medsand she remained on DNR status.  Discharge disposition: Patient was discharged to [facility].   No furtheridentifiable [facility] Care Management needsat this point.   For additional questions please contact:  Edwena Felty A. Gokul Waybright, BSN, RN-BC [facility] PRIMARY CARE Navigator Cell: [phone]

## 2018-01-13 DEATH — deceased

## 2019-11-27 IMAGING — CT CT ABD-PELV W/O CM
1 of 2 series · 13 of 32 positions shown, 19 images · non-contrast
Comparison: 05/04/2015

CLINICAL DATA: Altered mental status and fatigue

EXAM:
CT ABDOMEN AND PELVIS WITHOUT CONTRAST
TECHNIQUE: Multidetector CT imaging of the abdomen and pelvis was performed
following the standard protocol without IV contrast.

[Series 3: a/p w/o 5mm · axial · non-contrast · 0.78mm/px · z∈[+954,+1330]mm · 13 of 87 slices shown, 19 images]
[im 6/87  soft-tissue]
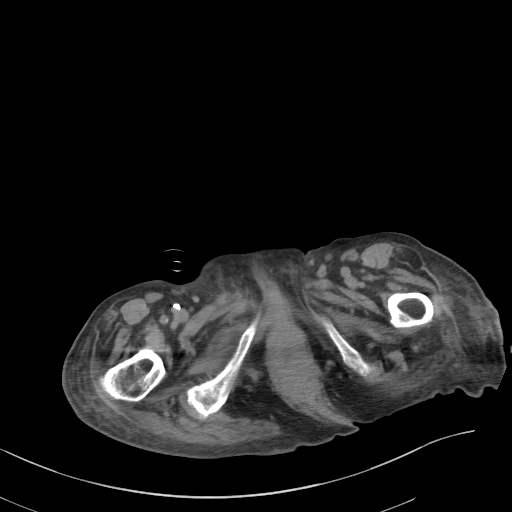
[im 6/87  bone]
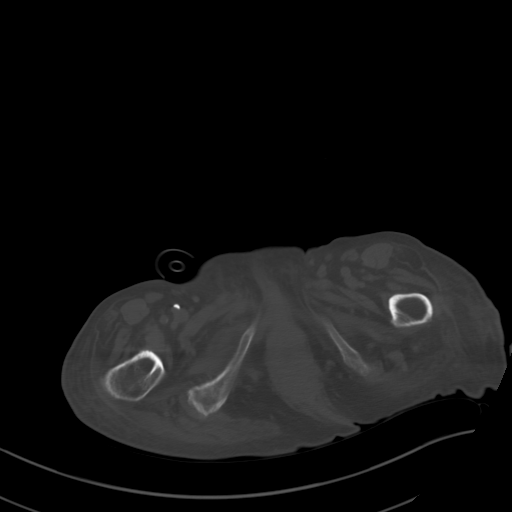
[im 12/87  soft-tissue]
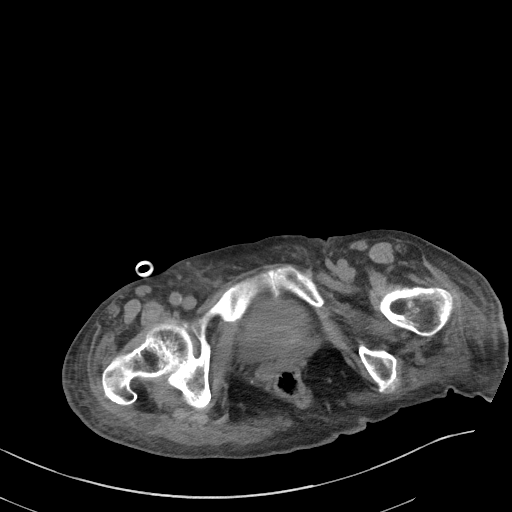
[im 18/87  soft-tissue]
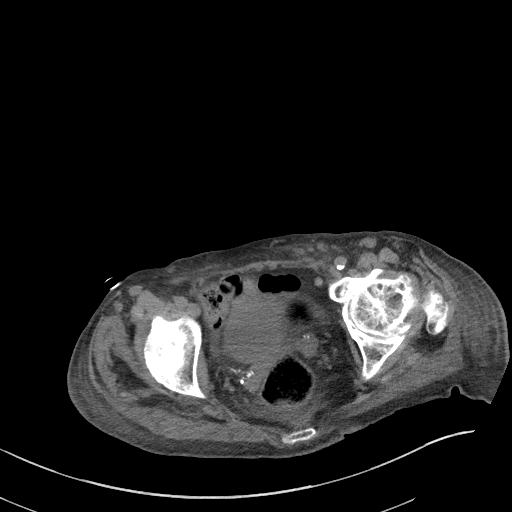
[im 23/87  soft-tissue]
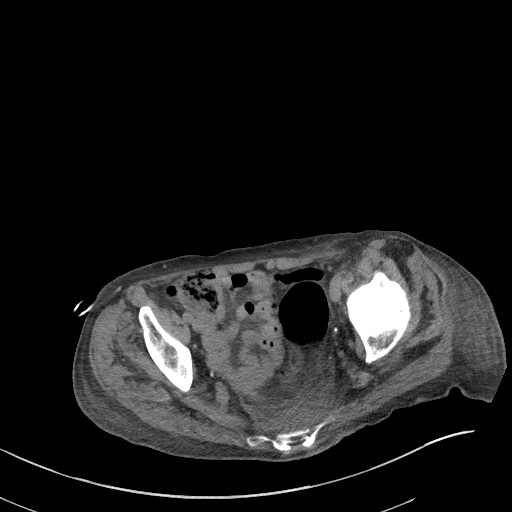
[im 29/87  soft-tissue]
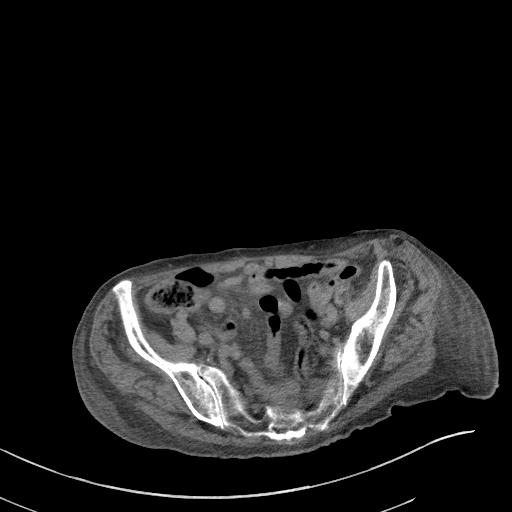
[im 35/87  soft-tissue]
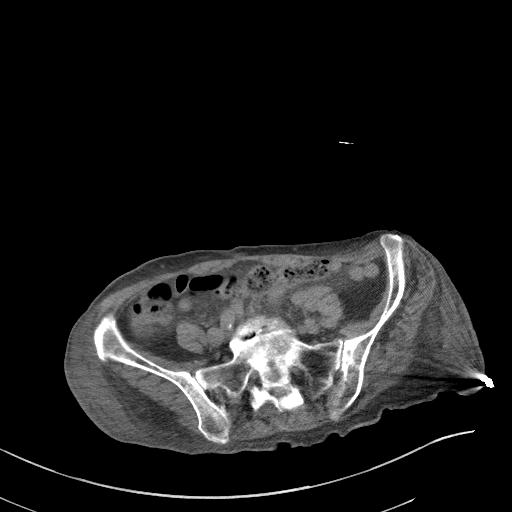
[im 46/87  soft-tissue]
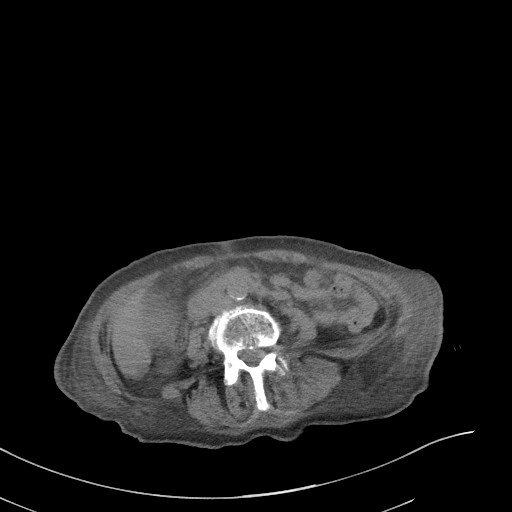
[im 52/87  soft-tissue]
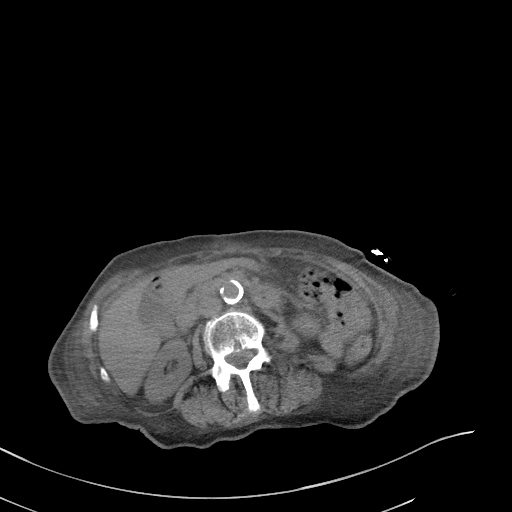
[im 58/87  soft-tissue]
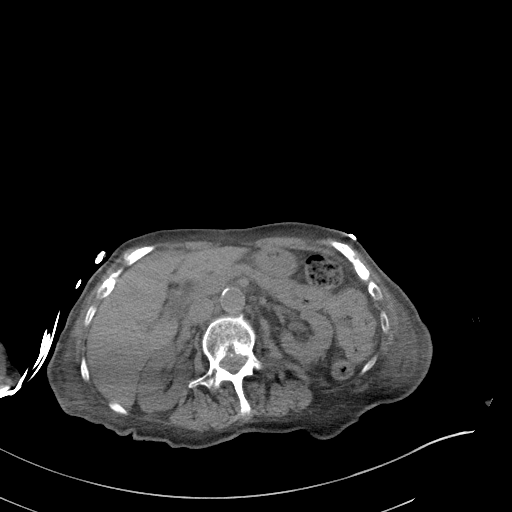
[im 58/87  bone]
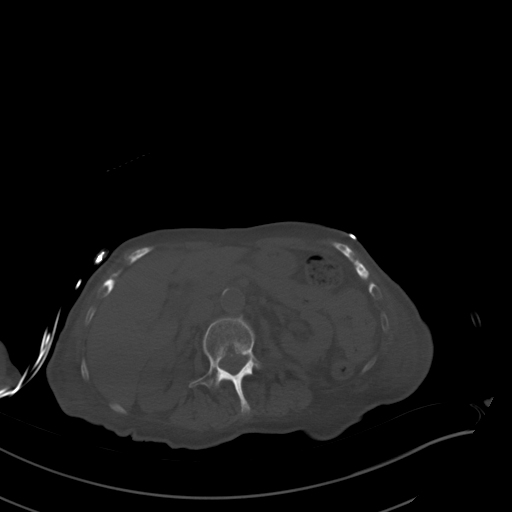
[im 64/87  soft-tissue]
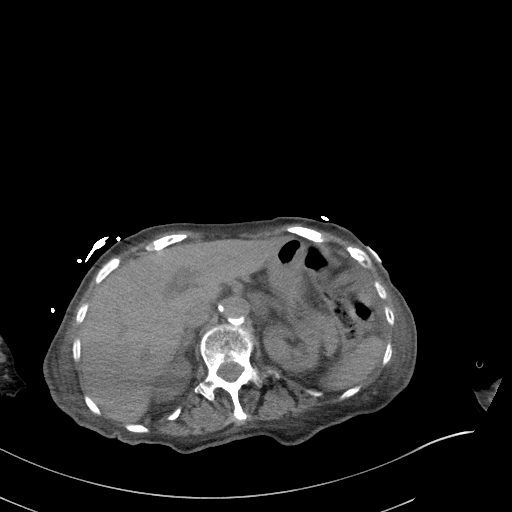
[im 64/87  lung]
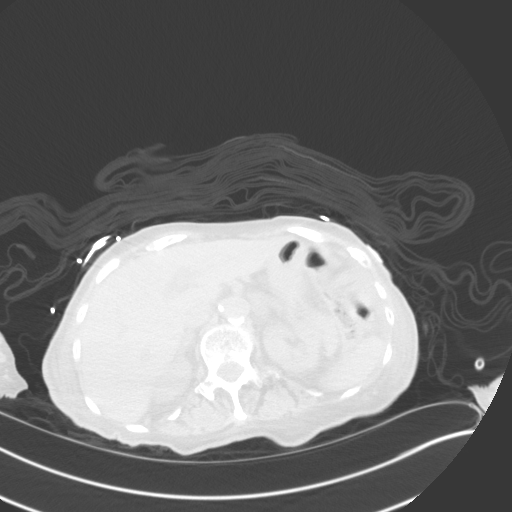
[im 69/87  soft-tissue]
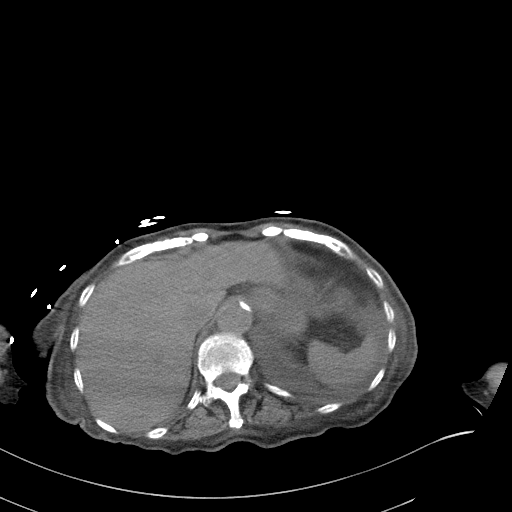
[im 69/87  lung]
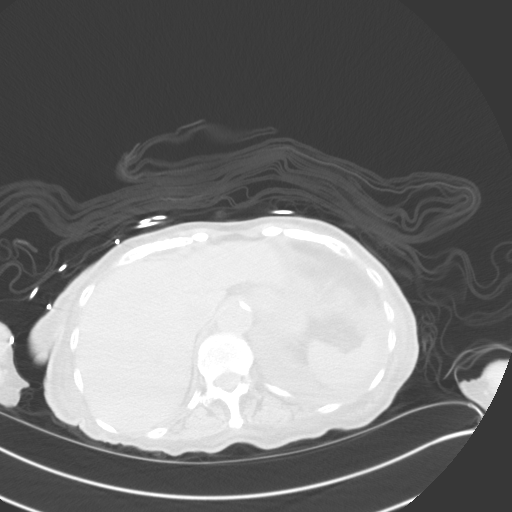
[im 75/87  soft-tissue]
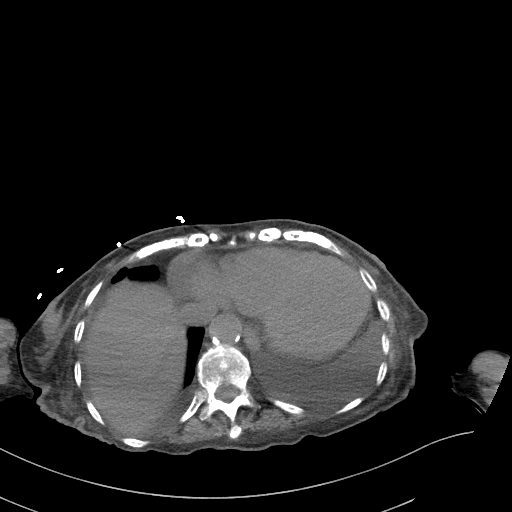
[im 75/87  lung]
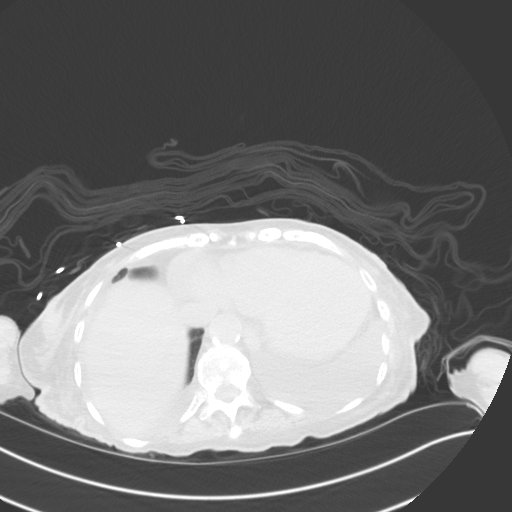
[im 81/87  soft-tissue]
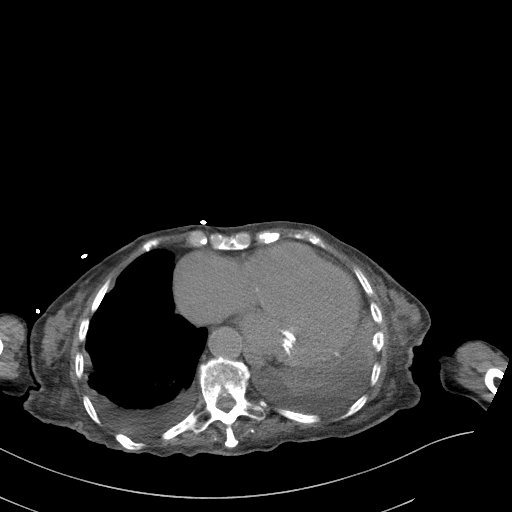
[im 81/87  lung]
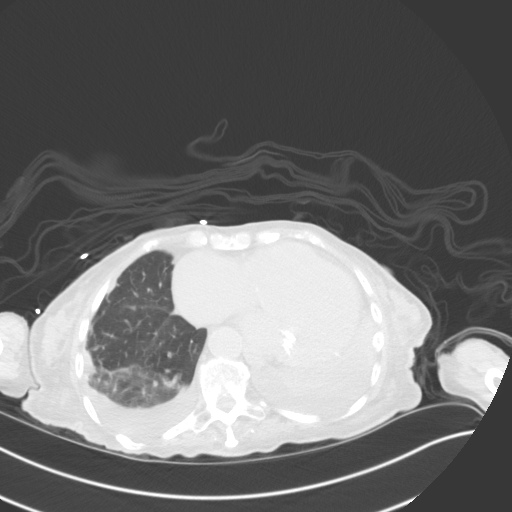

[13 of 32 positions shown; findings below may reference images not displayed]

FINDINGS: Lower chest: Lung bases demonstrate evidence of bilateral pleural
effusions right greater than left. Associated right lower lobe
infiltrate is noted. Some patchy infiltrative changes are noted in
the right lower lobe as well. Cardiomegaly is noted with coronary
calcifications.

Hepatobiliary: Some dependent density is noted within the
gallbladder likely representing sludge. No definitive stones are
seen. The liver is within normal limits.

Pancreas: Unremarkable. No pancreatic ductal dilatation or
surrounding inflammatory changes.

Spleen: Normal in size without focal abnormality.

Adrenals/Urinary Tract: Adrenal glands are within normal limits. No
renal calculi or obstructive changes are seen. The ureters are
within normal limits. The bladder is partially distended.

Stomach/Bowel: Scattered diverticular changes noted within the
colon. No evidence of diverticulitis is seen. The appendix has been
surgically removed no other obstructive or inflammatory changes are
seen.

Vascular/Lymphatic: Aortic atherosclerosis. No enlarged abdominal or
pelvic lymph nodes.

Reproductive: Status post hysterectomy. No adnexal masses.

Other: Minimal free fluid is noted within the pelvis of uncertain
significance. Mild changes suggesting anasarca are noted as well.

Musculoskeletal: Degenerative changes of the lumbar spine are noted.
No compression deformities are seen.
IMPRESSION: Bilateral pleural effusions and lower lobe infiltrative changes left
worse than right.

Minimal ascites and mild changes of anasarca.

Diverticulosis without diverticulitis.

## 2019-11-28 IMAGING — CT CT ANGIO CHEST
2 of 6 series · 18 of 36 positions shown · IV contrast (iopamidol)
Comparison: Chest CT 03/03/2011 and chest radiograph 12/12/2017

CLINICAL DATA: Respiratory failure.  Negative DVT study.

EXAM:
CT ANGIOGRAPHY CHEST WITH CONTRAST
TECHNIQUE: Multidetector CT imaging of the chest was performed using the
standard protocol during bolus administration of intravenous
contrast. Multiplanar CT image reconstructions and MIPs were
obtained to evaluate the vascular anatomy.
CONTRAST:  75mL [id number] IOPAMIDOL ([id number]) INJECTION 76%

[Series 4: thins · axial · 0.72mm/px · z∈[-964,-690]mm · 17 of 304 slices shown]
[im 16/304  lung]
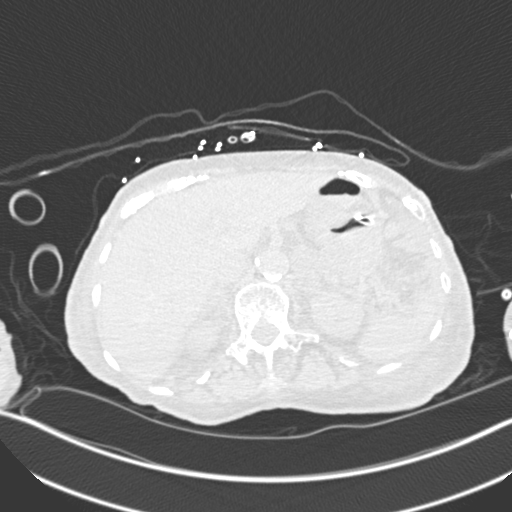
[im 31/304  mediastinal]
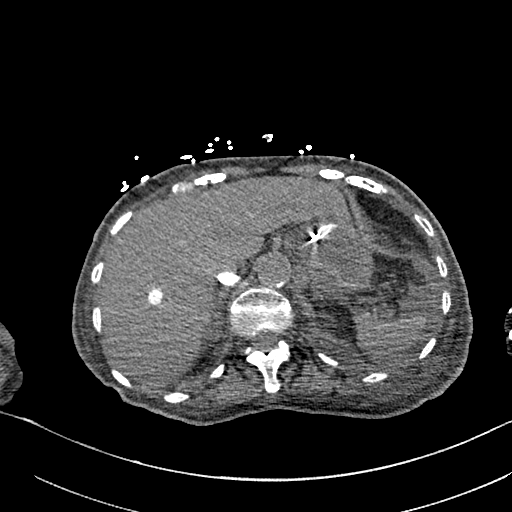
[im 46/304  lung]
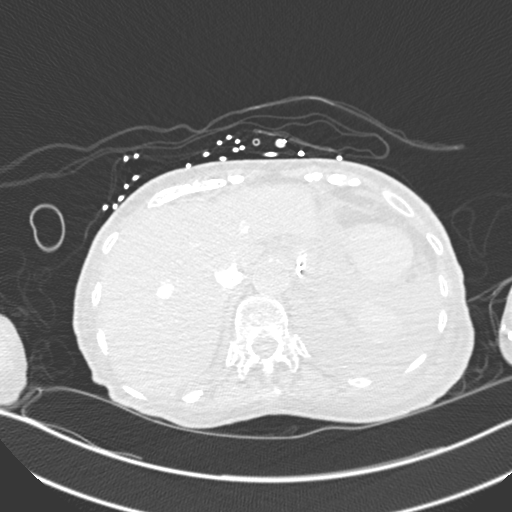
[im 61/304  mediastinal]
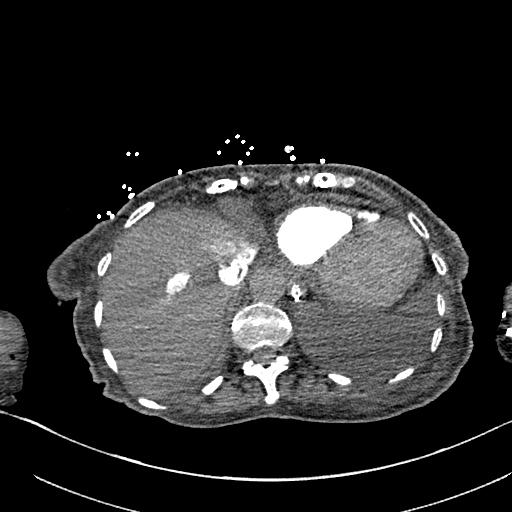
[im 91/304  lung]
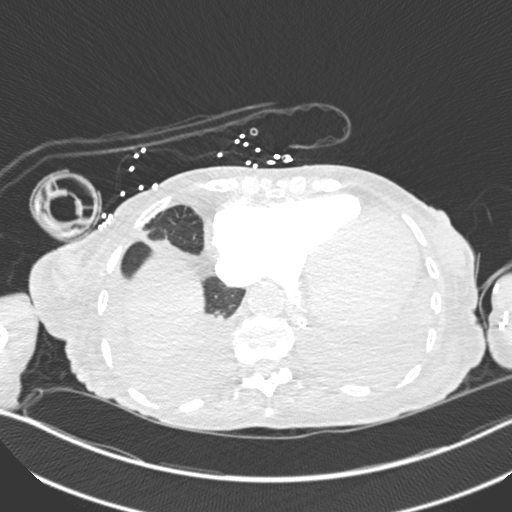
[im 107/304  mediastinal]
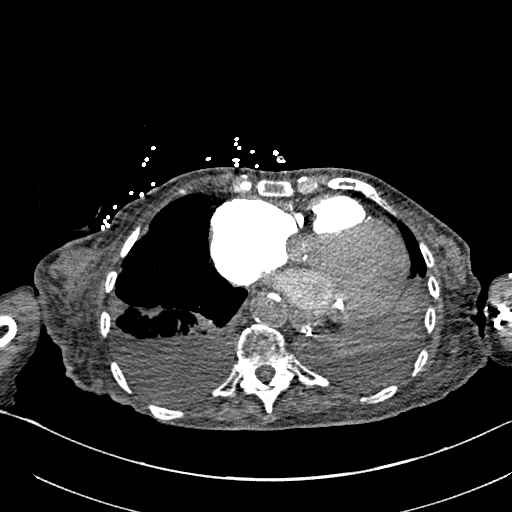
[im 122/304  lung]
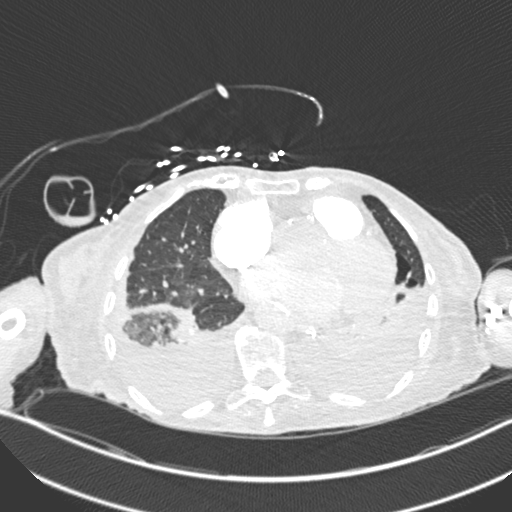
[im 137/304  mediastinal]
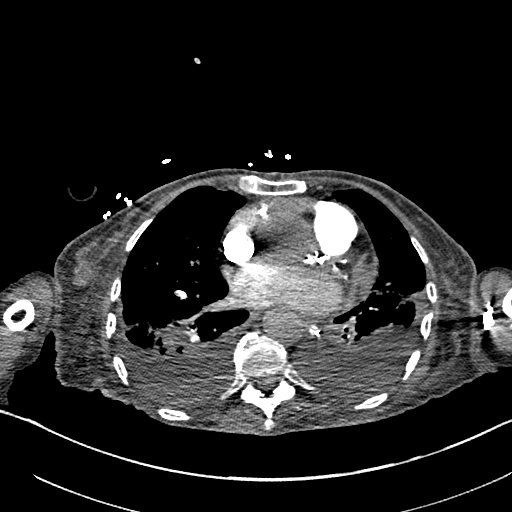
[im 152/304  lung]
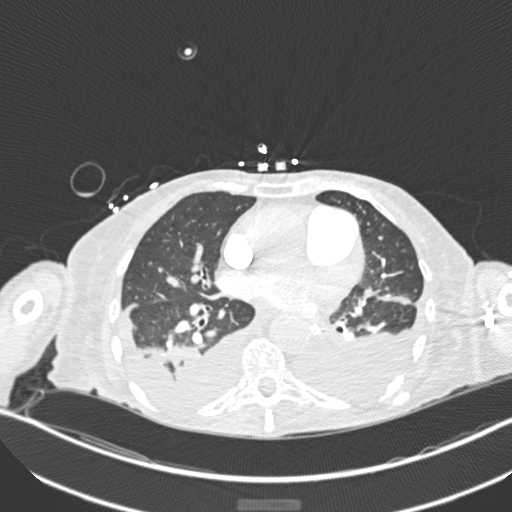
[im 167/304  mediastinal]
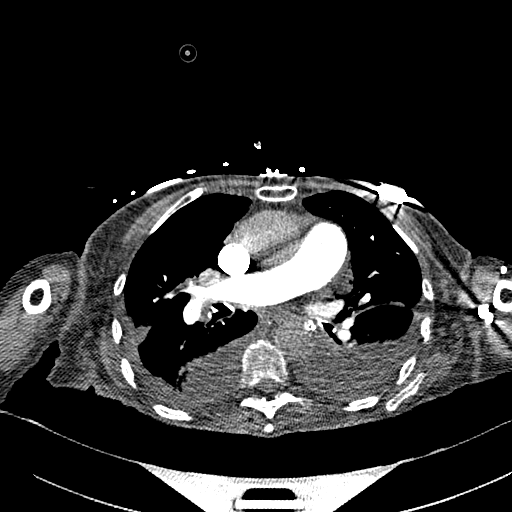
[im 182/304  lung]
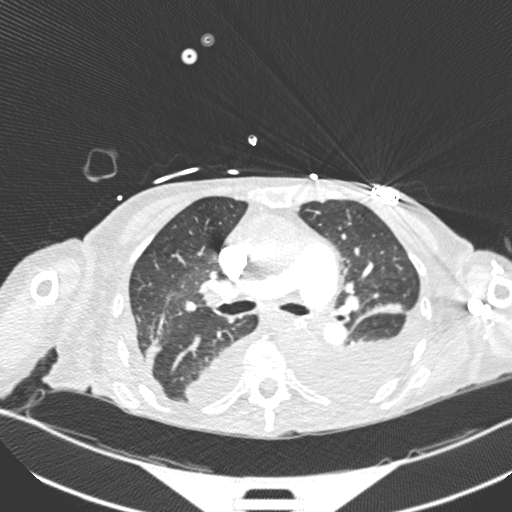
[im 197/304  mediastinal]
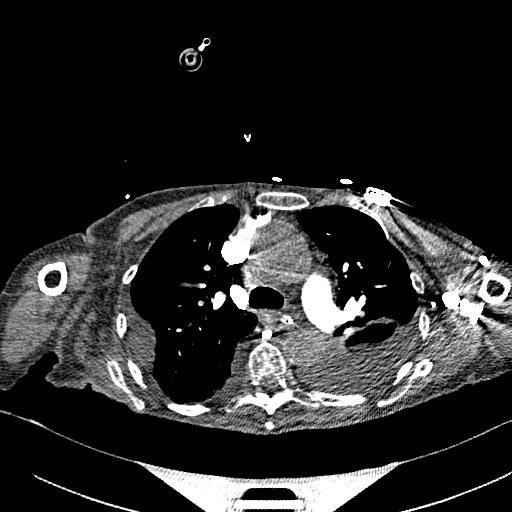
[im 213/304  lung]
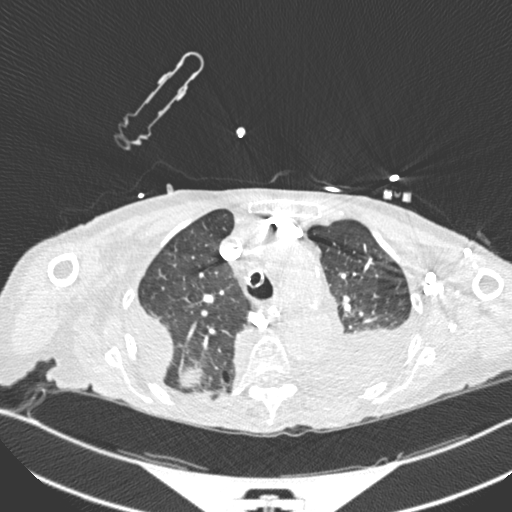
[im 243/304  mediastinal]
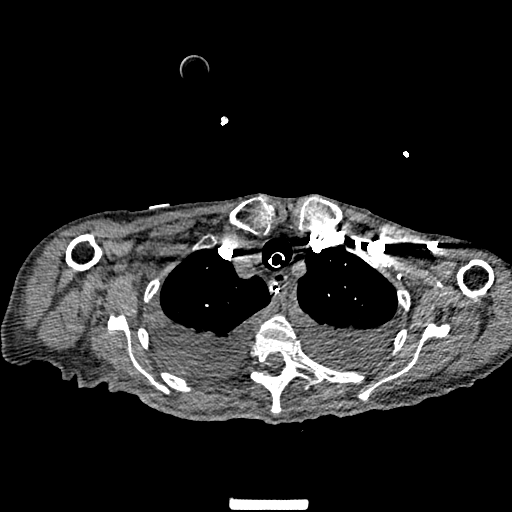
[im 258/304  lung]
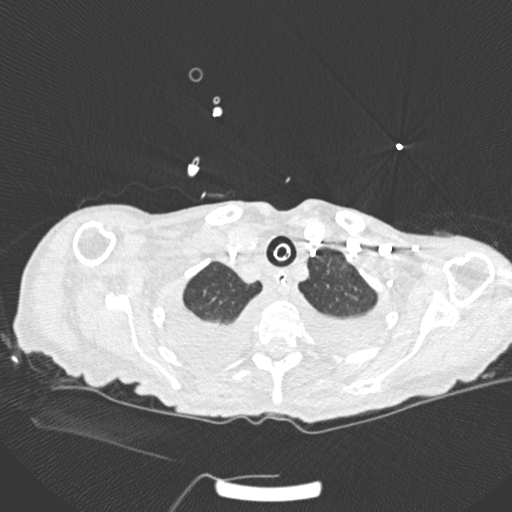
[im 273/304  mediastinal]
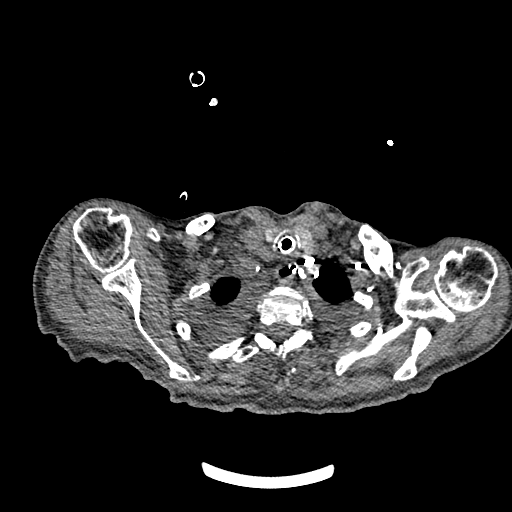
[im 288/304  lung]
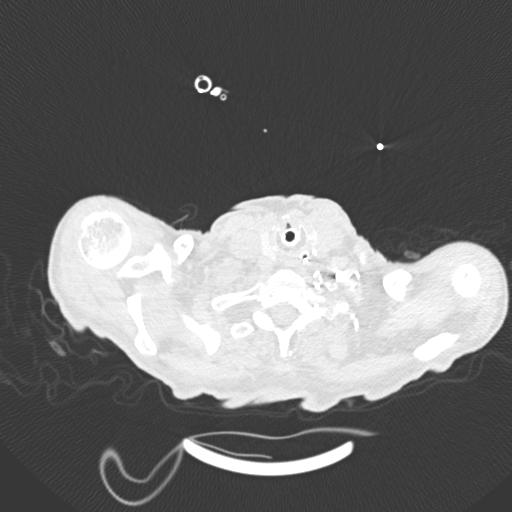

[Series 6: coronal mpr · coronal · 0.60mm/px · 1 of 106 slices shown]
[im 53/106  mediastinal]
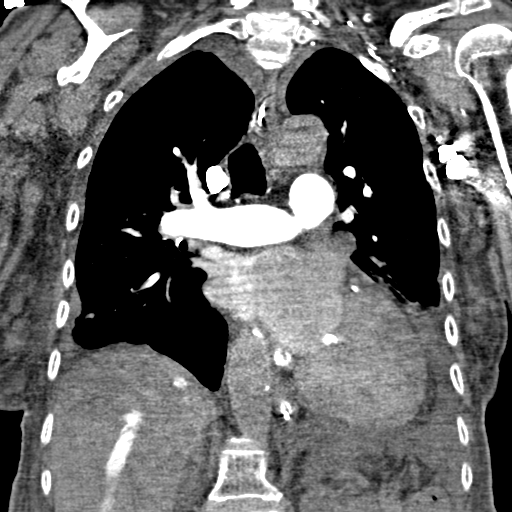

[18 of 36 positions shown; findings below may reference images not displayed]

FINDINGS: Cardiovascular:

--Pulmonary arteries: Contrast injection is sufficient to
demonstrate satisfactory opacification of the pulmonary arteries to
the segmental level.There are bilateral pulmonary emboli. The
greatest burden is in the proximal right middle lobar branches.
There are smaller embolic burns in the right upper and lower lobe
segmental branches. On the left, there is a small filling defect
within the artery of the lingula. The main pulmonary artery is
within normal limits for size.

--Aorta: Limited opacification of the aorta due to bolus timing
optimization for the pulmonary arteries. The aortic course and
caliber are normal. There is mild aortic atherosclerosis.

--Heart: Normal size. No pericardial effusion.

Mediastinum/Nodes: No mediastinal, hilar or axillary
lymphadenopathy. The visualized thyroid and thoracic esophageal
course are unremarkable.

Lungs/Pleura: Endotracheal tube tip is 1.8 cm above the inferior
margin of the carina. There are large pleural effusions with
associated atelectasis. There is a right lateral loculated
component.

Upper Abdomen: Contrast bolus timing is not optimized for evaluation
of the abdominal organs. Within this limitation, the visualized
organs of the upper abdomen are normal.

Musculoskeletal: No chest wall abnormality. No acute or significant
osseous findings.

Review of the MIP images confirms the above findings.
IMPRESSION: 1. Bilateral pulmonary emboli, greatest in the right middle lobe
branches, but also affecting the right upper and lower lobes and the
lingula.
2. No CT evidence of right heart strain.
3. Large pleural effusions with associated atelectasis. The right
effusion is likely loculated.
4.  Aortic Atherosclerosis ([id number]).

Critical Value/emergent results were called by telephone at the time
of interpretation on 12/12/2017 at [DATE] to Dr. GOJARWAL , who
verbally acknowledged these results.
